# Patient Record
Sex: Female | Born: 1980 | Race: White | Hispanic: No | Marital: Married | Smoking: Never smoker
Health system: Southern US, Community
[De-identification: ages and names within clinical notes are randomized; demographics above are authoritative.]

## PROBLEM LIST (undated history)

## (undated) DIAGNOSIS — R7309 Other abnormal glucose: Secondary | ICD-10-CM

## (undated) DIAGNOSIS — O24419 Gestational diabetes mellitus in pregnancy, unspecified control: Secondary | ICD-10-CM

## (undated) DIAGNOSIS — C50919 Malignant neoplasm of unspecified site of unspecified female breast: Secondary | ICD-10-CM

## (undated) DIAGNOSIS — N159 Renal tubulo-interstitial disease, unspecified: Secondary | ICD-10-CM

## (undated) DIAGNOSIS — D649 Anemia, unspecified: Secondary | ICD-10-CM

## (undated) DIAGNOSIS — T8859XA Other complications of anesthesia, initial encounter: Secondary | ICD-10-CM

## (undated) DIAGNOSIS — E039 Hypothyroidism, unspecified: Secondary | ICD-10-CM

## (undated) DIAGNOSIS — J189 Pneumonia, unspecified organism: Secondary | ICD-10-CM

## (undated) DIAGNOSIS — T4145XA Adverse effect of unspecified anesthetic, initial encounter: Secondary | ICD-10-CM

## (undated) DIAGNOSIS — R112 Nausea with vomiting, unspecified: Secondary | ICD-10-CM

## (undated) DIAGNOSIS — Z9889 Other specified postprocedural states: Secondary | ICD-10-CM

## (undated) DIAGNOSIS — Z923 Personal history of irradiation: Secondary | ICD-10-CM

## (undated) HISTORY — DX: Malignant neoplasm of unspecified site of unspecified female breast: C50.919

## (undated) HISTORY — PX: APPENDECTOMY: SHX54

## (undated) HISTORY — PX: WISDOM TOOTH EXTRACTION: SHX21

## (undated) HISTORY — PX: OTHER SURGICAL HISTORY: SHX169

## (undated) HISTORY — PX: BREAST LUMPECTOMY: SHX2

---
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
## 2007-01-20 HISTORY — PX: PELVIC LAPAROSCOPY: SHX162

## 2017-03-19 ENCOUNTER — Ambulatory Visit: Payer: PRIVATE HEALTH INSURANCE | Admitting: Certified Nurse Midwife

## 2017-03-19 ENCOUNTER — Encounter: Payer: Self-pay | Admitting: Certified Nurse Midwife

## 2017-03-19 VITALS — BP 122/79 | HR 88 | Ht 69.0 in | Wt 215.7 lb

## 2017-03-19 DIAGNOSIS — L680 Hirsutism: Secondary | ICD-10-CM

## 2017-03-19 DIAGNOSIS — N920 Excessive and frequent menstruation with regular cycle: Secondary | ICD-10-CM | POA: Diagnosis not present

## 2017-03-19 DIAGNOSIS — Z8349 Family history of other endocrine, nutritional and metabolic diseases: Secondary | ICD-10-CM

## 2017-03-19 DIAGNOSIS — Z808 Family history of malignant neoplasm of other organs or systems: Secondary | ICD-10-CM | POA: Diagnosis not present

## 2017-03-19 DIAGNOSIS — L659 Nonscarring hair loss, unspecified: Secondary | ICD-10-CM

## 2017-03-19 DIAGNOSIS — Z6831 Body mass index (BMI) 31.0-31.9, adult: Secondary | ICD-10-CM | POA: Diagnosis not present

## 2017-03-19 DIAGNOSIS — L608 Other nail disorders: Secondary | ICD-10-CM

## 2017-03-19 DIAGNOSIS — L853 Xerosis cutis: Secondary | ICD-10-CM | POA: Diagnosis not present

## 2017-03-19 DIAGNOSIS — R6889 Other general symptoms and signs: Secondary | ICD-10-CM

## 2017-03-19 DIAGNOSIS — L709 Acne, unspecified: Secondary | ICD-10-CM

## 2017-03-19 NOTE — Progress Notes (Signed)
Pt would like thyroid blood work done due to several members of her family having thyroid issues. Pt is having heavy cycles and changing tampons and pads every 20-30 mins the first 2 days and then it slows down and lasts 7 days.

## 2017-03-19 NOTE — Patient Instructions (Signed)
Preventive Care 18-39 Years, Female Preventive care refers to lifestyle choices and visits with your health care provider that can promote health and wellness. What does preventive care include?  A yearly physical exam. This is also called an annual well check.  Dental exams once or twice a year.  Routine eye exams. Ask your health care provider how often you should have your eyes checked.  Personal lifestyle choices, including: ? Daily care of your teeth and gums. ? Regular physical activity. ? Eating a healthy diet. ? Avoiding tobacco and drug use. ? Limiting alcohol use. ? Practicing safe sex. ? Taking vitamin and mineral supplements as recommended by your health care provider. What happens during an annual well check? The services and screenings done by your health care provider during your annual well check will depend on your age, overall health, lifestyle risk factors, and family history of disease. Counseling Your health care provider may ask you questions about your:  Alcohol use.  Tobacco use.  Drug use.  Emotional well-being.  Home and relationship well-being.  Sexual activity.  Eating habits.  Work and work Statistician.  Method of birth control.  Menstrual cycle.  Pregnancy history.  Screening You may have the following tests or measurements:  Height, weight, and BMI.  Diabetes screening. This is done by checking your blood sugar (glucose) after you have not eaten for a while (fasting).  Blood pressure.  Lipid and cholesterol levels. These may be checked every 5 years starting at age 66.  Skin check.  Hepatitis C blood test.  Hepatitis B blood test.  Sexually transmitted disease (STD) testing.  BRCA-related cancer screening. This may be done if you have a family history of breast, ovarian, tubal, or peritoneal cancers.  Pelvic exam and Pap test. This may be done every 3 years starting at age 40. Starting at age 59, this may be done every 5  years if you have a Pap test in combination with an HPV test.  Discuss your test results, treatment options, and if necessary, the need for more tests with your health care provider. Vaccines Your health care provider may recommend certain vaccines, such as:  Influenza vaccine. This is recommended every year.  Tetanus, diphtheria, and acellular pertussis (Tdap, Td) vaccine. You may need a Td booster every 10 years.  Varicella vaccine. You may need this if you have not been vaccinated.  HPV vaccine. If you are 69 or younger, you may need three doses over 6 months.  Measles, mumps, and rubella (MMR) vaccine. You may need at least one dose of MMR. You may also need a second dose.  Pneumococcal 13-valent conjugate (PCV13) vaccine. You may need this if you have certain conditions and were not previously vaccinated.  Pneumococcal polysaccharide (PPSV23) vaccine. You may need one or two doses if you smoke cigarettes or if you have certain conditions.  Meningococcal vaccine. One dose is recommended if you are age 27-21 years and a first-year college student living in a residence hall, or if you have one of several medical conditions. You may also need additional booster doses.  Hepatitis A vaccine. You may need this if you have certain conditions or if you travel or work in places where you may be exposed to hepatitis A.  Hepatitis B vaccine. You may need this if you have certain conditions or if you travel or work in places where you may be exposed to hepatitis B.  Haemophilus influenzae type b (Hib) vaccine. You may need this if  you have certain risk factors.  Talk to your health care provider about which screenings and vaccines you need and how often you need them. This information is not intended to replace advice given to you by your health care provider. Make sure you discuss any questions you have with your health care provider. Document Released: 03/03/2001 Document Revised: 09/25/2015  Document Reviewed: 11/06/2014 Elsevier Interactive Patient Education  [employer].

## 2017-03-23 NOTE — Progress Notes (Signed)
GYN ENCOUNTER NOTE  Subjective:       Gabriela Evans is a 37 y.o. G2P1000 female here to establish care and for gynecologic evaluation of the following issues:  1. Heavy periods 2. Hirsutism 3. Pitting fingernails 4. Thinning hair 5. Acne 6. Dry skin 7. Cold feeling after eating 8. Sore joints  She has noticed this symptoms progressing over the last few months. Concerned due to family history. Which includes: thyroid cancer, Hashimoto thyroiditis, hypothyroidism, hyperthyroidism, prostate cancer, and melanoma.   Denies difficulty breathing or respiratory distress, chest pain, abdominal pain, dysuria and leg pain or swelling.   Requests lab work today including thyroid antibody.    Gynecologic History  Patient's last menstrual period was 02/24/2017.  Last Pap: 08/2016. Results were: normal  Obstetric History  OB History  Gravida Para Term Preterm AB Living  2 2 1         SAB TAB Ectopic Multiple Live Births               # Outcome Date GA Lbr Len/2nd Weight Sex Delivery Anes PTL Lv  2 Term 2011    F CS-Unspec     1 Para 2009    M CS-Unspec         History reviewed. No pertinent past medical history.  Past Surgical History:  Procedure Laterality Date  . CESAREAN SECTION      Current Outpatient Medications on File Prior to Visit  Medication Sig Dispense Refill  . cholecalciferol (VITAMIN D) 1000 units tablet Take 1,000 Units by mouth daily.    . ferrous sulfate 325 (65 FE) MG tablet Take 325 mg by mouth daily with breakfast.    . Multiple Vitamins-Minerals (MULTIVITAMIN WITH MINERALS) tablet Take 1 tablet by mouth daily.     No current facility-administered medications on file prior to visit.     Not on File  Social History   Socioeconomic History  . Marital status: Divorced    Spouse name: Not on file  . Number of children: Not on file  . Years of education: Not on file  . Highest education level: Not on file  Social Needs  . Financial resource strain:  Not on file  . Food insecurity - worry: Not on file  . Food insecurity - inability: Not on file  . Transportation needs - medical: Not on file  . Transportation needs - non-medical: Not on file  Occupational History  . Not on file  Tobacco Use  . Smoking status: Never Smoker  . Smokeless tobacco: Never Used  Substance and Sexual Activity  . Alcohol use: Yes    Frequency: Never    Comment: occas  . Drug use: No  . Sexual activity: No    Birth control/protection: None  Other Topics Concern  . Not on file  Social History Narrative  . Not on file    Family History  Problem Relation Age of Onset  . Diabetes Mother   . Thyroid disease Father   . Diabetes Father   . Thyroid disease Sister   . Thyroid disease Maternal Aunt   . Cancer Paternal Aunt     The following portions of the patient's history were reviewed and updated as appropriate: allergies, current medications, past family history, past medical history, past social history, past surgical history and problem list.  Review of Systems  Review of Systems - Negative except as noted above.  History obtained from the patient.   Objective:   BP 122/79  Pulse 88   Ht 5\' 9"  (1.753 m)   Wt 215 lb 11.2 oz (97.8 kg)   LMP 02/24/2017   BMI 31.85 kg/m   CONSTITUTIONAL: Well-developed, well-nourished female in no acute distress.   HENT:  Normocephalic, atraumatic.   NECK: Normal range of motion, supple, no masses.   Normal thyroid.   [place]: Alert and oriented to person, place, and time.   PSYCHIATRIC: Normal mood and affect. Normal behavior. Normal judgment and thought content.  CARDIOVASCULAR: normal rate and regular rhythm.  RESPIRATORY: Normal respiratory effort, chest expands symmetrically. Lungs are clear to auscultation, no crackles or wheezes.   Assessment:   1. Menorrhagia with regular cycle  - DHEA-sulfate - Testosterone, Free, Total, SHBG - Progesterone - Thyroid Panel With TSH - Vitamin D  1,25 dihydroxy - FSH/LH - Estradiol - Hemoglobin A1c - Prolactin - CBC - Thyroid peroxidase antibody  2. Family history of thyroid cancer  - DHEA-sulfate - Testosterone, Free, Total, SHBG - Progesterone - Thyroid Panel With TSH - Vitamin D 1,25 dihydroxy - FSH/LH - Estradiol - Hemoglobin A1c - Prolactin - CBC - Thyroid peroxidase antibody  3. Family history of Hashimoto thyroiditis  - DHEA-sulfate - Testosterone, Free, Total, SHBG - Progesterone - Thyroid Panel With TSH - Vitamin D 1,25 dihydroxy - FSH/LH - Estradiol - Hemoglobin A1c - Prolactin - CBC - Thyroid peroxidase antibody  4. Family history of hypothyroidism  - DHEA-sulfate - Testosterone, Free, Total, SHBG - Progesterone - Thyroid Panel With TSH - Vitamin D 1,25 dihydroxy - FSH/LH - Estradiol - Hemoglobin A1c - Prolactin - CBC - Thyroid peroxidase antibody  5. Family history of hyperthyroidism  - DHEA-sulfate - Testosterone, Free, Total, SHBG - Progesterone - Thyroid Panel With TSH - Vitamin D 1,25 dihydroxy - FSH/LH - Estradiol - Hemoglobin A1c - Prolactin - CBC - Thyroid peroxidase antibody  6. Hirsutism  - DHEA-sulfate - Testosterone, Free, Total, SHBG - Progesterone - Thyroid Panel With TSH - Vitamin D 1,25 dihydroxy - FSH/LH - Estradiol - Hemoglobin A1c - Prolactin - CBC - Thyroid peroxidase antibody  7. Pitting of nails  - DHEA-sulfate - Testosterone, Free, Total, SHBG - Progesterone - Thyroid Panel With TSH - Vitamin D 1,25 dihydroxy - FSH/LH - Estradiol - Hemoglobin A1c - Prolactin - CBC - Thyroid peroxidase antibody  8. Thinning hair  - DHEA-sulfate - Testosterone, Free, Total, SHBG - Progesterone - Thyroid Panel With TSH - Vitamin D 1,25 dihydroxy - FSH/LH - Estradiol - Hemoglobin A1c - Prolactin - CBC - Thyroid peroxidase antibody  9. Acne, unspecified acne type  - DHEA-sulfate - Testosterone, Free, Total, SHBG - Progesterone - Thyroid  Panel With TSH - Vitamin D 1,25 dihydroxy - FSH/LH - Estradiol - Hemoglobin A1c - Prolactin - CBC - Thyroid peroxidase antibody  10. Dry skin  - DHEA-sulfate - Testosterone, Free, Total, SHBG - Progesterone - Thyroid Panel With TSH - Vitamin D 1,25 dihydroxy - FSH/LH - Estradiol - Hemoglobin A1c - Prolactin - CBC - Thyroid peroxidase antibody  11. Cold feeling  - DHEA-sulfate - Testosterone, Free, Total, SHBG - Progesterone - Thyroid Panel With TSH - Vitamin D 1,25 dihydroxy - FSH/LH - Estradiol - Hemoglobin A1c - Prolactin - CBC - Thyroid peroxidase antibody  12. BMI 31.0-31.9,adult  - DHEA-sulfate - Testosterone, Free, Total, SHBG - Progesterone - Thyroid Panel With TSH - Vitamin D 1,25 dihydroxy - FSH/LH - Estradiol - Hemoglobin A1c - Prolactin - CBC - Thyroid peroxidase antibody  Plan:   Labs: see  orders. Will contact patient with results.   Reviewed red flag symptoms and when to call.   RTC as needed.    Lara Mulch Mariell Nester,CNM [facility]   A total of 20 minutes were spent face-to-face with the patient during the encounter with greater than 50% dealing with counseling and coordination of care.

## 2017-03-24 LAB — PROGESTERONE: PROGESTERONE: 12.6 ng/mL

## 2017-03-24 LAB — CBC
HEMATOCRIT: 37.5 % (ref 34.0–46.6)
HEMOGLOBIN: 12.3 g/dL (ref 11.1–15.9)
MCH: 28.7 pg (ref 26.6–33.0)
MCHC: 32.8 g/dL (ref 31.5–35.7)
MCV: 87 fL (ref 79–97)
Platelets: 283 10*3/uL (ref 150–379)
RBC: 4.29 x10E6/uL (ref 3.77–5.28)
RDW: 13.3 % (ref 12.3–15.4)
WBC: 8.3 10*3/uL (ref 3.4–10.8)

## 2017-03-24 LAB — VITAMIN D 1,25 DIHYDROXY
VITAMIN D 1, 25 (OH) TOTAL: 61 pg/mL
Vitamin D2 1, 25 (OH)2: <10 pg/mL
Vitamin D3 1, 25 (OH)2: 55 pg/mL

## 2017-03-24 LAB — FSH/LH
FSH: 3.4 m[IU]/mL
LH: 2.4 m[IU]/mL

## 2017-03-24 LAB — THYROID PEROXIDASE ANTIBODY: THYROID PEROXIDASE ANTIBODY: 7 [IU]/mL (ref 0–34)

## 2017-03-24 LAB — HEMOGLOBIN A1C
Est. average glucose Bld gHb Est-mCnc: 111 mg/dL
HEMOGLOBIN A1C: 5.5 % (ref 4.8–5.6)

## 2017-03-24 LAB — THYROID PANEL WITH TSH
Free Thyroxine Index: 1.4 (ref 1.2–4.9)
T3 Uptake Ratio: 21 % — ABNORMAL LOW (ref 24–39)
T4, Total: 6.7 ug/dL (ref 4.5–12.0)
TSH: 3.14 u[IU]/mL (ref 0.450–4.500)

## 2017-03-24 LAB — PROLACTIN: PROLACTIN: 6.4 ng/mL (ref 4.8–23.3)

## 2017-03-24 LAB — ESTRADIOL: ESTRADIOL: 122.6 pg/mL

## 2017-03-24 LAB — TESTOSTERONE, FREE, TOTAL, SHBG
Sex Hormone Binding: 54.8 nmol/L (ref 24.6–122.0)
TESTOSTERONE FREE: 0.7 pg/mL (ref 0.0–4.2)
Testosterone: 4 ng/dL — ABNORMAL LOW (ref 8–48)

## 2017-03-24 LAB — DHEA-SULFATE: DHEA-SO4: 102.3 ug/dL (ref 84.8–378.0)

## 2017-03-30 ENCOUNTER — Encounter: Payer: Self-pay | Admitting: Certified Nurse Midwife

## 2017-05-08 ENCOUNTER — Encounter: Payer: Self-pay | Admitting: Certified Nurse Midwife

## 2017-05-11 ENCOUNTER — Other Ambulatory Visit: Payer: Self-pay | Admitting: Certified Nurse Midwife

## 2017-05-11 DIAGNOSIS — N920 Excessive and frequent menstruation with regular cycle: Secondary | ICD-10-CM

## 2017-05-11 NOTE — Telephone Encounter (Signed)
Please contact patient to schedule [place] and results review.

## 2017-07-20 ENCOUNTER — Encounter: Payer: Self-pay | Admitting: Certified Nurse Midwife

## 2017-07-26 ENCOUNTER — Telehealth: Payer: Self-pay | Admitting: Certified Nurse Midwife

## 2017-07-26 NOTE — Telephone Encounter (Signed)
The patient called and stated that she needs to speak with her nurse, No other information disclosed. Please advise.

## 2017-08-10 ENCOUNTER — Encounter: Payer: PRIVATE HEALTH INSURANCE | Admitting: Obstetrics and Gynecology

## 2017-08-27 ENCOUNTER — Encounter: Payer: Self-pay | Admitting: Certified Nurse Midwife

## 2017-08-30 ENCOUNTER — Other Ambulatory Visit: Payer: PRIVATE HEALTH INSURANCE

## 2017-09-03 ENCOUNTER — Telehealth: Payer: Self-pay | Admitting: Certified Nurse Midwife

## 2017-09-03 NOTE — Telephone Encounter (Signed)
The patient called and stated that she would like to u/s order sent to [facility] imaging so she can get the scan done at her employer at no cost. No other information was disclosed. Please advise.

## 2017-09-06 ENCOUNTER — Encounter: Payer: PRIVATE HEALTH INSURANCE | Admitting: Certified Nurse Midwife

## 2017-09-06 ENCOUNTER — Other Ambulatory Visit: Payer: Self-pay

## 2017-09-06 ENCOUNTER — Other Ambulatory Visit: Payer: PRIVATE HEALTH INSURANCE

## 2017-09-06 DIAGNOSIS — N920 Excessive and frequent menstruation with regular cycle: Secondary | ICD-10-CM

## 2017-09-14 ENCOUNTER — Ambulatory Visit
Admission: RE | Admit: 2017-09-14 | Discharge: 2017-09-14 | Disposition: A | Discharge status: Home / Self Care | Payer: PRIVATE HEALTH INSURANCE | Source: Ambulatory Visit | Attending: Certified Nurse Midwife | Admitting: Certified Nurse Midwife

## 2017-09-14 DIAGNOSIS — N920 Excessive and frequent menstruation with regular cycle: Secondary | ICD-10-CM

## 2017-09-21 ENCOUNTER — Encounter: Payer: Self-pay | Admitting: Certified Nurse Midwife

## 2017-09-23 ENCOUNTER — Other Ambulatory Visit: Payer: Self-pay

## 2017-09-23 MED ORDER — NORETHIN-ETH ESTRAD-FE BIPHAS 1 MG-10 MCG / 10 MCG PO TABS: 1.0000 | ORAL_TABLET | Freq: Every day | ORAL | 2 refills | Status: DC

## 2017-09-23 NOTE — Telephone Encounter (Signed)
Please send Rx Lo loestrin, three packs at a time. Give discount card if needed. Thanks, JML

## 2017-12-05 IMAGING — US US PELVIS COMPLETE TRANSABD/TRANSVAG
1 series · 14 of 25 positions shown · non-contrast
Comparison: None

CLINICAL DATA: Menorrhagia. Regular cycles. History of IUD
perforation and 2 C-section surgeries. LMP [DATE].



[Series 1: us pelvis complete transabd/transvag · 0.30mm/px · 14 of 87 slices shown]
[im 1/87]
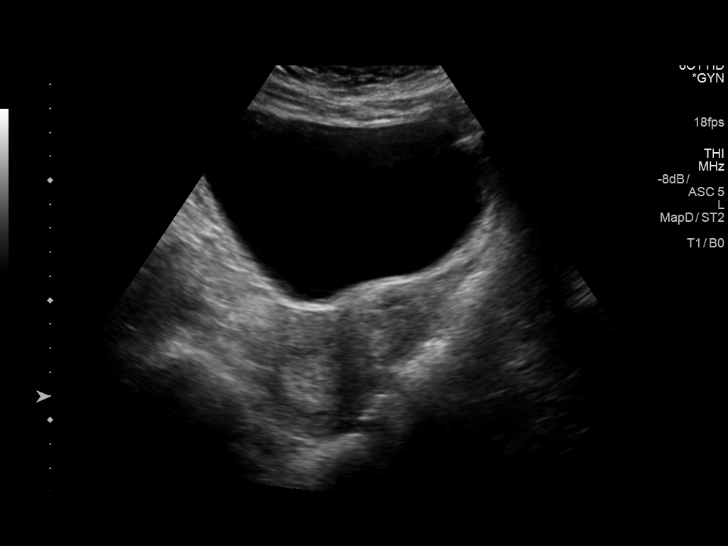
[im 8/87]
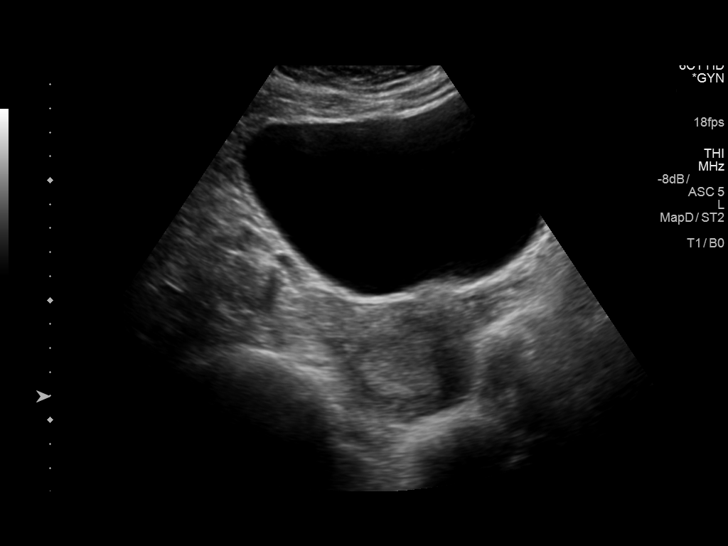
[im 15/87]
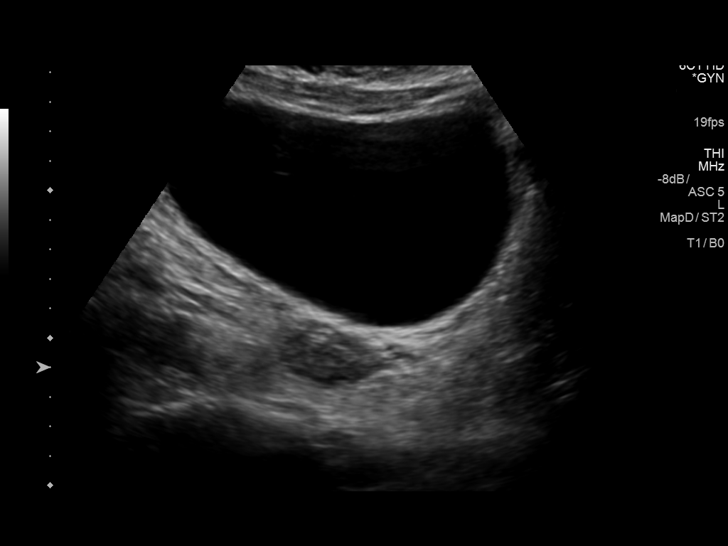
[im 22/87]
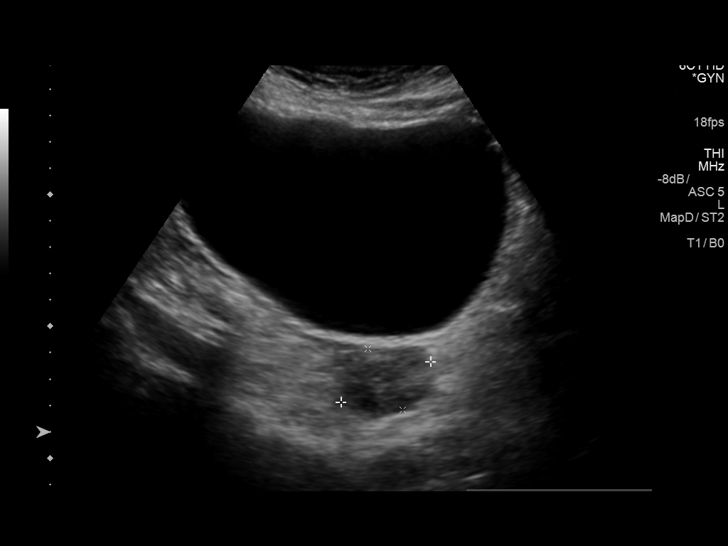
[im 29/87]
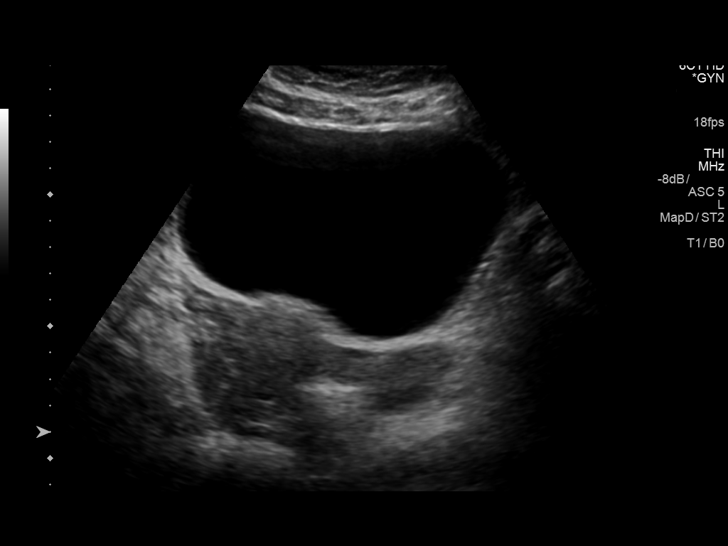
[im 33/87]
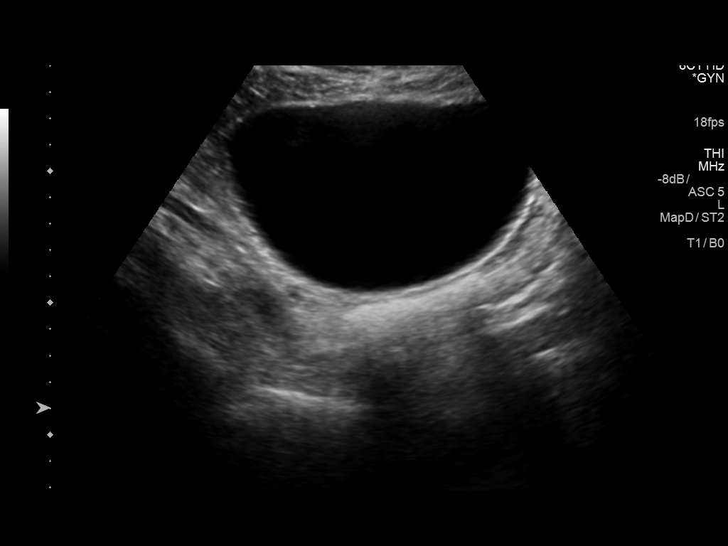
[im 40/87]
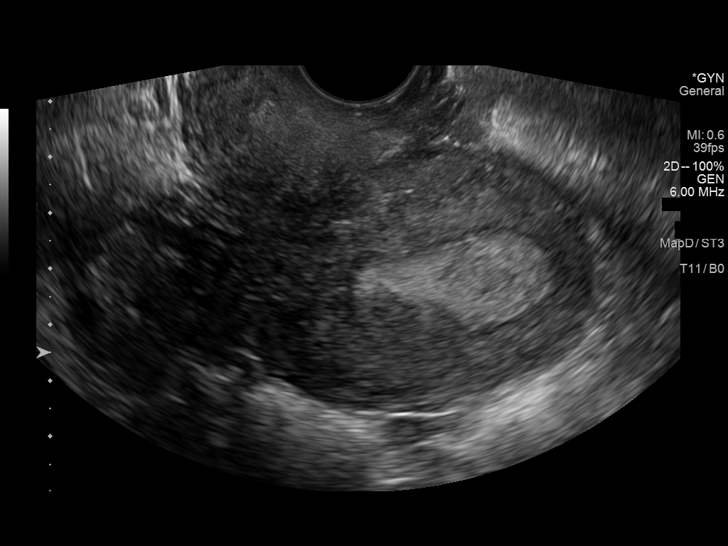
[im 47/87]
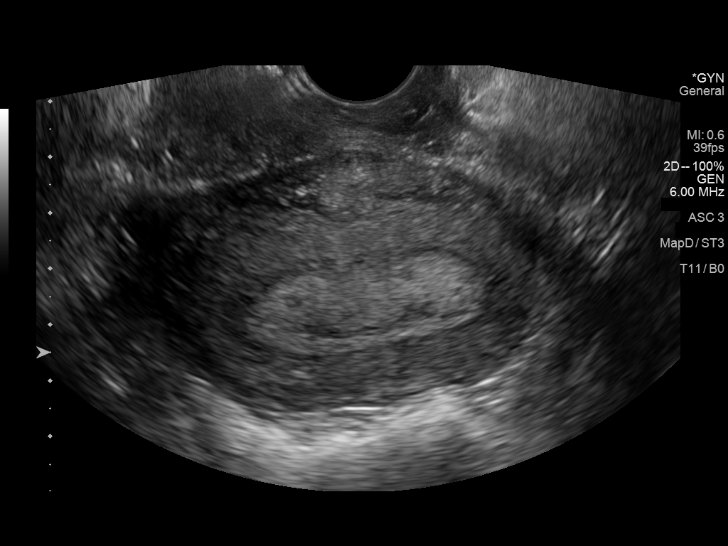
[im 54/87]
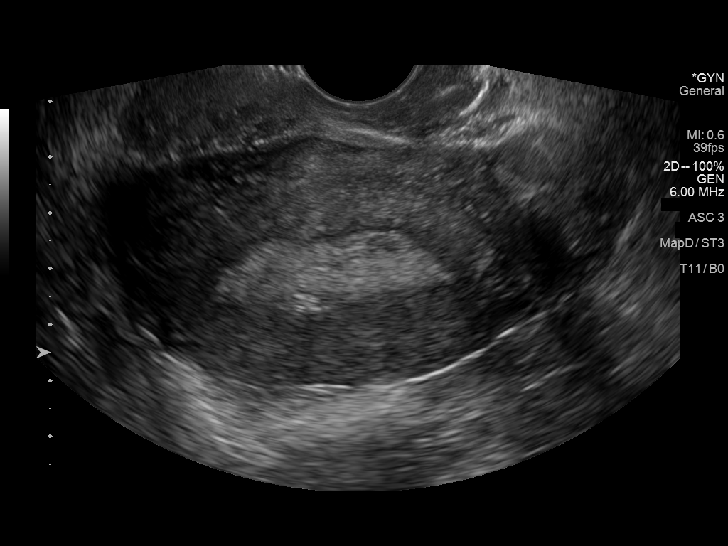
[im 58/87]
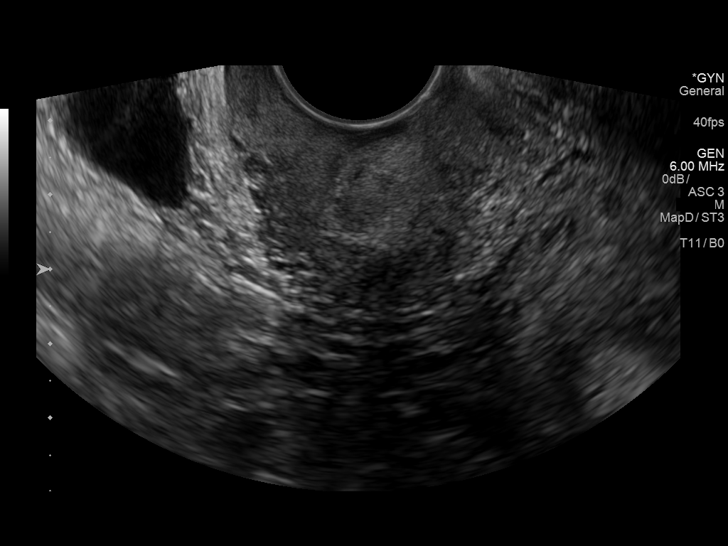
[im 65/87]
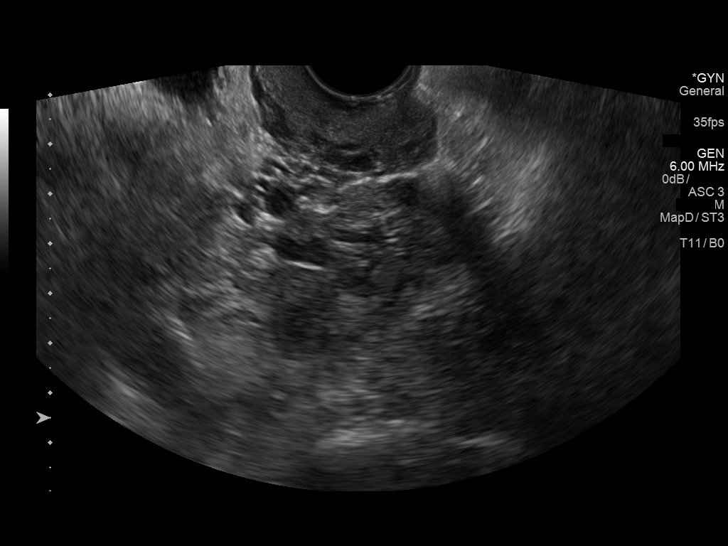
[im 72/87]
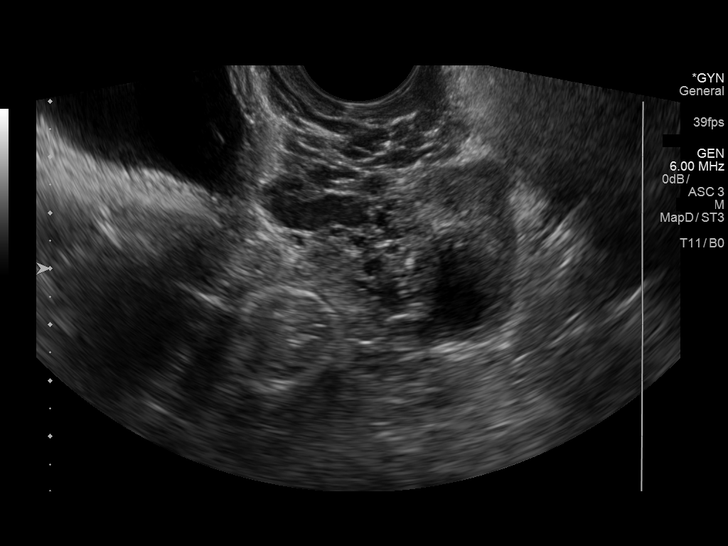
[im 79/87]
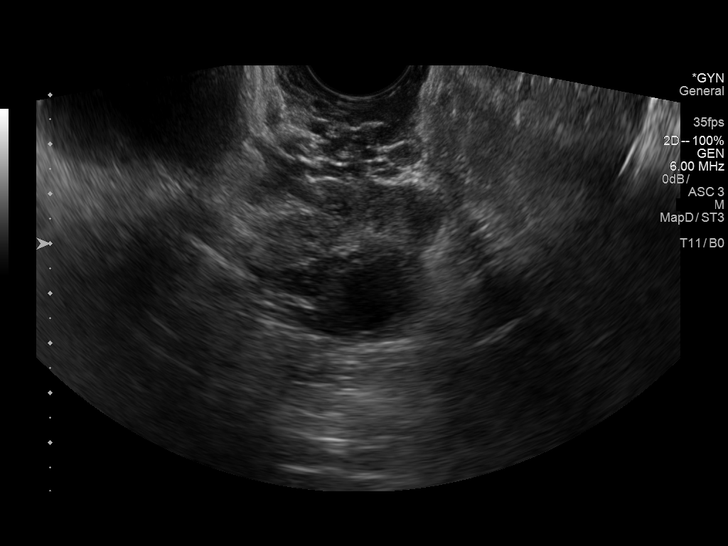
[im 87/87]
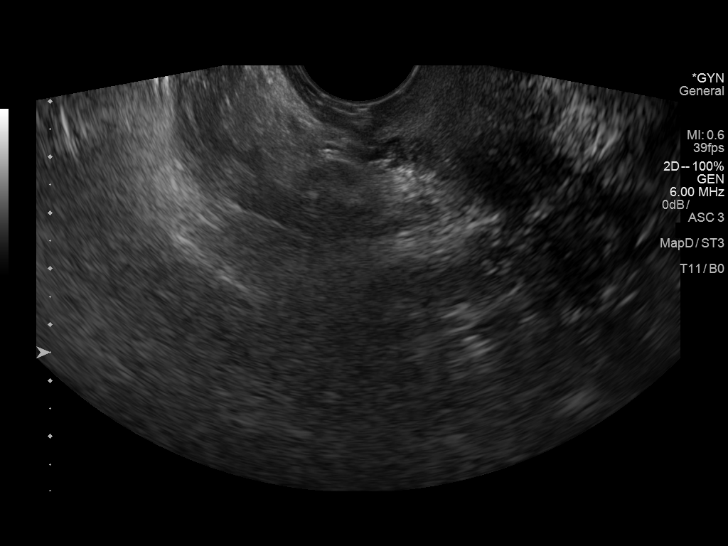

[14 of 25 positions shown; findings below may reference images not displayed]

FINDINGS: Uterus

Measurements: 8.0 x 4.7 x 6.6 centimeter. There is the uterus is
retroflexed. No mass.

Endometrium

Thickness: 15 millimeters and mildly heterogeneous. No discrete mass
identified..

Right ovary

Measurements: 4.5 x 3.0 x 3.2 centimeters. Normal appearance/no
adnexal mass.

Left ovary

Measurements: 3.1 x 2.5 x 3.4 centimeters. Normal appearance/no
adnexal mass.

Other findings

No abnormal free fluid.
IMPRESSION: 1. Retroflexed uterus.
2. Normal thickness of the endometrium.
3. No adnexal mass.

## 2017-12-27 ENCOUNTER — Encounter: Payer: Self-pay | Admitting: Certified Nurse Midwife

## 2018-01-10 ENCOUNTER — Ambulatory Visit: Payer: PRIVATE HEALTH INSURANCE | Admitting: Psychology

## 2018-01-10 DIAGNOSIS — F411 Generalized anxiety disorder: Secondary | ICD-10-CM

## 2018-01-24 ENCOUNTER — Ambulatory Visit: Payer: PRIVATE HEALTH INSURANCE | Admitting: Psychology

## 2018-01-24 DIAGNOSIS — F411 Generalized anxiety disorder: Secondary | ICD-10-CM | POA: Diagnosis not present

## 2018-02-08 ENCOUNTER — Ambulatory Visit: Payer: PRIVATE HEALTH INSURANCE | Admitting: Psychology

## 2018-02-08 DIAGNOSIS — F411 Generalized anxiety disorder: Secondary | ICD-10-CM

## 2018-02-13 ENCOUNTER — Ambulatory Visit
Admission: EM | Admit: 2018-02-13 | Discharge: 2018-02-13 | Discharge status: Against Medical Advice | Payer: PRIVATE HEALTH INSURANCE

## 2018-02-13 NOTE — ED Triage Notes (Signed)
Pt cut her left index finger today while making a salad did do compression and elevation and it was still slightly bleeding. The cut is right against her nail. States her last tdap was within the last year or 2

## 2018-02-22 ENCOUNTER — Ambulatory Visit: Payer: PRIVATE HEALTH INSURANCE | Admitting: Psychology

## 2018-02-22 DIAGNOSIS — F411 Generalized anxiety disorder: Secondary | ICD-10-CM

## 2018-03-08 ENCOUNTER — Ambulatory Visit: Payer: Self-pay | Admitting: Gynecology

## 2018-03-21 ENCOUNTER — Ambulatory Visit: Payer: PRIVATE HEALTH INSURANCE | Admitting: Psychology

## 2018-03-21 ENCOUNTER — Encounter: Payer: Self-pay | Admitting: Women's Health

## 2018-03-21 ENCOUNTER — Ambulatory Visit: Payer: PRIVATE HEALTH INSURANCE | Admitting: Women's Health

## 2018-03-21 VITALS — BP 122/80 | Ht 69.0 in | Wt 207.0 lb

## 2018-03-21 DIAGNOSIS — Z113 Encounter for screening for infections with a predominantly sexual mode of transmission: Secondary | ICD-10-CM | POA: Diagnosis not present

## 2018-03-21 DIAGNOSIS — N920 Excessive and frequent menstruation with regular cycle: Secondary | ICD-10-CM | POA: Diagnosis not present

## 2018-03-21 NOTE — Patient Instructions (Signed)
Endometrial Ablation Endometrial ablation is a procedure that destroys the thin inner layer of the lining of the uterus (endometrium). This procedure may be done:  To stop heavy periods.  To stop bleeding that is causing anemia.  To control irregular bleeding.  To treat bleeding caused by small tumors (fibroids) in the endometrium. This procedure is often an alternative to major surgery, such as removal of the uterus and cervix (hysterectomy). As a result of this procedure:  You may not be able to have children. However, if you are premenopausal (you have not gone through menopause): ? You may still have a small chance of getting pregnant. ? You will need to use a reliable method of birth control after the procedure to prevent pregnancy.  You may stop having a menstrual period, or you may have only a small amount of bleeding during your period. Menstruation may return several years after the procedure. Tell a health care provider about:  Any allergies you have.  All medicines you are taking, including vitamins, herbs, eye drops, creams, and over-the-counter medicines.  Any problems you or family members have had with the use of anesthetic medicines.  Any blood disorders you have.  Any surgeries you have had.  Any medical conditions you have. What are the risks? Generally, this is a safe procedure. However, problems may occur, including:  A hole (perforation) in the uterus or bowel.  Infection of the uterus, bladder, or vagina.  Bleeding.  Damage to other structures or organs.  An air bubble in the lung (air embolus).  Problems with pregnancy after the procedure.  Failure of the procedure.  Decreased ability to diagnose cancer in the endometrium. What happens before the procedure?  You will have tests of your endometrium to make sure there are no pre-cancerous cells or cancer cells present.  You may have an ultrasound of the uterus.  You may be given medicines to  thin the endometrium.  Ask your health care provider about: ? Changing or stopping your regular medicines. This is especially important if you take diabetes medicines or blood thinners. ? Taking medicines such as aspirin and ibuprofen. These medicines can thin your blood. Do not take these medicines before your procedure if your doctor tells you not to.  Plan to have someone take you home from the hospital or clinic. What happens during the procedure?   You will lie on an exam table with your feet and legs supported as in a pelvic exam.  To lower your risk of infection: ? Your health care team will wash or sanitize their hands and put on germ-free (sterile) gloves. ? Your genital area will be washed with soap.  An IV tube will be inserted into one of your veins.  You will be given a medicine to help you relax (sedative).  A surgical instrument with a light and camera (resectoscope) will be inserted into your vagina and moved into your uterus. This allows your surgeon to see inside your uterus.  Endometrial tissue will be removed using one of the following methods: ? Radiofrequency. This method uses a radiofrequency-alternating electric current to remove the endometrium. ? Cryotherapy. This method uses extreme cold to freeze the endometrium. ? Heated-free liquid. This method uses a heated saltwater (saline) solution to remove the endometrium. ? Microwave. This method uses high-energy microwaves to heat up the endometrium and remove it. ? Thermal balloon. This method involves inserting a catheter with a balloon tip into the uterus. The balloon tip is filled with   method uses a heated saltwater (saline) solution to remove the endometrium.  ? Microwave. This method uses high-energy microwaves to heat up the endometrium and remove it.  ? Thermal balloon. This method involves inserting a catheter with a balloon tip into the uterus. The balloon tip is filled with heated fluid to remove the endometrium.  The procedure may vary among health care providers and hospitals.  What happens after the procedure?  · Your blood pressure, heart rate, breathing rate, and blood oxygen level will be monitored until the medicines you were given have worn off.  · As tissue healing occurs, you may notice  vaginal bleeding for 4-6 weeks after the procedure. You may also experience:  ? Cramps.  ? Thin, watery vaginal discharge that is light pink or brown in color.  ? A need to urinate more frequently than usual.  ? Nausea.  · Do not drive for 24 hours if you were given a sedative.  · Do not have sex or insert anything into your vagina until your health care provider approves.  Summary  · Endometrial ablation is done to treat the many causes of heavy menstrual bleeding.  · The procedure may be done only after medications have been tried to control the bleeding.  · Plan to have someone take you home from the hospital or clinic.  This information is not intended to replace advice given to you by your health care provider. Make sure you discuss any questions you have with your health care provider.  Document Released: 11/15/2003 Document Revised: 06/22/2017 Document Reviewed: 01/23/2016  Elsevier Interactive Patient Education © 2019 Elsevier Inc.

## 2018-03-21 NOTE — Progress Notes (Signed)
Gabriela Evans 1980/08/22 [id number]    History:    Presents for new patient to discuss endometrial ablation.  Regular 5 to 6-day cycles with extremely heavy bleeding using 12-16 tampons with pads daily.  States periods have been extremely heavy since starting at age 38.  Has had increased passage of large clots in the past few years.  History of 2 C-sections.  Had a perforated uterus with Mirena IUD.  Did not feel well on OCs with mood changes.  Does not desire any more children, son is 20, daughter 79 both doing well.  Reports normal Pap history last Pap 2018.  New partner in the past few months.  Numerous normal labs recently.  2019 ultrasound shows normal ovaries, retroflexed uterus.  Past medical history, past surgical history, family history and social history were all reviewed and documented in the EPIC chart.  Works at [employer].  Parents healthy.  Mother prediabetic.  ROS:  A ROS was performed and pertinent positives and negatives are included.  Exam:  Vitals:   03/21/18 1205  BP: 122/80  Weight: 207 lb (93.9 kg)  Height: 5\' 9"  (1.753 m)   Body mass index is 30.57 kg/m.   General appearance:  Normal Thyroid:  Symmetrical, normal in size, without palpable masses or nodularity. Respiratory  Auscultation:  Clear without wheezing or rhonchi Cardiovascular  Auscultation:  Regular rate, without rubs, murmurs or gallops  Edema/varicosities:  Not grossly evident Abdominal  Soft,nontender, without masses, guarding or rebound.  Liver/spleen:  No organomegaly noted  Hernia:  None appreciated  Skin  Inspection:  Grossly normal   Breasts: Examined lying and sitting.     Right: Without masses, retractions, discharge or axillary adenopathy.     Left: Without masses, retractions, discharge or axillary adenopathy. Gentitourinary   Inguinal/mons:  Normal without inguinal adenopathy  External genitalia:  Normal  BUS/Urethra/Skene's glands:  Normal  Vagina:  Normal  Cervix:   Normal  Uterus:  normal in size, shape and contour.  Midline and mobile  Adnexa/parametria:     Rt: Without masses or tenderness.   Lt: Without masses or tenderness.  Anus and perineum: Normal  Digital rectal exam: Normal sphincter tone without palpated masses or tenderness  Assessment/Plan:  38 y.o. D WF G2, P2 for new patient with menorrhagia.  Monthly 5-day cycle with menorrhagia STD screen Obesity  Plan: Endometrial ablation reviewed, schedule sonohysterogram with Dr. Phineas Real.  GC/chlamydia, HIV, RPR, von Willebrand's.  Contraception reviewed, declines will continue condoms.  Normal Pap 2018, new screening guidelines reviewed.  Aware of need to decrease calories/carbs.   [place], 1:08 PM 03/21/2018

## 2018-03-22 LAB — C. TRACHOMATIS/N. GONORRHOEAE RNA
C. TRACHOMATIS RNA, TMA: NOT DETECTED
N. gonorrhoeae RNA, TMA: NOT DETECTED

## 2018-03-22 LAB — RPR: RPR Ser Ql: NONREACTIVE

## 2018-03-22 LAB — HIV ANTIBODY (ROUTINE TESTING W REFLEX): HIV: NONREACTIVE

## 2018-03-24 LAB — VON WILLEBRAND PANEL
Factor-VIII Activity: 126 % normal (ref 50–180)
Ristocetin Co-Factor: 95 % normal (ref 42–200)
Von Willebrand Antigen, Plasma: 122 % (ref 50–217)
aPTT: 27 s (ref 22–34)

## 2018-04-01 ENCOUNTER — Ambulatory Visit: Payer: PRIVATE HEALTH INSURANCE | Admitting: Psychology

## 2018-04-05 ENCOUNTER — Other Ambulatory Visit: Payer: Self-pay | Admitting: Gynecology

## 2018-04-05 DIAGNOSIS — N939 Abnormal uterine and vaginal bleeding, unspecified: Secondary | ICD-10-CM

## 2018-04-14 ENCOUNTER — Other Ambulatory Visit: Payer: PRIVATE HEALTH INSURANCE

## 2018-04-14 ENCOUNTER — Ambulatory Visit: Payer: PRIVATE HEALTH INSURANCE | Admitting: Gynecology

## 2018-04-22 ENCOUNTER — Ambulatory Visit: Payer: PRIVATE HEALTH INSURANCE | Admitting: Psychology

## 2018-04-22 ENCOUNTER — Ambulatory Visit (INDEPENDENT_AMBULATORY_CARE_PROVIDER_SITE_OTHER): Payer: PRIVATE HEALTH INSURANCE | Admitting: Psychology

## 2018-04-22 DIAGNOSIS — F411 Generalized anxiety disorder: Secondary | ICD-10-CM

## 2018-05-09 ENCOUNTER — Ambulatory Visit (INDEPENDENT_AMBULATORY_CARE_PROVIDER_SITE_OTHER): Payer: PRIVATE HEALTH INSURANCE | Admitting: Psychology

## 2018-05-09 DIAGNOSIS — F411 Generalized anxiety disorder: Secondary | ICD-10-CM

## 2018-05-23 ENCOUNTER — Ambulatory Visit (INDEPENDENT_AMBULATORY_CARE_PROVIDER_SITE_OTHER): Payer: PRIVATE HEALTH INSURANCE | Admitting: Psychology

## 2018-05-23 DIAGNOSIS — F411 Generalized anxiety disorder: Secondary | ICD-10-CM

## 2018-06-06 ENCOUNTER — Ambulatory Visit: Payer: PRIVATE HEALTH INSURANCE | Admitting: Psychology

## 2018-06-16 ENCOUNTER — Ambulatory Visit (INDEPENDENT_AMBULATORY_CARE_PROVIDER_SITE_OTHER): Payer: PRIVATE HEALTH INSURANCE | Admitting: Psychology

## 2018-06-16 DIAGNOSIS — F411 Generalized anxiety disorder: Secondary | ICD-10-CM

## 2018-07-01 ENCOUNTER — Ambulatory Visit (INDEPENDENT_AMBULATORY_CARE_PROVIDER_SITE_OTHER): Payer: PRIVATE HEALTH INSURANCE | Admitting: Psychology

## 2018-07-01 DIAGNOSIS — F411 Generalized anxiety disorder: Secondary | ICD-10-CM

## 2018-07-03 ENCOUNTER — Encounter: Payer: Self-pay | Admitting: Women's Health

## 2018-07-18 ENCOUNTER — Ambulatory Visit: Payer: PRIVATE HEALTH INSURANCE | Admitting: Psychology

## 2018-07-26 ENCOUNTER — Other Ambulatory Visit: Payer: Self-pay

## 2018-07-27 ENCOUNTER — Ambulatory Visit (INDEPENDENT_AMBULATORY_CARE_PROVIDER_SITE_OTHER): Payer: PRIVATE HEALTH INSURANCE

## 2018-07-27 ENCOUNTER — Ambulatory Visit (INDEPENDENT_AMBULATORY_CARE_PROVIDER_SITE_OTHER): Payer: PRIVATE HEALTH INSURANCE | Admitting: Gynecology

## 2018-07-27 ENCOUNTER — Encounter: Payer: Self-pay | Admitting: Gynecology

## 2018-07-27 VITALS — BP 122/80

## 2018-07-27 DIAGNOSIS — N920 Excessive and frequent menstruation with regular cycle: Secondary | ICD-10-CM | POA: Diagnosis not present

## 2018-07-27 DIAGNOSIS — N939 Abnormal uterine and vaginal bleeding, unspecified: Secondary | ICD-10-CM

## 2018-07-27 NOTE — Patient Instructions (Signed)
Office will call you with biopsy results 

## 2018-07-27 NOTE — Progress Notes (Signed)
    Faiga Stones 01-Aug-1980 [id number]        38 y.o.  G2P1002 presents for sonohysterogram.  Has a history of heavy menses for years although they seem to have accelerated over the past 6 months.  Menses last 7 days with double protection and bleedthrough episodes.    Past medical history,surgical history, problem list, medications, allergies, family history and social history were all reviewed and documented in the EPIC chart.  Directed ROS with pertinent positives and negatives documented in the history of present illness/assessment and plan.  Exam: [place] assistant General appearance:  Normal Abdomen soft nontender without masses guarding rebound Pelvic external BUS vagina normal.  Cervix normal.  Uterus normal size midline mobile nontender.  Adnexa without masses or tenderness.  Ultrasound transvaginal shows uterus normal size and echotexture.  Endometrial echo 6.7 mm with echogenic focus measuring 16 x 4 mm.  Right and left ovaries normal.  Cul-de-sac negative.  Sonohysterogram performed, sterile technique, easy catheter introduction, good distention with posterior filling defect 16 x 10 x 9 mm.  Endometrial sample taken.  Patient tolerated well.  Assessment/Plan:  38 y.o. G2P1002 with menorrhagia.  Ultrasound suggests endometrial polyp.  Endometrial biopsy taken and patient will follow-up for these results.  We discussed various scenarios and options.  Currently not sexually active.  Options for hysteroscopy with resection of the polyp and monitoring cycles afterwards versus resection of the polyp and ablation at the same time accepting the risk of atypia in the polyp requiring follow-up hysterectomy, tubal sterilization also at the same time for assured contraception, hysterectomy.  She is a history of 2 cesarean sections which historically sound like low transverse as she was planning trial of labor with second pregnancy but infant was macrosomic and they elected for repeat C-section.   Also has a history of perforation with IUD.  It was placed and apparently did well initially but then started bleeding heavily and on evaluation was found that it migrated transuterine and required laparoscopy to move.  The issues as to whether this would add risks to an ablation for perforation or transuterine thermal damage also discussed.  Patient will think of all of her options and will follow-up with the biopsy results for final decision as to plan.    Anastasio Auerbach MD, 8:48 AM 07/27/2018

## 2018-08-01 ENCOUNTER — Telehealth: Payer: Self-pay

## 2018-08-01 NOTE — Telephone Encounter (Signed)
Patient informed. Appt scheduled.

## 2018-08-01 NOTE — Telephone Encounter (Signed)
-----   Message from Anastasio Auerbach, MD sent at 08/01/2018 11:27 AM EDT ----- Tell patient the biopsy from the ultrasound did show a piece of polyp.  We talked about a variety of different options.  My recommendation would be to start with hysteroscopy D&C and get rid of the polyp and then see how she does after that.  If she is in agreement then will move forward with scheduling the [facility] and just let me know.

## 2018-08-01 NOTE — Telephone Encounter (Signed)
I spoke with patient and read her Dr. Dorette Grate result note. She said she had given it some thought and she thinks she would prefer to proceed with "partial hysterectomy".

## 2018-08-01 NOTE — Telephone Encounter (Signed)
Recommend office visit to examine to determine best route for hysterectomy

## 2018-08-04 ENCOUNTER — Ambulatory Visit (INDEPENDENT_AMBULATORY_CARE_PROVIDER_SITE_OTHER): Payer: PRIVATE HEALTH INSURANCE | Admitting: Psychology

## 2018-08-04 DIAGNOSIS — F411 Generalized anxiety disorder: Secondary | ICD-10-CM

## 2018-08-08 ENCOUNTER — Other Ambulatory Visit: Payer: Self-pay

## 2018-08-09 ENCOUNTER — Ambulatory Visit: Payer: PRIVATE HEALTH INSURANCE | Admitting: Gynecology

## 2018-08-09 ENCOUNTER — Encounter: Payer: Self-pay | Admitting: Gynecology

## 2018-08-09 VITALS — BP 124/80

## 2018-08-09 DIAGNOSIS — N840 Polyp of corpus uteri: Secondary | ICD-10-CM

## 2018-08-09 DIAGNOSIS — N920 Excessive and frequent menstruation with regular cycle: Secondary | ICD-10-CM

## 2018-08-09 NOTE — Patient Instructions (Signed)
Office will call to arrange surgery

## 2018-08-09 NOTE — Progress Notes (Signed)
° ° °  Deja Pisarski November 02, 1980 [id number]        38 y.o.  G2P1002 presents to discuss management plan for her menorrhagia.  Patient notes over the last several years her periods have worsened lasting 7 days requiring double protection with bleedthrough episodes.  Most recently had a sonohysterogram which showed the uterus to be normal size and echotexture.  Echogenic focus noted 16 x 4 mm within the cavity.  Right and left ovaries normal.  Endometrial biopsy showed a portion of endometrial polyp.  Past medical history,surgical history, problem list, medications, allergies, family history and social history were all reviewed and documented in the EPIC chart.  Directed ROS with pertinent positives and negatives documented in the history of present illness/assessment and plan.  Exam: Caryn Bee assistant Vitals:   08/09/18 1423  BP: 124/80   General appearance:  Normal HEENT normal Lungs clear Cardiac regular rate no rubs murmurs or gallops Abdomen soft nontender without masses guarding rebound Pelvic external BUS vagina normal.  Cervix normal.  Uterus retroverted midline mobile nontender.  Adnexa without masses or tenderness.  Rectal exam is normal. Tenaculum test with little descent.  Assessment/Plan:  38 y.o. G2P1002 with years of worsening menorrhagia.  Sonohysterogram shows endometrial polyp.  Options reviewed to include hysteroscopy with resection of polyp with or without endometrial ablation, resection of polyp with placement of Mirena IUD, hormonal manipulation, hysterectomy.  Options for hysterectomy discussed to include TAH, LAVH and robotic assisted.  She has a history of 2 prior cesarean sections and laparoscopy for removal of migrated IUD into the abdominal cavity.  The pros and cons of each choice were reviewed to include the risks particularly with her history of IUD migration as well as 2 cesarean sections.  If hysterectomy elected given poor descent with tenaculum test and the  prior surgeries we feel robotic approach most appropriate to assure minimal surgery saving TAH.  At this point though the patient wants to go ahead and try hysteroscopy D&C with resection of the polyp and then see how her bleeding does afterwards.  If it does not become acceptable then she is going to proceed with hysterectomy.  I reviewed the proposed surgery with the patient to include the expected intraoperative and postoperative courses as well as the recovery period. The use of the hysteroscope, resectoscope and the D&C portion were all discussed. The risks of surgery to include infection, prolonged antibiotics, hemorrhage necessitating transfusion and the risks of transfusion were all discussed understood and accepted. The risk of damage to internal organs during the procedure, either immediately recognized or delay recognized, including vagina, cervix, uterus, possible perforation causing damage to bowel, bladder, ureters, vessels and nerves necessitating major exploratory reparative surgery and future reparative surgeries including bladder repair, ureteral damage repair, bowel resection, ostomy formation was also discussed understood and accepted. The patient's questions were answered to her satisfaction and she is ready to proceed with surgery.   Anastasio Auerbach MD, 2:54 PM 08/09/2018

## 2018-08-10 ENCOUNTER — Telehealth: Payer: Self-pay

## 2018-08-10 NOTE — Telephone Encounter (Signed)
I called patient to discuss scheduling surgery.  I reviewed her insurance benefits and her estimated surgery prepymt due. Patient feels she is unable to schedule at this time due to finances.  I told her I will hold onto her surgery chart and if anything changes to call me.

## 2018-08-18 ENCOUNTER — Ambulatory Visit (INDEPENDENT_AMBULATORY_CARE_PROVIDER_SITE_OTHER): Payer: PRIVATE HEALTH INSURANCE | Admitting: Psychology

## 2018-08-18 ENCOUNTER — Ambulatory Visit: Payer: PRIVATE HEALTH INSURANCE | Admitting: Psychology

## 2018-08-18 DIAGNOSIS — F411 Generalized anxiety disorder: Secondary | ICD-10-CM | POA: Diagnosis not present

## 2018-10-11 ENCOUNTER — Encounter: Payer: Self-pay | Admitting: Gynecology

## 2018-12-22 ENCOUNTER — Other Ambulatory Visit: Payer: Self-pay | Admitting: Women's Health

## 2018-12-22 ENCOUNTER — Encounter: Payer: Self-pay | Admitting: Women's Health

## 2018-12-22 DIAGNOSIS — N920 Excessive and frequent menstruation with regular cycle: Secondary | ICD-10-CM

## 2018-12-22 DIAGNOSIS — R5383 Other fatigue: Secondary | ICD-10-CM

## 2019-01-31 ENCOUNTER — Ambulatory Visit
Admission: EM | Admit: 2019-01-31 | Discharge: 2019-01-31 | Disposition: A | Discharge status: Home / Self Care | Payer: Medicaid Other | Attending: Family Medicine | Admitting: Family Medicine

## 2019-01-31 ENCOUNTER — Other Ambulatory Visit: Payer: Self-pay

## 2019-01-31 ENCOUNTER — Encounter: Payer: Self-pay | Admitting: Emergency Medicine

## 2019-01-31 DIAGNOSIS — R05 Cough: Secondary | ICD-10-CM | POA: Diagnosis not present

## 2019-01-31 DIAGNOSIS — B349 Viral infection, unspecified: Secondary | ICD-10-CM

## 2019-01-31 DIAGNOSIS — R519 Headache, unspecified: Secondary | ICD-10-CM

## 2019-01-31 DIAGNOSIS — M791 Myalgia, unspecified site: Secondary | ICD-10-CM | POA: Diagnosis not present

## 2019-01-31 DIAGNOSIS — R5383 Other fatigue: Secondary | ICD-10-CM

## 2019-01-31 MED ORDER — HYDROCOD POLST-CPM POLST ER 10-8 MG/5ML PO SUER: 5.0000 mL | Freq: Two times a day (BID) | ORAL | 0 refills | Status: DC | PRN

## 2019-01-31 NOTE — ED Triage Notes (Signed)
Pt c/o fatigue, headache, body aches, cough, started about week ago but started getting worse Sunday. She was tested for covid last Saturday and was negative. She was tested at [facility] center in [place].

## 2019-01-31 NOTE — ED Provider Notes (Signed)
[facility]    CSN: [id number] Arrival date & time: 01/31/19  1020      History   Chief Complaint Chief Complaint  Patient presents with  . Cough  . Fatigue    HPI Gabriela Evans is a 39 y.o. female.   39 yo female with a c/o fatigue, headaches, body aches, cough for the past week. States she was tested for covid 4 days ago and result was negative. Denies any chest pains, shortness of breath.      History reviewed. No pertinent past medical history.  Patient Active Problem List   Diagnosis Date Noted  . Menorrhagia 03/21/2018    Past Surgical History:  Procedure Laterality Date  . CESAREAN SECTION     X2  . PELVIC LAPAROSCOPY  2009   remove perforated IUD  . WISDOM TOOTH EXTRACTION      OB History    Gravida  2   Para  2   Term  1   Preterm      AB  0   Living  2     SAB      TAB      Ectopic  0   Multiple      Live Births               Home Medications    Prior to Admission medications   Medication Sig Start Date End Date Taking? Authorizing Provider  BLACK ELDERBERRY,BERRY-FLOWER, PO Take by mouth.   Yes [provider]  CALCIUM ASCORBATE PO Take by mouth.   Yes [provider]  cholecalciferol (VITAMIN D) 1000 units tablet Take 1,000 Units by mouth daily.   Yes [provider]  ferrous sulfate 325 (65 FE) MG tablet Take 325 mg by mouth daily with breakfast.   Yes [provider]  Multiple Vitamins-Minerals (MULTIVITAMIN WITH MINERALS) tablet Take 1 tablet by mouth daily.   Yes [provider]  Probiotic Product (PROBIOTIC PO) Take 1 tablet by mouth daily.   Yes [provider]  chlorpheniramine-HYDROcodone (TUSSIONEX PENNKINETIC ER) 10-8 MG/5ML SUER Take 5 mLs by mouth every 12 (twelve) hours as needed. 01/31/19   Norval Gable, MD    Family History Family History  Problem Relation Age of Onset  . Diabetes Mother   . Thyroid disease Father   . Diabetes Father     . Thyroid disease Sister   . Thyroid disease Maternal Aunt   . Cancer Paternal Aunt     Social History Social History   Tobacco Use  . Smoking status: Never Smoker  . Smokeless tobacco: Never Used  Substance Use Topics  . Alcohol use: Yes    Comment: occas  . Drug use: No     Allergies   Nickel and Oxycodone-acetaminophen   Review of Systems Review of Systems   Physical Exam Triage Vital Signs ED Triage Vitals  Enc Vitals Group     BP 01/31/19 1045 121/88     Pulse Rate 01/31/19 1045 78     Resp 01/31/19 1045 18     Temp 01/31/19 1045 98.4 F (36.9 C)     Temp Source 01/31/19 1045 Oral     SpO2 01/31/19 1045 98 %     Weight 01/31/19 1040 215 lb (97.5 kg)     Height 01/31/19 1040 5\' 10"  (1.778 m)     Head Circumference --      Peak Flow --      Pain Score 01/31/19 1039  2     Pain Loc --      Pain Edu? --      Excl. in [place]? --    No data found.  Updated Vital Signs BP 121/88 (BP Location: Left Arm)   Pulse 78   Temp 98.4 F (36.9 C) (Oral)   Resp 18   Ht 5\' 10"  (1.778 m)   Wt 97.5 kg   LMP 01/30/2019   SpO2 98%   BMI 30.85 kg/m   Visual Acuity Right Eye Distance:   Left Eye Distance:   Bilateral Distance:    Right Eye Near:   Left Eye Near:    Bilateral Near:     Physical Exam Vitals and nursing note reviewed.  Constitutional:      General: She is not in acute distress.    Appearance: She is not toxic-appearing or diaphoretic.  Cardiovascular:     Rate and Rhythm: Normal rate.  Pulmonary:     Effort: Pulmonary effort is normal. No respiratory distress.  Neurological:     Mental Status: She is alert.      UC Treatments / Results  Labs (all labs ordered are listed, but only abnormal results are displayed) Labs Reviewed - No data to display  EKG   Radiology No results found.  Procedures Procedures (including critical care time)  Medications Ordered in UC Medications - No data to display  Initial Impression / Assessment  and Plan / UC Course  I have reviewed the triage vital signs and the nursing notes.  Pertinent labs & imaging results that were available during my care of the patient were reviewed by me and considered in my medical decision making (see chart for details).      Final Clinical Impressions(s) / UC Diagnoses   Final diagnoses:  Acute viral syndrome     Discharge Instructions     Rest, fluids, over the counter medications as needed    ED Prescriptions    Medication Sig Dispense Auth. Provider   chlorpheniramine-HYDROcodone (TUSSIONEX PENNKINETIC ER) 10-8 MG/5ML SUER Take 5 mLs by mouth every 12 (twelve) hours as needed. 60 mL Norval Gable, MD      1. diagnosis reviewed with patient/parent 2. rx as per orders above; reviewed possible side effects, interactions, risks and benefits  3. Recommend repeat covid testing, however patient declined 4. Recommend  supportive treatment as above 5. Follow-up prn if symptoms worsen or don't improve   I have reviewed the PDMP during this encounter.   Norval Gable, MD 01/31/19 1807

## 2019-01-31 NOTE — Discharge Instructions (Addendum)
Rest, fluids, over the counter medications as needed  

## 2019-02-03 ENCOUNTER — Other Ambulatory Visit: Payer: Self-pay

## 2019-02-03 ENCOUNTER — Ambulatory Visit
Admission: EM | Admit: 2019-02-03 | Discharge: 2019-02-03 | Disposition: A | Discharge status: Home / Self Care | Payer: Medicaid Other | Attending: Urgent Care | Admitting: Urgent Care

## 2019-02-03 ENCOUNTER — Ambulatory Visit (INDEPENDENT_AMBULATORY_CARE_PROVIDER_SITE_OTHER): Payer: Medicaid Other

## 2019-02-03 ENCOUNTER — Telehealth: Payer: PRIVATE HEALTH INSURANCE

## 2019-02-03 DIAGNOSIS — R0789 Other chest pain: Secondary | ICD-10-CM | POA: Diagnosis not present

## 2019-02-03 DIAGNOSIS — J189 Pneumonia, unspecified organism: Secondary | ICD-10-CM | POA: Diagnosis not present

## 2019-02-03 IMAGING — CR DG CHEST 2V
2 series · 2 of 2 positions shown · non-contrast
Comparison: None.

CLINICAL DATA: Cough and fatigue.

EXAM:
CHEST - 2 VIEW

[chest pa]
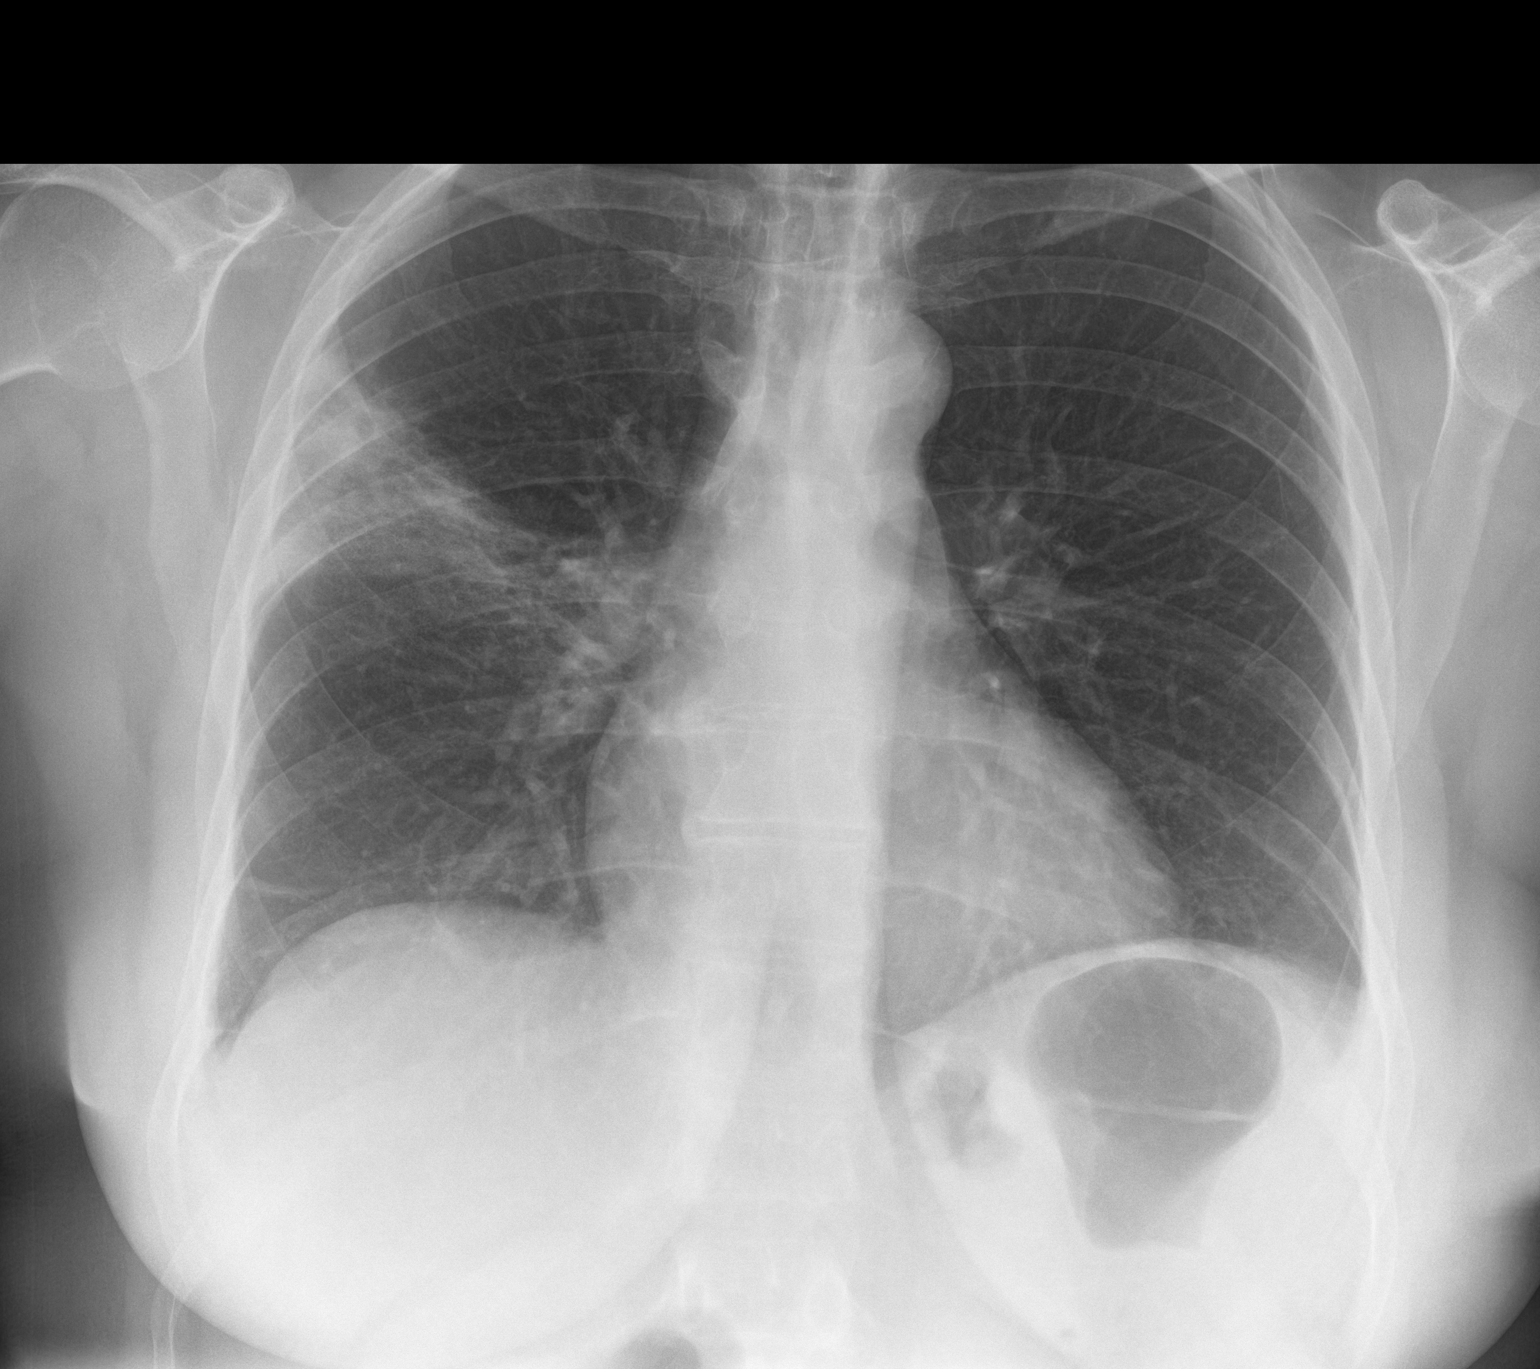

[chest lat]
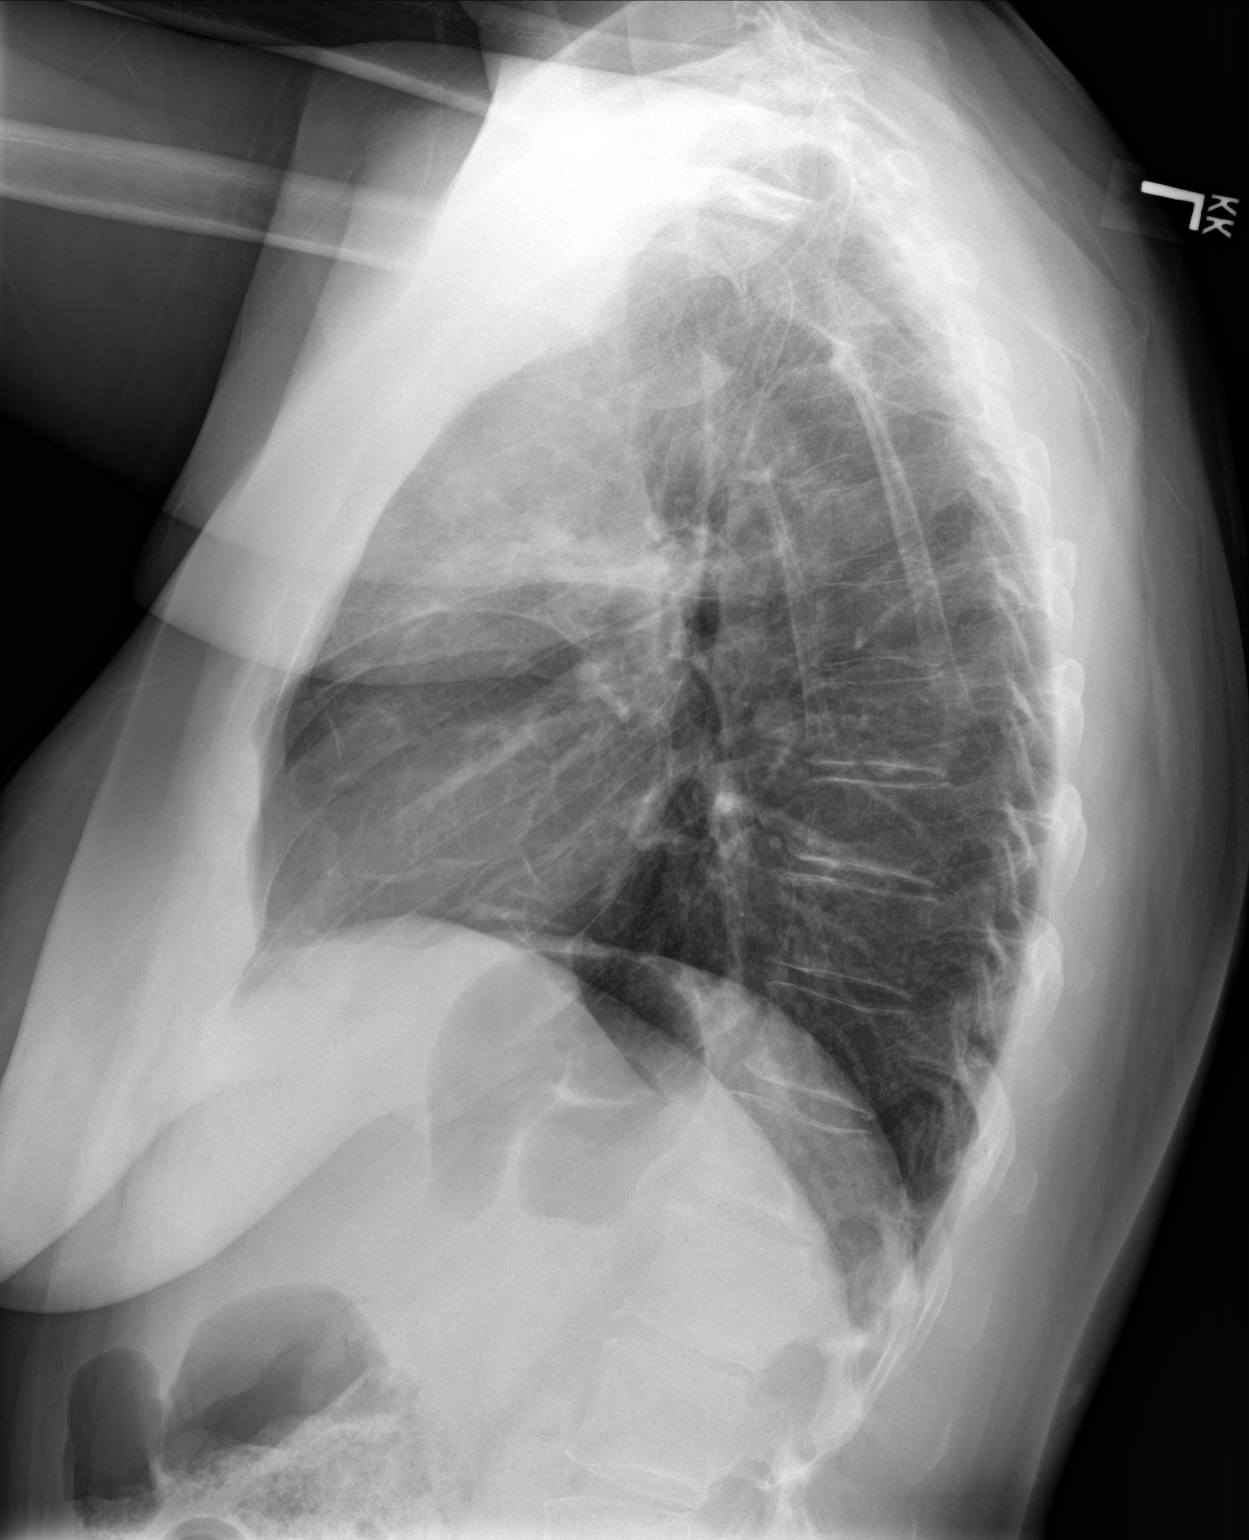

[2 of 2 positions shown; findings below may reference images not displayed]

FINDINGS: Heart size is normal. Mediastinal shadows are normal. The left lung
is clear. There is pulmonary infiltrate in the anterior inferior
right upper lobe consistent with bronchopneumonia. This pattern is
more typical of bacterial disease. No effusions. No acute bone
finding.
IMPRESSION: Acute pneumonia in the anterior inferior right upper lobe. The
remainder the chest is clear. This pattern is more typical of
bacterial pneumonia rather than coronavirus pneumonia.

## 2019-02-03 MED ORDER — FLUCONAZOLE 150 MG PO TABS: ORAL_TABLET | ORAL | 0 refills | Status: DC

## 2019-02-03 MED ORDER — AMOXICILLIN-POT CLAVULANATE 875-125 MG PO TABS: 1.0000 | ORAL_TABLET | Freq: Two times a day (BID) | ORAL | 0 refills | Status: AC

## 2019-02-03 MED ORDER — AZITHROMYCIN 250 MG PO TABS: 250.0000 mg | ORAL_TABLET | Freq: Every day | ORAL | 0 refills | Status: DC

## 2019-02-03 NOTE — ED Triage Notes (Signed)
Patient states that she was here on Tuesday for cough and fatigue. Patient states that she has been persistant but worsening symptoms since Friday the 5th. Was told they could not do a virtual visit today because they thought she could benefit from a chest x-ray. Patient states that she is still running fevers and has body aches.

## 2019-02-03 NOTE — Discharge Instructions (Signed)
It was very nice seeing you today in clinic. Thank you for entrusting me with your care.   Rest and Stay HYDRATED. Water and electrolyte containing beverages (Gatorade, Pedialyte) are best to prevent dehydration and electrolyte abnormalities. May use Tylenol and/or Ibuprofen as needed for pain/fever. Take antibiotics as prescribed.   Make arrangements to follow up with your regular doctor in 1 week for re-evaluation if not improving. If your symptoms/condition worsens, please seek follow up care either here or in the ER. Please remember, our [facility] providers are "right here with you" when you need [place].   Again, it was my pleasure to take care of you today. Thank you for choosing our clinic. I hope that you start to feel better quickly.   Honor Loh, MSN, APRN, FNP-C, CEN Advanced Practice Provider [facility]

## 2019-02-04 NOTE — ED Provider Notes (Signed)
[place], [place]   Name: Gabriela Evans DOB: [birth year] MRN: [id number] CSN: [id number] PCP: Patient, No Pcp Per  Arrival date and time:  02/03/19 1527  Chief Complaint:  Fever   NOTE: Prior to seeing the patient today, I have reviewed the triage nursing documentation and vital signs. Clinical staff has updated patient's PMH/PSHx, current medication list, and drug allergies/intolerances to ensure comprehensive history available to assist in medical decision making.   History:   HPI: Gabriela Evans is a 39 y.o. female who presents today with complaints of worsening cough, myalgias, headaches, and RIGHT ear pressure that started approximately on 01/24/2019. Patient reports subjective fevers; Tmax unknown. Patient was seen here earlier in the week and diagnosed with viral URI. At that time, patient was about 3 or 4 days s/p being tested for SARS-CoV-2 (novel coronavirus). She was tested at a community event at [facility] in [place], [place].  She refused repeat testing. Patient was discharged home with antitussive and instructions on supportive care. Over the course of the past 3 days, cough has worsened and become productive of "brown/orange" sputum. She denies shortness of breath and wheezing. She denies that she has experienced any nausea, vomiting, diarrhea, or abdominal pain. She is eating and drinking well. Patient denies any perceived alterations to her sense of taste or smell. Patient presents out of concerns for her personal health due to worsening of her symptoms. She was scheduled for a tele-health visit today, however was advised to come in for a clinic visit as she was felt to need imaging of her chest. Patient denies being in close contact with anyone known to be ill; no one else is her home has experienced a similar symptom constellation. In efforts to conservatively manage her symptoms at home, the patient notes that she has continue to use the prescribed antitussive medication, which has  helped to improve her symptoms to some degree.  History reviewed. No pertinent past medical history.  Past Surgical History:  Procedure Laterality Date  . CESAREAN SECTION     X2  . PELVIC LAPAROSCOPY  2009   remove perforated IUD  . WISDOM TOOTH EXTRACTION      Family History  Problem Relation Age of Onset  . Diabetes Mother   . Thyroid disease Father   . Diabetes Father   . Thyroid disease Sister   . Thyroid disease Maternal Aunt   . Cancer Paternal Aunt     Social History   Tobacco Use  . Smoking status: Never Smoker  . Smokeless tobacco: Never Used  Substance Use Topics  . Alcohol use: Yes    Comment: occas  . Drug use: No    Patient Active Problem List   Diagnosis Date Noted  . Menorrhagia 03/21/2018    Home Medications:    Current Meds  Medication Sig  . BLACK ELDERBERRY,BERRY-FLOWER, PO Take by mouth.  Marland Kitchen CALCIUM ASCORBATE PO Take by mouth.  . chlorpheniramine-HYDROcodone (TUSSIONEX PENNKINETIC ER) 10-8 MG/5ML SUER Take 5 mLs by mouth every 12 (twelve) hours as needed.  . cholecalciferol (VITAMIN D) 1000 units tablet Take 1,000 Units by mouth daily.  . ferrous sulfate 325 (65 FE) MG tablet Take 325 mg by mouth daily with breakfast.  . Multiple Vitamins-Minerals (MULTIVITAMIN WITH MINERALS) tablet Take 1 tablet by mouth daily.  . Probiotic Product (PROBIOTIC PO) Take 1 tablet by mouth daily.    Allergies:   Nickel and Oxycodone-acetaminophen  Review of Systems (ROS): Review of Systems  Constitutional: Positive for fatigue and  fever.  HENT: Positive for ear pain. Negative for congestion, postnasal drip, rhinorrhea, sinus pressure, sinus pain, sneezing and sore throat.   Eyes: Negative for pain, discharge and redness.  Respiratory: Positive for cough. Negative for chest tightness and shortness of breath.   Cardiovascular: Negative for chest pain and palpitations.  Gastrointestinal: Negative for abdominal pain, diarrhea, nausea and vomiting.    Musculoskeletal: Positive for myalgias. Negative for arthralgias, back pain and neck pain.  Skin: Negative for color change, pallor and rash.  Neurological: Negative for dizziness, syncope, weakness and headaches.  Hematological: Negative for adenopathy.     Vital Signs: Today's Vitals   02/03/19 1544 02/03/19 1545 02/03/19 1547 02/03/19 1715  BP:   121/84   Pulse:   77   Resp:   16   Temp:   99.3 F (37.4 C)   TempSrc:   Oral   SpO2:   99%   Weight:  213 lb 13.5 oz (97 kg)    Height:  5\' 10"  (1.778 m)    PainSc: 0-No pain   0-No pain    Physical Exam: Physical Exam  Constitutional: She is oriented to person, place, and time and well-developed, well-nourished, and in no distress.  HENT:  Head: Normocephalic and atraumatic.  Right Ear: A middle ear effusion (mild serous) is present.  Left Ear: Tympanic membrane normal.  Nose: No mucosal edema, rhinorrhea or sinus tenderness.  Mouth/Throat: Uvula is midline, oropharynx is clear and moist and mucous membranes are normal.  Eyes: Pupils are equal, round, and reactive to light.  Neck: No tracheal deviation present.  Cardiovascular: Normal rate, regular rhythm, normal heart sounds and intact distal pulses.  Pulmonary/Chest: Effort normal. She has decreased breath sounds in the right upper field. She has rhonchi in the right upper field and the right middle field.  Musculoskeletal:     Cervical back: Normal range of motion and neck supple.  Lymphadenopathy:       Head (right side): Submandibular adenopathy present.  Neurological: She is alert and oriented to person, place, and time. Gait normal.  Skin: Skin is warm and dry. No rash noted. She is not diaphoretic.  Psychiatric: Mood, memory, affect and judgment normal.  Nursing note and vitals reviewed.   Urgent Care Treatments / Results:   Orders Placed This Encounter  Procedures  . DG Chest 2 View    LABS: PLEASE NOTE: all labs that were ordered this encounter are  listed, however only abnormal results are displayed. Labs Reviewed - No data to display  EKG: -None  RADIOLOGY: DG Chest 2 View  Result Date: 02/03/2019 CLINICAL DATA:  Cough and fatigue. EXAM: CHEST - 2 VIEW COMPARISON:  None. FINDINGS: Heart size is normal. Mediastinal shadows are normal. The left lung is clear. There is pulmonary infiltrate in the anterior inferior right upper lobe consistent with bronchopneumonia. This pattern is more typical of bacterial disease. No effusions. No acute bone finding. IMPRESSION: Acute pneumonia in the anterior inferior right upper lobe. The remainder the chest is clear. This pattern is more typical of bacterial pneumonia rather than coronavirus pneumonia. Electronically Signed   By: Nelson Chimes M.D.   On: 02/03/2019 16:27    PROCEDURES: Procedures  MEDICATIONS RECEIVED THIS VISIT: Medications - No data to display  PERTINENT CLINICAL COURSE NOTES/UPDATES:   Initial Impression / Assessment and Plan / Urgent Care Course:  Pertinent labs & imaging results that were available during my care of the patient were personally reviewed by me and considered  in my medical decision making (see lab/imaging section of note for values and interpretations).  Gabriela Evans is a 39 y.o. female who presents to [facility] today with complaints of Fever   Patient overall well appearing and in no acute distress today in clinic. Presenting symptoms (see HPI) and exam as documented above. She presents with symptoms associated with SARS-CoV-2 (novel coronavirus). Discussed typical symptom constellation. Reviewed potential for infection and need for repeat testing, however patient declined. Given continued symptoms, will pursue further evaluation of her symptoms. Radiographs of the chest performed today revealed an infiltrate in the RIGHT upper lobe of the patient's lung consistent with CAP. Radiologist specifically mentioned that appearance consistent with a bacterial  etiology versus pneumonia related to SARS-CoV-2. Will proceed with treatment using a 7 day course of amoxicillin-clavulanate + a 5 day course of azithromycin to cover for atypicals. Discussed supportive care measures at home during acute phase of illness. Patient to rest as much as possible. She was encouraged to ensure adequate hydration (water and ORS) to prevent dehydration and electrolyte derangements. Patient may use APAP and/or IBU on an as needed basis for pain/fever. Patient still has cough medication at home from previous visit. Encouraged to continue medication as previously prescribed to help with her cough. Patient has has a history of vulvovaginal candidiasis in the past while on oral antimicrobial therapy. Will send in prophylactic fluconazole dose (150 mg x 1 - may repeat in 72 hours if still symptomatic) for patient to use should she develop symptoms.  Discussed follow up with primary care physician in 1 week for re-evaluation. I have reviewed the follow up and strict return precautions for any new or worsening symptoms. Patient is aware of symptoms that would be deemed urgent/emergent, and would thus require further evaluation either here or in the emergency department. At the time of discharge, she verbalized understanding and consent with the discharge plan as it was reviewed with her. All questions were fielded by provider and/or clinic staff prior to patient discharge.    Final Clinical Impressions / Urgent Care Diagnoses:   Final diagnoses:  Community acquired pneumonia of right upper lobe of lung    New Prescriptions:  [place] Controlled Substance Registry consulted? Not Applicable  Meds ordered this encounter  Medications  . amoxicillin-clavulanate (AUGMENTIN) 875-125 MG tablet    Sig: Take 1 tablet by mouth 2 (two) times daily for 7 days.    Dispense:  14 tablet    Refill:  0  . azithromycin (ZITHROMAX) 250 MG tablet    Sig: Take 1 tablet (250 mg total) by mouth daily. Take  first 2 tablets together, then 1 every day until finished.    Dispense:  6 tablet    Refill:  0  . fluconazole (DIFLUCAN) 150 MG tablet    Sig: Take 1 tablet (150 mg) PO x 1 dose. May repeat 150 mg dose in 3 days if still symptomatic.    Dispense:  2 tablet    Refill:  0    Recommended Follow up Care:  Patient encouraged to follow up with the following provider within the specified time frame, or sooner as dictated by the severity of her symptoms. As always, she was instructed that for any urgent/emergent care needs, she should seek care either here or in the emergency department for more immediate evaluation.  Follow-up Information    PCP In 1 week.   Why: General reassessment of symptoms if not improving  NOTE: This note was prepared using Lobbyist along with smaller Company secretary. Despite my best ability to proofread, there is the potential that transcriptional errors may still occur from this process, and are completely unintentional.    Karen Kitchens, NP 02/04/19 2138

## 2019-02-07 ENCOUNTER — Other Ambulatory Visit: Payer: Self-pay

## 2019-02-07 ENCOUNTER — Other Ambulatory Visit: Payer: Self-pay | Admitting: Women's Health

## 2019-02-07 DIAGNOSIS — R05 Cough: Secondary | ICD-10-CM

## 2019-02-07 DIAGNOSIS — R059 Cough, unspecified: Secondary | ICD-10-CM

## 2019-02-08 ENCOUNTER — Encounter: Payer: Self-pay | Admitting: Obstetrics & Gynecology

## 2019-02-08 ENCOUNTER — Ambulatory Visit (INDEPENDENT_AMBULATORY_CARE_PROVIDER_SITE_OTHER): Payer: Medicaid Other | Admitting: Obstetrics & Gynecology

## 2019-02-08 VITALS — BP 132/90

## 2019-02-08 DIAGNOSIS — N840 Polyp of corpus uteri: Secondary | ICD-10-CM

## 2019-02-08 DIAGNOSIS — R05 Cough: Secondary | ICD-10-CM | POA: Diagnosis not present

## 2019-02-08 DIAGNOSIS — N920 Excessive and frequent menstruation with regular cycle: Secondary | ICD-10-CM

## 2019-02-08 DIAGNOSIS — R059 Cough, unspecified: Secondary | ICD-10-CM

## 2019-02-08 DIAGNOSIS — R5383 Other fatigue: Secondary | ICD-10-CM | POA: Diagnosis not present

## 2019-02-08 NOTE — Progress Notes (Signed)
    Gabriela Evans Sep 01, 1980 [id number]        38 y.o.  G2P1002   RP: Longstanding, worsening Menorrhagia with Endometrial Polyp for management counseling  HPI: Longstanding Menorrhagia, worsening x >6 months.   Ultrasound with Dr Phineas Real suggested an endometrial polyp.  Endometrial biopsy taken was benign on 07/27/2018. Currently not sexually active. History of 2 cesarean sections which historically sound like low transverse as she was planning trial of labor with second pregnancy but infant was macrosomic and they elected for repeat C-section.  Also has a history of perforation with IUD.  It was placed and apparently did well initially but then started bleeding heavily and on evaluation was found that it migrated transuterine and required laparoscopy to move.  No desire to preserve fertility.  Patient is on antibiotic to treat a probable pneumonia.  OB History  Gravida Para Term Preterm AB Living  2 2 1    0 2  SAB TAB Ectopic Multiple Live Births      0        # Outcome Date GA Lbr Len/2nd Weight Sex Delivery Anes PTL Lv  2 Term 2011    F CS-Unspec     1 Para 2009    M CS-Unspec       Past medical history,surgical history, problem list, medications, allergies, family history and social history were all reviewed and documented in the EPIC chart.   Directed ROS with pertinent positives and negatives documented in the history of present illness/assessment and plan.  Exam:  Vitals:   02/08/19 1609  BP: 132/90   General appearance:  Normal  Pelvic US/Sonohysto on 07/27/2018: Ultrasoundtransvaginal shows uterus normal size and echotexture. Endometrial echo 6.7 mm with echogenic focus measuring 16 x 4 mm. Right and left ovaries normal. Cul-de-sac negative. Sonohysterogram performed, sterile technique, easy catheter introduction, good distention withposterior filling defect 16 x 10 x 9 mm. Endometrial sample taken. Patient tolerated well.  [id number] Endometrium, biopsy, uterus -  ENDOMETRIAL POLYP. ENDOMETRIUM WITH PROGESTIN EFFECT. NO MALIGNANCY IDENTIFIED.   Assessment/Plan:  39 y.o. G2P1002   1. Menorrhagia with regular cycle Longstanding menorrhagia worsening x >6 months.  Ultrasound suggests endometrial polyp.  Endometrial biopsy benign 07/2018.  Currently not sexually active.  No desire to preserve fertility.  Options for hysteroscopy with resection of the polyp and monitoring cycles afterwards.  Endometrial ablation not recommended given 2 previous C/Ss and a migrated IUD requiring LPS for removal.  After thinking of all the options including medical and surgical, patient opts for hysterectomy.  Different approaches reviewed..Decision to proceed with XI Robotic TLH/Bilateral Salpingectomy.  Surgery, risks and benefits reviewed with patient.  Robotic hysterectomy pamphlet given.  We will follow-up for a preop visit, may opt for a televisit. - TSH - Thyroid antibodies - T4 - T3 uptake - CBC  2. Endometrial polyp As above.  3. Cough On antibiotic to treat a probable pneumonia.  Covid testing not done so far.  We will do today. - SAR CoV2 Serology (COVID 19)AB(IGG)IA  4. Other fatigue Rule out thyroid dysfunction.  Rule out anemia. - TSH - Thyroid antibodies - T4 - T3 uptake - CBC   [name] MD, 4:33 PM 02/08/2019

## 2019-02-09 LAB — T4
Free Thyroxine Index: 2.2 (ref 1.4–3.8)
T4, Total: 8.6 ug/dL (ref 5.1–11.9)

## 2019-02-09 LAB — TSH: TSH: 2.47 mIU/L

## 2019-02-09 LAB — SAR COV2 SEROLOGY (COVID19)AB(IGG),IA: SARS CoV2 AB IGG: POSITIVE — AB

## 2019-02-09 LAB — THYROID ANTIBODIES
Thyroglobulin Ab: <1 IU/mL (ref <=1)
Thyroperoxidase Ab SerPl-aCnc: <1 IU/mL (ref <9)

## 2019-02-09 LAB — T3 UPTAKE: T3 Uptake: 26 % (ref 22–35)

## 2019-02-10 ENCOUNTER — Encounter: Payer: Self-pay | Admitting: Obstetrics & Gynecology

## 2019-02-10 NOTE — Patient Instructions (Signed)
1. Menorrhagia with regular cycle Longstanding menorrhagia worsening x >6 months.  Ultrasound suggests endometrial polyp.  Endometrial biopsy benign 07/2018.  Currently not sexually active.  No desire to preserve fertility.  Options for hysteroscopy with resection of the polyp and monitoring cycles afterwards.  Endometrial ablation not recommended given 2 previous C/Ss and a migrated IUD requiring LPS for removal.  After thinking of all the options including medical and surgical, patient opts for hysterectomy.  Different approaches reviewed..Decision to proceed with XI Robotic TLH/Bilateral Salpingectomy.  Surgery, risks and benefits reviewed with patient.  Robotic hysterectomy pamphlet given.  We will follow-up for a preop visit, may opt for a televisit. - TSH - Thyroid antibodies - T4 - T3 uptake - CBC  2. Endometrial polyp As above.  3. Cough On antibiotic to treat a probable pneumonia.  Covid testing not done so far.  We will do today. - SAR CoV2 Serology (COVID 19)AB(IGG)IA  4. Other fatigue Rule out thyroid dysfunction.  Rule out anemia. - TSH - Thyroid antibodies - T4 - T3 uptake - CBC  Gabriela Evans, it was a pleasure seeing you today!  I will inform you of your results as soon as they are available.

## 2019-02-13 ENCOUNTER — Telehealth: Payer: Self-pay

## 2019-02-13 NOTE — Telephone Encounter (Signed)
Note from ML "Please make sure that patient has done the Labs ordered: TSH panel, CBC, SARS-Covid."  All tests were performed except CBC. I spoke with Elmyra Ricks in lab and she explained that labs were drawn before visit and after visit she was asked about adding CBC  But she did not draw a purple tube and that is needed for CBC so patient will need to return to have that CBC drawn.  Left message for patient to call me.

## 2019-02-15 ENCOUNTER — Telehealth: Payer: Self-pay

## 2019-02-15 NOTE — Telephone Encounter (Signed)
Left message for patient to call me. Let her know I am waiting on April schedule but there are things we need to arrange prior to that. (Signing Medicaid Hyst form at least 30 days prior to surgery, Needs PCP referral [facility]).

## 2019-02-23 ENCOUNTER — Other Ambulatory Visit: Payer: Self-pay

## 2019-02-23 NOTE — Telephone Encounter (Signed)
I called patient back and per DPR access note on file I left detailed message in her voice mail providing her the phone number to call to add a PCP or to get a list of PCPs to chose from that she can schedule with. I explained she will need a PCP to provide referral for surgery.  # is [phone].

## 2019-02-23 NOTE — Telephone Encounter (Signed)
Spoke with patient and informed her. Lab appt scheduled.

## 2019-02-23 NOTE — Telephone Encounter (Signed)
Patient and I spoke about need for referral from primary care with [place] [facility]. She is unaware and does not have a PCP. I will check into this and let her know.  I informed her our robot schedule is running April and I am waiting on that schedule and will call her when I get it.  I advised her we need for her to sign Medicaid Hysterectomy statement form before 30 days prior to surgery.  She will be coming to the office on Feb 15 to have lab done and I will see her that day to get it signed.

## 2019-02-27 NOTE — Telephone Encounter (Signed)
Patient called back to let me know she spoke with Medicaid office and her surgery will not require a PCP referral. I called to verify and was told no PCP referral but a prior authorization will be required and will have to be done online at [email or web address]

## 2019-03-03 ENCOUNTER — Other Ambulatory Visit: Payer: Self-pay

## 2019-03-03 ENCOUNTER — Other Ambulatory Visit: Payer: Medicaid Other

## 2019-03-03 LAB — CBC
HCT: 30.9 % — ABNORMAL LOW (ref 35.0–45.0)
Hemoglobin: 10.1 g/dL — ABNORMAL LOW (ref 11.7–15.5)
MCH: 27 pg (ref 27.0–33.0)
MCHC: 32.7 g/dL (ref 32.0–36.0)
MCV: 82.6 fL (ref 80.0–100.0)
MPV: 11.9 fL (ref 7.5–12.5)
Platelets: 284 10*3/uL (ref 140–400)
RBC: 3.74 10*6/uL — ABNORMAL LOW (ref 3.80–5.10)
RDW: 15.3 % — ABNORMAL HIGH (ref 11.0–15.0)
WBC: 6.8 10*3/uL (ref 3.8–10.8)

## 2019-03-06 ENCOUNTER — Other Ambulatory Visit: Payer: Self-pay

## 2019-03-07 ENCOUNTER — Telehealth: Payer: Self-pay

## 2019-03-07 NOTE — Progress Notes (Signed)
Juliann Pulse at dr Dellis Filbert office called patient had negative covid test 02-08-2019 and was very ill and had pneumonia, was tested for antibody for covid and had positive igg antibody on 02-08-2019, called beverly harrelson rn and made aware, beverly harrelson to send message to infection control, dose patietn need repeat covid test for 04-26-2019 surgery and let this Probation officer of note  know.

## 2019-03-07 NOTE — Telephone Encounter (Signed)
I called patient to let her know April schedule ready. We scheduled her for Apr 7 at 7:30am at [facility].    No Covid test scheduled pending speaking with pre-admission nurse about her Covid history.  Pre op visit scheduled 3/31 at 3:00pm with Dr. Marguerita Merles.  Pt has Medicaid and hysterectomy statement form has been signed and is on file in her surgery chart.  I will mail her a packet.

## 2019-03-22 NOTE — Progress Notes (Signed)
Spoke with beverly harrelson rn, she will reach out to if group and see what recommendation is made for if covid test needed prior to 04-26-2019 surgery

## 2019-03-24 ENCOUNTER — Telehealth: Payer: Self-pay

## 2019-03-24 NOTE — Telephone Encounter (Signed)
Spoke with patient and advised her she will need a Covid test done pre operative and reviewed quarantine protocol. Appt was scheduled and info sheet mailed to her.

## 2019-03-24 NOTE — Progress Notes (Signed)
Received email from [employer], per infectious disease committee patient should be tested for covid prior to surgery and follow all usual covid testing guidelines.

## 2019-03-24 NOTE — Telephone Encounter (Signed)
I called patient because admitting had been checking with infectious disease to see if patient should have pre op Covid test in light of her history.  Ivin Booty at [facility] called today and said confirmed patient needs to be scheduled for a Covid test and if positive her surgery will be cancelled per protocol.  Left message for patient to call me to schedule time.

## 2019-04-18 NOTE — Progress Notes (Signed)
PCP - no pcp Cardiologist -   Chest x-ray -  EKG -  Stress Test -  ECHO -  Cardiac Cath -   Sleep Study -  CPAP -   Fasting Blood Sugar -  Checks Blood Sugar _____ times a day  Blood Thinner Instructions: Aspirin Instructions: Last Dose:  Anesthesia review:   Patient denies shortness of breath, fever, cough and chest pain at PAT appointment  none   Patient verbalized understanding of instructions that were given to them at the PAT appointment. Patient was also instructed that they will need to review over the PAT instructions again at home before surgery.

## 2019-04-18 NOTE — Patient Instructions (Addendum)
YOU ARE SCHEDULED FOR A COVID TEST __4-3-21_______@___0940  am_________. THIS TEST MUST BE DONE BEFORE SURGERY. GO TO  [address], [place], [address] AND REMAIN IN YOUR CAR, THIS IS A DRIVE UP TEST. ONCE YOUR COVID TEST IS DONE PLEASE FOLLOW ALL THE QUARANTINE  INSTRUCTIONS GIVEN IN YOUR HANDOUT.      Your procedure is scheduled on   04-26-19   Report to [place]. M.   Call this number if you have problems the morning of surgery  :[phone].   OUR ADDRESS IS [place].  WE ARE LOCATED IN [facility].                                     REMEMBER:  DO NOT EAT FOOD OR DRINK LIQUIDS AFTER MIDNIGHT .  YOU MAY  BRUSH YOUR TEETH MORNING OF SURGERY AND RINSE YOUR MOUTH OUT, NO CHEWING GUM CANDY OR MINTS.   TAKE THESE MEDICATIONS MORNING OF SURGERY WITH A SIP OF WATER:  __NONE________________________________  IF YOU ARE SPENDING THE NIGHT AFTER SURGERY PLEASE BRING ALL YOUR PRESCRIPTION MEDICATIONS IN THEIR ORIGINAL BOTTLES.  1 VISITOR IS ALLOWED IN WAITING ROOM ONLY DAY OF SURGERY. NO VISITOR MAY SPEND THE NIGHT. VISITOR  ARE ALLOWED TO STAY UNTIL 800 PM.                                    DO NOT WEAR JEWERLY, MAKE UP, OR NAIL POLISH ON FINGERNAILS. DO NOT WEAR LOTIONS, POWDERS, PERFUMES OR DEODORANT. DO NOT SHAVE FOR 24 HOURS PRIOR TO DAY OF SURGERY.  CONTACTS, GLASSES, OR DENTURES MAY NOT BE WORN TO SURGERY.                                    [facility] IS NOT RESPONSIBLE  FOR ANY BELONGINGS.                                                                    Marland Kitchen           [facility] - Preparing for Surgery Before surgery, you can play an important role.  Because skin is not sterile, your skin needs to be as free of germs as possible.  You can reduce the number of germs on your skin by washing with CHG (chlorahexidine gluconate) soap before surgery.  CHG is an antiseptic cleaner which kills germs and bonds with the skin  to continue killing germs even after washing. Please DO NOT use if you have an allergy to CHG or antibacterial soaps.  If your skin becomes reddened/irritated stop using the CHG and inform your nurse when you arrive at Short Stay. Do not shave (including legs and underarms) for at least 48 hours prior to the first CHG shower.  You may shave your face/neck. Please follow these instructions carefully:  1.  Shower with CHG Soap the night before surgery and the  morning of  Surgery.  2.  If you choose to wash your hair, wash your hair first as usual with your  normal  shampoo.  3.  After you shampoo, rinse your hair and body thoroughly to remove the  shampoo.                           4.  Use CHG as you would any other liquid soap.  You can apply chg directly  to the skin and wash                       Gently with a scrungie or clean washcloth.  5.  Apply the CHG Soap to your body ONLY FROM THE NECK DOWN.   Do not use on face/ open                           Wound or open sores. Avoid contact with eyes, ears mouth and genitals (private parts).                       Wash face,  Genitals (private parts) with your normal soap.             6.  Wash thoroughly, paying special attention to the area where your surgery  will be performed.  7.  Thoroughly rinse your body with warm water from the neck down.  8.  DO NOT shower/wash with your normal soap after using and rinsing off  the CHG Soap.                9.  Pat yourself dry with a clean towel.            10.  Wear clean pajamas.            11.  Place clean sheets on your bed the night of your first shower and do not  sleep with pets. Day of Surgery : Do not apply any lotions/deodorants the morning of surgery.  Please wear clean clothes to the hospital/surgery center.  FAILURE TO FOLLOW THESE INSTRUCTIONS MAY RESULT IN THE CANCELLATION OF YOUR SURGERY PATIENT SIGNATURE_________________________________  NURSE  SIGNATURE__________________________________  ________________________________________________________________________  WHAT IS A BLOOD TRANSFUSION? Blood Transfusion Information  A transfusion is the replacement of blood or some of its parts. Blood is made up of multiple cells which provide different functions.  Red blood cells carry oxygen and are used for blood loss replacement.  White blood cells fight against infection.  Platelets control bleeding.  Plasma helps clot blood.  Other blood products are available for specialized needs, such as hemophilia or other clotting disorders. BEFORE THE TRANSFUSION  Who gives blood for transfusions?   Healthy volunteers who are fully evaluated to make sure their blood is safe. This is blood bank blood. Transfusion therapy is the safest it has ever been in the practice of medicine. Before blood is taken from a donor, a complete history is taken to make sure that person has no history of diseases nor engages in risky social behavior (examples are intravenous drug use or sexual activity with multiple partners). The donor's travel history is screened to minimize risk of transmitting infections, such as malaria. The donated blood is tested for signs of infectious diseases, such as HIV and hepatitis. The blood is then tested to be sure it is compatible with you in order to minimize the chance  of a transfusion reaction. If you or a relative donates blood, this is often done in anticipation of surgery and is not appropriate for emergency situations. It takes many days to process the donated blood. RISKS AND COMPLICATIONS Although transfusion therapy is very safe and saves many lives, the main dangers of transfusion include:   Getting an infectious disease.  Developing a transfusion reaction. This is an allergic reaction to something in the blood you were given. Every precaution is taken to prevent this. The decision to have a blood transfusion has been  considered carefully by your caregiver before blood is given. Blood is not given unless the benefits outweigh the risks. AFTER THE TRANSFUSION  Right after receiving a blood transfusion, you will usually feel much better and more energetic. This is especially true if your red blood cells have gotten low (anemic). The transfusion raises the level of the red blood cells which carry oxygen, and this usually causes an energy increase.  The nurse administering the transfusion will monitor you carefully for complications. HOME CARE INSTRUCTIONS  No special instructions are needed after a transfusion. You may find your energy is better. Speak with your caregiver about any limitations on activity for underlying diseases you may have. SEEK MEDICAL CARE IF:   Your condition is not improving after your transfusion.  You develop redness or irritation at the intravenous (IV) site. SEEK IMMEDIATE MEDICAL CARE IF:  Any of the following symptoms occur over the next 12 hours:  Shaking chills.  You have a temperature by mouth above 102 F (38.9 C), not controlled by medicine.  Chest, back, or muscle pain.  People around you feel you are not acting correctly or are confused.  Shortness of breath or difficulty breathing.  Dizziness and fainting.  You get a rash or develop hives.  You have a decrease in urine output.  Your urine turns a dark color or changes to pink, red, or brown. Any of the following symptoms occur over the next 10 days:  You have a temperature by mouth above 102 F (38.9 C), not controlled by medicine.  Shortness of breath.  Weakness after normal activity.  The white part of the eye turns yellow (jaundice).  You have a decrease in the amount of urine or are urinating less often.  Your urine turns a dark color or changes to pink, red, or brown. Document Released: 01/03/2000 Document Revised: 03/30/2011 Document Reviewed: 08/22/2007 ExitCare Patient Information 2014  [place].  _______________________________________________________________________                                                                   Incentive Spirometer  An incentive spirometer is a tool that can help keep your lungs clear and active. This tool measures how well you are filling your lungs with each breath. Taking long deep breaths may help reverse or decrease the chance of developing breathing (pulmonary) problems (especially infection) following:  A long period of time when you are unable to move or be active. BEFORE THE PROCEDURE   If the spirometer includes an indicator to show your best effort, your nurse or respiratory therapist will set it to a desired goal.  If possible, sit up straight or lean slightly forward. Try not to slouch.  Hold the incentive spirometer in an upright position. INSTRUCTIONS FOR USE  1. Sit on the edge of your bed if possible, or sit up as far as you can in bed or on a chair. 2. Hold the incentive spirometer in an upright position. 3. Breathe out normally. 4. Place the mouthpiece in your mouth and seal your lips tightly around it. 5. Breathe in slowly and as deeply as possible, raising the piston or the ball toward the top of the column. 6. Hold your breath for 3-5 seconds or for as long as possible. Allow the piston or ball to fall to the bottom of the column. 7. Remove the mouthpiece from your mouth and breathe out normally. 8. Rest for a few seconds and repeat Steps 1 through 7 at least 10 times every 1-2 hours when you are awake. Take your time and take a few normal breaths between deep breaths. 9. The spirometer may include an indicator to show your best effort. Use the indicator as a goal to work toward during each repetition. 10. After each set of 10 deep breaths, practice coughing to be sure your lungs are clear. If you have an incision (the cut made at the time of surgery), support your incision when coughing by placing a  pillow or rolled up towels firmly against it. Once you are able to get out of bed, walk around indoors and cough well. You may stop using the incentive spirometer when instructed by your caregiver.  RISKS AND COMPLICATIONS  Take your time so you do not get dizzy or light-headed.  If you are in pain, you may need to take or ask for pain medication before doing incentive spirometry. It is harder to take a deep breath if you are having pain. AFTER USE  Rest and breathe slowly and easily.  It can be helpful to keep track of a log of your progress. Your caregiver can provide you with a simple table to help with this. If you are using the spirometer at home, follow these instructions: [place] IF:   You are having difficultly using the spirometer.  You have trouble using the spirometer as often as instructed.  Your pain medication is not giving enough relief while using the spirometer.  You develop fever of 100.5 F (38.1 C) or higher. SEEK IMMEDIATE MEDICAL CARE IF:   You cough up bloody sputum that had not been present before.  You develop fever of 102 F (38.9 C) or greater.  You develop worsening pain at or near the incision site. MAKE SURE YOU:   Understand these instructions.  Will watch your condition.  Will get help right away if you are not doing well or get worse. Document Released: 05/18/2006 Document Revised: 03/30/2011 Document Reviewed: 07/19/2006 [facility] Patient Information 2014 [place], [place].   ________________________________________________________________________

## 2019-04-19 ENCOUNTER — Encounter (HOSPITAL_COMMUNITY): Payer: Self-pay

## 2019-04-19 ENCOUNTER — Other Ambulatory Visit: Payer: Self-pay

## 2019-04-19 ENCOUNTER — Encounter: Payer: Self-pay | Admitting: Obstetrics & Gynecology

## 2019-04-19 ENCOUNTER — Encounter (HOSPITAL_COMMUNITY)
Admission: RE | Admit: 2019-04-19 | Discharge: 2019-04-19 | Disposition: A | Discharge status: Home / Self Care | Payer: Medicaid Other | Source: Ambulatory Visit | Attending: Obstetrics & Gynecology | Admitting: Obstetrics & Gynecology

## 2019-04-19 ENCOUNTER — Telehealth (INDEPENDENT_AMBULATORY_CARE_PROVIDER_SITE_OTHER): Payer: Medicaid Other | Admitting: Obstetrics & Gynecology

## 2019-04-19 DIAGNOSIS — N920 Excessive and frequent menstruation with regular cycle: Secondary | ICD-10-CM | POA: Diagnosis not present

## 2019-04-19 DIAGNOSIS — N840 Polyp of corpus uteri: Secondary | ICD-10-CM

## 2019-04-19 DIAGNOSIS — R9389 Abnormal findings on diagnostic imaging of other specified body structures: Secondary | ICD-10-CM

## 2019-04-19 DIAGNOSIS — Z01818 Encounter for other preprocedural examination: Secondary | ICD-10-CM | POA: Insufficient documentation

## 2019-04-19 HISTORY — DX: Anemia, unspecified: D64.9

## 2019-04-19 HISTORY — DX: Other complications of anesthesia, initial encounter: T88.59XA

## 2019-04-19 HISTORY — DX: Other abnormal glucose: R73.09

## 2019-04-19 HISTORY — DX: Renal tubulo-interstitial disease, unspecified: N15.9

## 2019-04-19 HISTORY — DX: Pneumonia, unspecified organism: J18.9

## 2019-04-19 HISTORY — DX: Other specified postprocedural states: R11.2

## 2019-04-19 HISTORY — DX: Other specified postprocedural states: Z98.890

## 2019-04-19 NOTE — Progress Notes (Signed)
    Gabriela Evans 24-Jul-1980 [id number]        39 y.o.  G2P2L2 Verbal consent for televisit obtained.  Patient well identified.  Preop counseling forXI robotic TLH with bilateral salpingectomy.  Tele-visit between patient in her home and myself at my [place].  RP: Preop XI Robotic TLH/Bilateral Salpingectomy  HPI: No change x last visit on 02/08/2019:  Longstanding Menorrhagia, worsening x >6 months.  Ultrasound with Dr Phineas Real suggested an endometrial polyp. Endometrial biopsy taken was benign on 07/27/2018. Currently not sexually active. History of 2 cesarean sections which historically sound like low transverse as she was planning trial of labor with second pregnancy but infant was macrosomic and they elected for repeat C-section. Also has a history of perforation with IUD. It was placed and apparently did well initially but then started bleeding heavily and on evaluation was found that it migrated transuterine and required laparoscopy to move. No desire to preserve fertility.  Had Covid 01/2019.  No persistent or residual Sx.   OB History  Gravida Para Term Preterm AB Living  2 2 1    0 2  SAB TAB Ectopic Multiple Live Births      0        # Outcome Date GA Lbr Len/2nd Weight Sex Delivery Anes PTL Lv  2 Term 2011    F CS-Unspec     1 Para 2009    M CS-Unspec       Past medical history,surgical history, problem list, medications, allergies, family history and social history were all reviewed and documented in the EPIC chart.   Directed ROS with pertinent positives and negatives documented in the history of present illness/assessment and plan.  Exam:  There were no vitals filed for this visit. General appearance:  Normal  Tele-Visit   Assessment/Plan:  39 y.o. G2P1002   1. Menorrhagia with regular cycle Longstanding refractory menorrhagia.  History of uterine perforation with IUD displacement, removal by laparoscopy.  2 previous C-section.  No desire to preserve fertility.   Decision to proceed with an XI Robotic total laparoscopic hysterectomy with bilateral salpingectomy.  Preop preparation, surgical procedure with risks and benefits and postop management and precautions thoroughly reviewed with patient.  Patient's questions answered.  Patient understands and agrees with plan.  2. Endometrial polyp EBx benign.                         Patient was counseled as to the risk of surgery to include the following:  1. Infection (prohylactic antibiotics will be administered)  2. DVT/Pulmonary Embolism (prophylactic pneumo compression stockings will be used)  3.Trauma to internal organs requiring additional surgical procedure to repair any injury to internal organs requiring perhaps additional hospitalization days.  4.Hemmorhage requiring transfusion and blood products which carry risks such as anaphylactic reaction, hepatitis and AIDS  Patient had received literature information on the procedure scheduled and all her questions were answered and fully accepts all risk.   [name] MD, 3:01 PM 04/19/2019

## 2019-04-22 ENCOUNTER — Other Ambulatory Visit (HOSPITAL_COMMUNITY)
Admission: RE | Admit: 2019-04-22 | Discharge: 2019-04-22 | Disposition: A | Discharge status: Home / Self Care | Payer: Medicaid Other | Source: Ambulatory Visit | Attending: Obstetrics & Gynecology | Admitting: Obstetrics & Gynecology

## 2019-04-22 DIAGNOSIS — Z20822 Contact with and (suspected) exposure to covid-19: Secondary | ICD-10-CM | POA: Insufficient documentation

## 2019-04-22 DIAGNOSIS — Z01812 Encounter for preprocedural laboratory examination: Secondary | ICD-10-CM | POA: Insufficient documentation

## 2019-04-22 LAB — SARS CORONAVIRUS 2 (TAT 6-24 HRS): SARS Coronavirus 2: NEGATIVE

## 2019-04-24 ENCOUNTER — Encounter (HOSPITAL_COMMUNITY)
Admission: RE | Admit: 2019-04-24 | Discharge: 2019-04-24 | Disposition: A | Discharge status: Home / Self Care | Payer: Medicaid Other | Source: Ambulatory Visit | Attending: Obstetrics & Gynecology | Admitting: Obstetrics & Gynecology

## 2019-04-24 ENCOUNTER — Other Ambulatory Visit: Payer: Self-pay

## 2019-04-24 DIAGNOSIS — Z01812 Encounter for preprocedural laboratory examination: Secondary | ICD-10-CM | POA: Diagnosis present

## 2019-04-24 LAB — ABO/RH: ABO/RH(D): A POS

## 2019-04-24 LAB — CBC
HCT: 36.2 % (ref 36.0–46.0)
Hemoglobin: 11.4 g/dL — ABNORMAL LOW (ref 12.0–15.0)
MCH: 28.1 pg (ref 26.0–34.0)
MCHC: 31.5 g/dL (ref 30.0–36.0)
MCV: 89.2 fL (ref 80.0–100.0)
Platelets: 259 10*3/uL (ref 150–400)
RBC: 4.06 MIL/uL (ref 3.87–5.11)
RDW: 14.2 % (ref 11.5–15.5)
WBC: 6.3 10*3/uL (ref 4.0–10.5)
nRBC: 0 % (ref 0.0–0.2)

## 2019-04-25 NOTE — Anesthesia Preprocedure Evaluation (Addendum)
Anesthesia Evaluation  Patient identified by MRN, date of birth, ID band Patient awake    Reviewed: Allergy & Precautions, NPO status , Patient's Chart, lab work & pertinent test results  History of Anesthesia Complications (+) PONV and history of anesthetic complications (PONV during C/S x2, otherwise no problems with prior GA)  Airway Mallampati: II  TM Distance: >3 FB Neck ROM: Full    Dental no notable dental hx.    Pulmonary neg pulmonary ROS,    Pulmonary exam normal        Cardiovascular negative cardio ROS Normal cardiovascular exam     Neuro/Psych negative neurological ROS  negative psych ROS   GI/Hepatic negative GI ROS, Neg liver ROS,   Endo/Other  negative endocrine ROS  Renal/GU negative Renal ROS  negative genitourinary   Musculoskeletal negative musculoskeletal ROS (+)   Abdominal   Peds  Hematology  (+) anemia , Hgb 11.4   Anesthesia Other Findings Day of surgery medications reviewed with patient.  Reproductive/Obstetrics Menorrhagia, endometrial polyp                            Anesthesia Physical Anesthesia Plan  ASA: II  Anesthesia Plan: General   Post-op Pain Management:    Induction: Intravenous  PONV Risk Score and Plan: 4 or greater and Treatment may vary due to age or medical condition, Ondansetron, Dexamethasone, Midazolam, TIVA, Propofol infusion and Scopolamine patch - Pre-op  Airway Management Planned: Oral ETT  Additional Equipment: None  Intra-op Plan:   Post-operative Plan: Extubation in OR  Informed Consent: I have reviewed the patients History and Physical, chart, labs and discussed the procedure including the risks, benefits and alternatives for the proposed anesthesia with the patient or authorized representative who has indicated his/her understanding and acceptance.     Dental advisory given  Plan Discussed with: CRNA  Anesthesia  Plan Comments:        Anesthesia Quick Evaluation

## 2019-04-26 ENCOUNTER — Ambulatory Visit (HOSPITAL_BASED_OUTPATIENT_CLINIC_OR_DEPARTMENT_OTHER): Payer: Medicaid Other | Admitting: Anesthesiology

## 2019-04-26 ENCOUNTER — Encounter (HOSPITAL_BASED_OUTPATIENT_CLINIC_OR_DEPARTMENT_OTHER): Payer: Self-pay | Admitting: Obstetrics & Gynecology

## 2019-04-26 ENCOUNTER — Ambulatory Visit (HOSPITAL_BASED_OUTPATIENT_CLINIC_OR_DEPARTMENT_OTHER)
Admission: RE | Admit: 2019-04-26 | Discharge: 2019-04-26 | Disposition: A | Discharge status: Home / Self Care | Payer: Medicaid Other | Attending: Obstetrics & Gynecology | Admitting: Obstetrics & Gynecology

## 2019-04-26 ENCOUNTER — Encounter (HOSPITAL_BASED_OUTPATIENT_CLINIC_OR_DEPARTMENT_OTHER)
Admission: RE | Disposition: A | Discharge status: Home / Self Care | Payer: Self-pay | Source: Home / Self Care | Attending: Obstetrics & Gynecology

## 2019-04-26 ENCOUNTER — Other Ambulatory Visit: Payer: Self-pay

## 2019-04-26 DIAGNOSIS — N800 Endometriosis of uterus: Secondary | ICD-10-CM

## 2019-04-26 DIAGNOSIS — N920 Excessive and frequent menstruation with regular cycle: Secondary | ICD-10-CM | POA: Insufficient documentation

## 2019-04-26 DIAGNOSIS — Z9889 Other specified postprocedural states: Secondary | ICD-10-CM

## 2019-04-26 DIAGNOSIS — K660 Peritoneal adhesions (postprocedural) (postinfection): Secondary | ICD-10-CM

## 2019-04-26 DIAGNOSIS — N809 Endometriosis, unspecified: Secondary | ICD-10-CM | POA: Insufficient documentation

## 2019-04-26 DIAGNOSIS — N840 Polyp of corpus uteri: Secondary | ICD-10-CM | POA: Insufficient documentation

## 2019-04-26 HISTORY — PX: ROBOTIC ASSISTED LAPAROSCOPIC LYSIS OF ADHESION: SHX6080

## 2019-04-26 HISTORY — PX: ROBOTIC ASSISTED TOTAL HYSTERECTOMY WITH BILATERAL SALPINGO OOPHERECTOMY: SHX6086

## 2019-04-26 LAB — TYPE AND SCREEN
ABO/RH(D): A POS
Antibody Screen: NEGATIVE

## 2019-04-26 LAB — CBC
HCT: 34.1 % — ABNORMAL LOW (ref 36.0–46.0)
Hemoglobin: 10.9 g/dL — ABNORMAL LOW (ref 12.0–15.0)
MCH: 28.2 pg (ref 26.0–34.0)
MCHC: 32 g/dL (ref 30.0–36.0)
MCV: 88.3 fL (ref 80.0–100.0)
Platelets: 280 10*3/uL (ref 150–400)
RBC: 3.86 MIL/uL — ABNORMAL LOW (ref 3.87–5.11)
RDW: 14 % (ref 11.5–15.5)
WBC: 10.5 10*3/uL (ref 4.0–10.5)
nRBC: 0 % (ref 0.0–0.2)

## 2019-04-26 LAB — POCT PREGNANCY, URINE: Preg Test, Ur: NEGATIVE

## 2019-04-26 SURGERY — HYSTERECTOMY, TOTAL, ROBOT-ASSISTED, LAPAROSCOPIC, WITH BILATERAL SALPINGO-OOPHORECTOMY
Anesthesia: General | Site: Abdomen

## 2019-04-26 MED ORDER — HYDROMORPHONE HCL 2 MG PO TABS: 2.0000 mg | ORAL_TABLET | Freq: Four times a day (QID) | ORAL | 0 refills | Status: DC | PRN

## 2019-04-26 MED ORDER — HYDROMORPHONE HCL 2 MG PO TABS
2.0000 mg | ORAL_TABLET | ORAL | Status: DC | PRN
  Administered 2019-04-26 (×2): 2 mg via ORAL
  Filled 2019-04-26: qty 1

## 2019-04-26 MED ORDER — HYDROMORPHONE HCL 2 MG PO TABS
ORAL_TABLET | ORAL | Status: AC
  Filled 2019-04-26: qty 1

## 2019-04-26 MED ORDER — PROPOFOL 500 MG/50ML IV EMUL
INTRAVENOUS | Status: AC
  Filled 2019-04-26: qty 50

## 2019-04-26 MED ORDER — ACETAMINOPHEN 500 MG PO TABS
1000.0000 mg | ORAL_TABLET | Freq: Once | ORAL | Status: AC
  Administered 2019-04-26: 1000 mg via ORAL
  Filled 2019-04-26: qty 2

## 2019-04-26 MED ORDER — SCOPOLAMINE 1 MG/3DAYS TD PT72
MEDICATED_PATCH | TRANSDERMAL | Status: AC
  Filled 2019-04-26: qty 1

## 2019-04-26 MED ORDER — KETOROLAC TROMETHAMINE 30 MG/ML IJ SOLN
30.0000 mg | Freq: Once | INTRAMUSCULAR | Status: AC | PRN
  Administered 2019-04-26: 30 mg via INTRAVENOUS
  Filled 2019-04-26: qty 1

## 2019-04-26 MED ORDER — DEXAMETHASONE SODIUM PHOSPHATE 10 MG/ML IJ SOLN
INTRAMUSCULAR | Status: AC
  Filled 2019-04-26: qty 1

## 2019-04-26 MED ORDER — ROCURONIUM BROMIDE 10 MG/ML (PF) SYRINGE
PREFILLED_SYRINGE | INTRAVENOUS | Status: AC
  Filled 2019-04-26: qty 10

## 2019-04-26 MED ORDER — FENTANYL CITRATE (PF) 250 MCG/5ML IJ SOLN
INTRAMUSCULAR | Status: DC | PRN
  Administered 2019-04-26: 50 ug via INTRAVENOUS
  Administered 2019-04-26 (×2): 100 ug via INTRAVENOUS

## 2019-04-26 MED ORDER — TRAMADOL HCL 50 MG PO TABS
50.0000 mg | ORAL_TABLET | Freq: Four times a day (QID) | ORAL | Status: DC | PRN
  Filled 2019-04-26: qty 1

## 2019-04-26 MED ORDER — PROPOFOL 500 MG/50ML IV EMUL
INTRAVENOUS | Status: DC | PRN
  Administered 2019-04-26: 150 ug/kg/min via INTRAVENOUS

## 2019-04-26 MED ORDER — MIDAZOLAM HCL 2 MG/2ML IJ SOLN
INTRAMUSCULAR | Status: DC | PRN
  Administered 2019-04-26: 2 mg via INTRAVENOUS

## 2019-04-26 MED ORDER — HYDROMORPHONE HCL 1 MG/ML IJ SOLN
0.2500 mg | INTRAMUSCULAR | Status: DC | PRN
  Administered 2019-04-26 (×3): 0.25 mg via INTRAVENOUS
  Filled 2019-04-26: qty 0.5

## 2019-04-26 MED ORDER — ACETAMINOPHEN 500 MG PO TABS
ORAL_TABLET | ORAL | Status: AC
  Filled 2019-04-26: qty 2

## 2019-04-26 MED ORDER — FENTANYL CITRATE (PF) 250 MCG/5ML IJ SOLN
INTRAMUSCULAR | Status: AC
  Filled 2019-04-26: qty 5

## 2019-04-26 MED ORDER — BUPIVACAINE HCL (PF) 0.25 % IJ SOLN
INTRAMUSCULAR | Status: DC | PRN
  Administered 2019-04-26: 10 mL

## 2019-04-26 MED ORDER — PROPOFOL 500 MG/50ML IV EMUL
INTRAVENOUS | Status: AC
  Filled 2019-04-26: qty 200

## 2019-04-26 MED ORDER — LIDOCAINE 2% (20 MG/ML) 5 ML SYRINGE
INTRAMUSCULAR | Status: DC | PRN
  Administered 2019-04-26: 100 mg via INTRAVENOUS

## 2019-04-26 MED ORDER — CEFAZOLIN SODIUM-DEXTROSE 2-4 GM/100ML-% IV SOLN
INTRAVENOUS | Status: AC
  Filled 2019-04-26: qty 100

## 2019-04-26 MED ORDER — PROPOFOL 10 MG/ML IV BOLUS
INTRAVENOUS | Status: AC
  Filled 2019-04-26: qty 20

## 2019-04-26 MED ORDER — LIDOCAINE 20MG/ML (2%) 15 ML SYRINGE OPTIME
INTRAMUSCULAR | Status: DC | PRN
  Administered 2019-04-26: 1.5 mg/kg/h via INTRAVENOUS

## 2019-04-26 MED ORDER — LACTATED RINGERS IV SOLN
INTRAVENOUS | Status: DC
  Administered 2019-04-26: 125 mL/h via INTRAVENOUS
  Filled 2019-04-26: qty 1000

## 2019-04-26 MED ORDER — IBUPROFEN 600 MG PO TABS: 600.0000 mg | ORAL_TABLET | Freq: Four times a day (QID) | ORAL | 1 refills | Status: DC | PRN

## 2019-04-26 MED ORDER — ROCURONIUM BROMIDE 10 MG/ML (PF) SYRINGE
PREFILLED_SYRINGE | INTRAVENOUS | Status: DC | PRN
  Administered 2019-04-26: 20 mg via INTRAVENOUS
  Administered 2019-04-26: 60 mg via INTRAVENOUS
  Administered 2019-04-26 (×2): 20 mg via INTRAVENOUS

## 2019-04-26 MED ORDER — HYDROMORPHONE HCL 1 MG/ML IJ SOLN
INTRAMUSCULAR | Status: AC
  Filled 2019-04-26: qty 1

## 2019-04-26 MED ORDER — ONDANSETRON HCL 4 MG/2ML IJ SOLN
INTRAMUSCULAR | Status: DC | PRN
  Administered 2019-04-26: 4 mg via INTRAVENOUS

## 2019-04-26 MED ORDER — BUPIVACAINE HCL (PF) 0.25 % IJ SOLN
INTRAMUSCULAR | Status: AC
  Filled 2019-04-26: qty 30

## 2019-04-26 MED ORDER — LIDOCAINE 2% (20 MG/ML) 5 ML SYRINGE
INTRAMUSCULAR | Status: AC
  Filled 2019-04-26: qty 5

## 2019-04-26 MED ORDER — MIDAZOLAM HCL 2 MG/2ML IJ SOLN
INTRAMUSCULAR | Status: AC
  Filled 2019-04-26: qty 2

## 2019-04-26 MED ORDER — DEXAMETHASONE SODIUM PHOSPHATE 10 MG/ML IJ SOLN
INTRAMUSCULAR | Status: DC | PRN
  Administered 2019-04-26: 5 mg via INTRAVENOUS

## 2019-04-26 MED ORDER — HYDROCODONE-ACETAMINOPHEN 5-325 MG PO TABS
1.0000 | ORAL_TABLET | ORAL | Status: DC | PRN
  Filled 2019-04-26: qty 2

## 2019-04-26 MED ORDER — SCOPOLAMINE 1 MG/3DAYS TD PT72
1.0000 | MEDICATED_PATCH | Freq: Once | TRANSDERMAL | Status: DC
  Administered 2019-04-26: 1.5 mg via TRANSDERMAL
  Filled 2019-04-26: qty 1

## 2019-04-26 MED ORDER — KETOROLAC TROMETHAMINE 30 MG/ML IJ SOLN
INTRAMUSCULAR | Status: AC
  Filled 2019-04-26: qty 1

## 2019-04-26 MED ORDER — ONDANSETRON HCL 4 MG/2ML IJ SOLN
INTRAMUSCULAR | Status: AC
  Filled 2019-04-26: qty 2

## 2019-04-26 MED ORDER — LIDOCAINE HCL 2 % IJ SOLN
INTRAMUSCULAR | Status: AC
  Filled 2019-04-26: qty 20

## 2019-04-26 MED ORDER — CEFAZOLIN SODIUM-DEXTROSE 2-4 GM/100ML-% IV SOLN
2.0000 g | INTRAVENOUS | Status: AC
  Administered 2019-04-26: 2 g via INTRAVENOUS
  Filled 2019-04-26: qty 100

## 2019-04-26 MED ORDER — SUGAMMADEX SODIUM 200 MG/2ML IV SOLN
INTRAVENOUS | Status: DC | PRN
  Administered 2019-04-26: 200 mg via INTRAVENOUS

## 2019-04-26 MED ORDER — PROMETHAZINE HCL 25 MG/ML IJ SOLN
6.2500 mg | INTRAMUSCULAR | Status: DC | PRN
  Filled 2019-04-26: qty 1

## 2019-04-26 MED ORDER — PROPOFOL 10 MG/ML IV BOLUS
INTRAVENOUS | Status: DC | PRN
  Administered 2019-04-26: 170 mg via INTRAVENOUS
  Administered 2019-04-26: 30 mg via INTRAVENOUS

## 2019-04-26 MED ORDER — LACTATED RINGERS IV SOLN
INTRAVENOUS | Status: DC
  Filled 2019-04-26: qty 1000

## 2019-04-26 MED ORDER — KETAMINE HCL 10 MG/ML IJ SOLN
INTRAMUSCULAR | Status: AC
  Filled 2019-04-26: qty 1

## 2019-04-26 SURGICAL SUPPLY — 56 items
BARRIER ADHS 3X4 INTERCEED (GAUZE/BANDAGES/DRESSINGS) IMPLANT
CANISTER SUCT 3000ML PPV (MISCELLANEOUS) ×3 IMPLANT
CATH FOLEY 3WAY  5CC 16FR (CATHETERS) ×1
CATH FOLEY 3WAY 5CC 16FR (CATHETERS) ×2 IMPLANT
COVER BACK TABLE 60X90IN (DRAPES) ×3 IMPLANT
COVER TIP SHEARS 8 DVNC (MISCELLANEOUS) ×2 IMPLANT
COVER TIP SHEARS 8MM DA VINCI (MISCELLANEOUS) ×1
DECANTER SPIKE VIAL GLASS SM (MISCELLANEOUS) ×6 IMPLANT
DEFOGGER SCOPE WARMER CLEARIFY (MISCELLANEOUS) ×3 IMPLANT
DERMABOND ADVANCED (GAUZE/BANDAGES/DRESSINGS) ×1
DERMABOND ADVANCED .7 DNX12 (GAUZE/BANDAGES/DRESSINGS) ×2 IMPLANT
DRAPE ARM DVNC X/XI (DISPOSABLE) ×8 IMPLANT
DRAPE COLUMN DVNC XI (DISPOSABLE) ×2 IMPLANT
DRAPE DA VINCI XI ARM (DISPOSABLE) ×4
DRAPE DA VINCI XI COLUMN (DISPOSABLE) ×1
DURAPREP 26ML APPLICATOR (WOUND CARE) ×3 IMPLANT
ELECT REM PT RETURN 9FT ADLT (ELECTROSURGICAL) ×3
ELECTRODE REM PT RTRN 9FT ADLT (ELECTROSURGICAL) ×2 IMPLANT
GAUZE PETROLATUM 1 X8 (GAUZE/BANDAGES/DRESSINGS) ×4 IMPLANT
GLOVE BIO SURGEON STRL SZ 6.5 (GLOVE) ×9 IMPLANT
GLOVE BIOGEL PI IND STRL 7.0 (GLOVE) ×10 IMPLANT
GLOVE BIOGEL PI INDICATOR 7.0 (GLOVE) ×5
IRRIG SUCT STRYKERFLOW 2 WTIP (MISCELLANEOUS) ×3
IRRIGATION SUCT STRKRFLW 2 WTP (MISCELLANEOUS) ×2 IMPLANT
LEGGING LITHOTOMY PAIR STRL (DRAPES) ×3 IMPLANT
OBTURATOR OPTICAL STANDARD 8MM (TROCAR) ×1
OBTURATOR OPTICAL STND 8 DVNC (TROCAR) ×2
OBTURATOR OPTICALSTD 8 DVNC (TROCAR) ×2 IMPLANT
OCCLUDER COLPOPNEUMO (BALLOONS) ×3 IMPLANT
PACK ROBOT WH (CUSTOM PROCEDURE TRAY) ×3 IMPLANT
PACK ROBOTIC GOWN (GOWN DISPOSABLE) ×3 IMPLANT
PACK TRENDGUARD 450 HYBRID PRO (MISCELLANEOUS) IMPLANT
PAD PREP 24X48 CUFFED NSTRL (MISCELLANEOUS) ×3 IMPLANT
POUCH ENDO CATCH II 15MM (MISCELLANEOUS) IMPLANT
PROTECTOR NERVE ULNAR (MISCELLANEOUS) ×6 IMPLANT
RTRCTR WOUND ALEXIS 18CM SML (INSTRUMENTS)
SAVER CELL AAL HAEMONETICS (INSTRUMENTS) IMPLANT
SEAL CANN UNIV 5-8 DVNC XI (MISCELLANEOUS) ×8 IMPLANT
SEAL XI 5MM-8MM UNIVERSAL (MISCELLANEOUS) ×4
SET IRRIG Y TYPE TUR BLADDER L (SET/KITS/TRAYS/PACK) IMPLANT
SET TRI-LUMEN FLTR TB AIRSEAL (TUBING) ×3 IMPLANT
SUT ETHIBOND 0 (SUTURE) IMPLANT
SUT VIC AB 4-0 PS2 27 (SUTURE) ×9 IMPLANT
SUT VICRYL 0 UR6 27IN ABS (SUTURE) ×3 IMPLANT
SUT VLOC 180 0 9IN  GS21 (SUTURE) ×1
SUT VLOC 180 0 9IN GS21 (SUTURE) ×2 IMPLANT
SUT VLOC 180 2-0 6IN GS21 (SUTURE) IMPLANT
TIP RUMI ORANGE 6.7MMX12CM (TIP) IMPLANT
TIP UTERINE 5.1X6CM LAV DISP (MISCELLANEOUS) IMPLANT
TIP UTERINE 6.7X10CM GRN DISP (MISCELLANEOUS) IMPLANT
TIP UTERINE 6.7X6CM WHT DISP (MISCELLANEOUS) IMPLANT
TIP UTERINE 6.7X8CM BLUE DISP (MISCELLANEOUS) ×1 IMPLANT
TOWEL OR 17X26 10 PK STRL BLUE (TOWEL DISPOSABLE) ×3 IMPLANT
TRENDGUARD 450 HYBRID PRO PACK (MISCELLANEOUS) ×3
TROCAR PORT AIRSEAL 5X120 (TROCAR) ×3 IMPLANT
WATER STERILE IRR 1000ML POUR (IV SOLUTION) ×3 IMPLANT

## 2019-04-26 NOTE — Anesthesia Procedure Notes (Signed)
Procedure Name: Intubation Date/Time: 04/26/2019 8:32 AM Performed by: Lollie Sails, CRNA Pre-anesthesia Checklist: Patient identified, Emergency Drugs available, Suction available, Patient being monitored and Timeout performed Patient Re-evaluated:Patient Re-evaluated prior to induction Oxygen Delivery Method: Circle system utilized Preoxygenation: Pre-oxygenation with 100% oxygen Induction Type: IV induction Ventilation: Mask ventilation without difficulty Laryngoscope Size: Miller and 3 Grade View: Grade I Tube type: Oral Number of attempts: 1 Airway Equipment and Method: Stylet Placement Confirmation: ETT inserted through vocal cords under direct vision,  positive ETCO2 and breath sounds checked- equal and bilateral Secured at: 22 cm Tube secured with: Tape Dental Injury: Teeth and Oropharynx as per pre-operative assessment

## 2019-04-26 NOTE — Op Note (Signed)
Operative Note  04/26/2019  11:11 AM  PATIENT:  Gabriela Evans  39 y.o. female  PRE-OPERATIVE DIAGNOSIS:  Menorrhagia, Endometrial polyp,  POST-OPERATIVE DIAGNOSIS:  Menorrhagia, Endometrial polyp, Omental and Badder Adhesions  PROCEDURE:  Procedure(s): XI ROBOTIC ASSISTED TOTAL LAPAROSCOPIC HYSTERECTOMY WITH BILATERAL SALPINGECTOMY AND LYSIS OF ADHESIONS  SURGEON:  Surgeon(s): Joseph Pierini, MD [name], MD  ANESTHESIA:   general  FINDINGS: Omental adhesions with the anterior abdominal wall and adhesions between the bladder and the lower uterine segment.  Uterus normal.  Tubes and Ovaries normal bilaterally.  DESCRIPTION OF OPERATION: Under general anesthesia with endotracheal intubation the patient is in lithotomy position.  She is prepped with DuraPrep on the abdomen and with Betadine on the suprapubic, vulvar and vaginal areas.  She is draped as usual.  Timeout is done.  The vaginal exam reveals an anteverted uterus, normal size, mobile.  No adnexal mass felt.  The Foley is inserted in the bladder.  A #8 Rumi with a medium Koh ring are put in place in the uterus and on the cervix.  The other instruments are removed.      The supraumbilical area is infiltrated with Marcaine one quarter plain.  A 1.5 cm incision is made with a scalpel at that level.  The aponeurosis is grasped with cokers and opened with Mayo scissors.  The parietal peritoneum is opened bluntly with the finger.  A pursestring stitch of Vicryl 0 is done at the aponeurosis.  The Sheryle Hail is inserted at that level under direct vision.  Up pneumoperitoneum is created with CO2.  The camera is inserted.  We noted extensive omental adhesions with the anterior wall of the abdomen at the midline.  Port sites are clear on the left side.  We infiltrated the skin with Marcaine and make  2 mm incisions with the scalpel medially and distally in line with the umbilicus.  The robotic port is inserted under direct vision at the  distal incision and the assistant port is inserted under direct vision at the medial site.  We used an atraumatic clamp to open a window where the adhesions were filmy in order to access the right side.  We see that the port sites are clear on the right as well.  The laparoscopic clamp is removed.  The skin is infiltrated on the right side in line with the umbilicus medially and distally.  We make 8 mm incisions with the scalpel at both levels.  We inserted the robotic ports at those 2 sites under direct vision.  The patient is then positioned in deep Trendelenburg.  The camera is docked.  Targeting is done.  The other robotic arms are docked.  Robotic instruments are inserted under direct vision with the fenestrated clamp in the fourth arm, the scissors and the third arm and the bipolar PK in the first arm.  We go to the console.      We first proceeded with lysis of adhesions between the omentum and the anterior wall of the abdomen.  The pelvic anatomy is normal including the uterus which is normal in size and appearance, both tubes are normal in appearance and both ovaries are normal in size and appearance.  Adhesions are present between the bladder and the lower uterine segment.  Patient is status post 2 C-sections.  Both ureters are visualized in normal anatomic position with good peristalsis.      We will start on the right side with cauterization and section of the right mesosalpinx.  We then cauterized and sectioned the right utero-ovarian ligament.  We cauterized and sectioned the right round ligament.  We opened the broad ligament on the right side and stopped just before the uterine artery.  We continue to open the visceral peritoneum anteriorly and start lowering the bladder very carefully as it is adherent to the lower uterine segment.  We proceeded exactly the same way on the left side.  We further lower the bladder past the [facility] ring.  We then cauterized and sectioned the left uterine artery.  We  cauterized and sectioned the right uterine artery.  The vaginal occluder was inflated.  We opened the vagina circumferentially with the tip of the scissors above the Koh ring, first anteriorly then on either side and posteriorly.  The uterus was completely detached with both tubes and cervix.  The specimen was passed vaginally and sent to pathology.  We verified hemostasis and completed it with the fenestrated clamp.  Hemostasis was adequate at all levels.  We changed the instruments and the third arm with the cutting needle driver and in the first arm with the long tip.  We used a V-Loc 0 at 9 inches to close the vaginal vault.  We started at the right angle and did a running suture all the way to the left angle and then back to the midline.  The vagina was well closed and hemostasis was adequate at all levels.  We irrigated and suctioned the pelvic cavity.  Both ureters were seen with excellent peristalsis.  Urine was clear.  Pictures were taken before the hysterectomy and after.  All robotic instruments were removed under direct vision.  The robot was undocked.  We removed the ports under direct vision by laparoscopy.  The CO2 was evacuated.  The pursestring stitch was attached at the supraumbilical incision.  All incisions were closed with a subcuticular stitch of Vicryl 4-0.  Dermabond was added on all incisions.  The vaginal occluder was removed.  The patient was brought to recovery room in good and stable status.  ESTIMATED BLOOD LOSS: 50 mL   Intake/Output Summary (Last 24 hours) at 04/26/2019 1111 Last data filed at 04/26/2019 1110 Gross per 24 hour  Intake 1517.69 ml  Output 175 ml  Net 1342.69 ml     BLOOD ADMINISTERED:none   LOCAL MEDICATIONS USED:  MARCAINE     SPECIMEN:  Source of Specimen:  Uterus with cervix and bilateral tubes.  DISPOSITION OF SPECIMEN:  PATHOLOGY  COUNTS:  YES  PLAN OF CARE: Transfer to PACU  Marie-Lyne LavoieMD11:11 AM

## 2019-04-26 NOTE — Progress Notes (Signed)
PO 6 hours XI Robotic TLH/Bilateral Salpingectomy/Lysis of Adhesions  Subjective: Patient reports tolerating PO and no problems voiding.    Objective: I have reviewed patient's vital signs.  vital signs, intake and output and medications.  Vitals:   04/26/19 1230 04/26/19 1300  BP: 118/77 118/75  Pulse: (!) 55 66  Resp: 14 15  Temp: 98.1 F (36.7 C) 98 F (36.7 C)  SpO2: 100% 100%   No intake/output data recorded. Total I/O In: 2687.7 [P.O.:420; I.V.:1817.7; Other:350; IV Piggyback:100] Out: 850 [Urine:800; Blood:50]  Results for orders placed or performed during the hospital encounter of 04/26/19 (from the past 24 hour(s))  Pregnancy, urine POC     Status: None   Collection Time: 04/26/19  6:48 AM  Result Value Ref Range   Preg Test, Ur NEGATIVE NEGATIVE    EXAM General: alert and cooperative GI: normal findings: soft, non-tender.  Incisions intact. Extremities: no edema, redness or tenderness in the calves or thighs Vaginal Bleeding: none  Assessment: s/p Procedure(s): XI ROBOTIC ASSISTED TOTAL LAPAROSCOPIC HYSTERECTOMY WITH BILATERAL SALPINGECTOMY XI ROBOTIC ASSISTED LAPAROSCOPIC LYSIS OF ADHESION: stable, progressing well and tolerating diet Surgical findings reviewed with patient.  Plan: Discharge home  LOS: 0 days    [name], MD 04/26/2019 5:52 PM

## 2019-04-26 NOTE — Hospital Course (Signed)
PO 6 hours XI Robotic TLH/Bilateral Salpingectomy/Lysis of Adhesions  Subjective: Patient reports tolerating PO and no problems voiding.    Objective: I have reviewed patient's vital signs.  vital signs, intake and output, and medications.  Vitals:   04/26/19 1230 04/26/19 1300  BP: 118/77 118/75  Pulse: (!) 55 66  Resp: 14 15  Temp: 98.1 F (36.7 C) 98 F (36.7 C)  SpO2: 100% 100%   No intake/output data recorded. Total I/O In: 2687.7 [P.O.:420; I.V.:1817.7; Other:350; IV Piggyback:100] Out: 850 [Urine:800; Blood:50]  Results for orders placed or performed during the hospital encounter of 04/26/19 (from the past 24 hour(s))  Pregnancy, urine POC     Status: None   Collection Time: 04/26/19  6:48 AM  Result Value Ref Range   Preg Test, Ur NEGATIVE NEGATIVE    EXAM General: alert and cooperative GI: Soft, NT.  Incisions intact Vaginal Bleeding: none  Assessment: s/p Procedure(s): XI ROBOTIC ASSISTED TOTAL LAPAROSCOPIC HYSTERECTOMY WITH BILATERAL SALPINGECTOMY XI ROBOTIC ASSISTED LAPAROSCOPIC LYSIS OF ADHESION: stable, progressing well, and tolerating diet Pending CBC.  Surgical findings reviewed with patient.    Plan: Discharge home  LOS: 0 days    [name], MD 04/26/2019 5:36 PM

## 2019-04-26 NOTE — H&P (Signed)
Gabriela Evans is an 39 y.o. female. G2P2L2   RP: Preop XI Robotic TLH/Bilateral Salpingectomy  HPI: No change x last visit on 02/08/2019:  Longstanding Menorrhagia, worsening x >6 months.Ultrasoundwith Dr Bjorn Loser anendometrial polyp. Endometrial biopsy takenwas benign on 07/27/2018.Currently not sexually active. History of 2 cesarean sections which historically sound like low transverse as she was planning trial of labor with second pregnancy but infant was macrosomic and they elected for repeat C-section. Also has a history of perforation with IUD. It was placed and apparently did well initially but then started bleeding heavily and on evaluation was found that it migrated transuterine and required laparoscopy to move.No desire to preserve fertility.  Had Covid 01/2019.  No persistent or residual Sx.   Pertinent Gynecological History: Menses: flow is excessive with use of many pads or tampons on heaviest days Blood transfusions: none Sexually transmitted diseases: no past history Last pap: normal    Menstrual History:  Patient's last menstrual period was 04/18/2019.    Past Medical History:  Diagnosis Date  . Anemia   . Complication of anesthesia    nausea  . Kidney infection   . Other abnormal glucose    Gestational diabetes with both children  . Pneumonia    01-2019  . PONV (postoperative nausea and vomiting)     Past Surgical History:  Procedure Laterality Date  . CESAREAN SECTION     X2  . IUD inserted in OR    . PELVIC LAPAROSCOPY  2009   remove perforated IUD  . WISDOM TOOTH EXTRACTION      Family History  Problem Relation Age of Onset  . Diabetes Mother   . Thyroid disease Father   . Diabetes Father   . Thyroid disease Sister   . Thyroid disease Maternal Aunt   . Cancer Paternal Aunt     Social History:  reports that she has never smoked. She has never used smokeless tobacco. She reports current alcohol use. She reports that she does not  use drugs.  Allergies:  Allergies  Allergen Reactions  . Nickel Swelling  . Oxycodone-Acetaminophen Itching    Medications Prior to Admission  Medication Sig Dispense Refill Last Dose  . BLACK ELDERBERRY,BERRY-FLOWER, PO Take 1 capsule by mouth daily as needed (cold symptoms).    Past Month at Unknown time  . ferrous sulfate 325 (65 FE) MG tablet Take 325 mg by mouth daily with breakfast.   04/25/2019 at Unknown time  . ibuprofen (ADVIL) 200 MG tablet Take 400 mg by mouth every 6 (six) hours as needed for moderate pain.   Past Week at Unknown time  . Multiple Vitamins-Minerals (MULTIVITAMIN WITH MINERALS) tablet Take 1 tablet by mouth daily.   04/25/2019 at Unknown time  . Probiotic Product (PROBIOTIC PO) Take 1 tablet by mouth daily.   Past Week at Unknown time  . vitamin C (ASCORBIC ACID) 250 MG tablet Take 250 mg by mouth daily.   04/25/2019 at Unknown time    REVIEW OF SYSTEMS: A ROS was performed and pertinent positives and negatives are included in the history.  GENERAL: No fevers or chills. HEENT: No change in vision, no earache, sore throat or sinus congestion. NECK: No pain or stiffness. CARDIOVASCULAR: No chest pain or pressure. No palpitations. PULMONARY: No shortness of breath, cough or wheeze. GASTROINTESTINAL: No abdominal pain, nausea, vomiting or diarrhea, melena or bright red blood per rectum. GENITOURINARY: No urinary frequency, urgency, hesitancy or dysuria. MUSCULOSKELETAL: No joint or muscle pain, no back pain,  no recent trauma. DERMATOLOGIC: No rash, no itching, no lesions. ENDOCRINE: No polyuria, polydipsia, no heat or cold intolerance. No recent change in weight. HEMATOLOGICAL: No anemia or easy bruising or bleeding. NEUROLOGIC: No headache, seizures, numbness, tingling or weakness. PSYCHIATRIC: No depression, no loss of interest in normal activity or change in sleep pattern.     Blood pressure 134/84, pulse 72, temperature 98 F (36.7 C), temperature source Oral, resp.  rate 16, height 5\' 10"  (1.778 m), weight 94.7 kg, last menstrual period 04/18/2019, SpO2 100 %.  Physical Exam:  See office notes   Results for orders placed or performed during the hospital encounter of 04/26/19 (from the past 24 hour(s))  Pregnancy, urine POC     Status: None   Collection Time: 04/26/19  6:48 AM  Result Value Ref Range   Preg Test, Ur NEGATIVE NEGATIVE   Covid Negative  Pelvic US 07/27/2018: Ultrasoundtransvaginal shows uterus normal size and echotexture. Endometrial echo 6.7 mm with echogenic focus measuring 16 x 4 mm. Right and left ovaries normal. Cul-de-sac negative.  Sonohysterogram performed, sterile technique, easy catheter introduction, good distention withposterior filling defect 16 x 10 x 9 mm. Endometrial sample taken. Patient tolerated well.  EBx 07/27/2018: Endometrium, biopsy, uterus - ENDOMETRIAL POLYP. ENDOMETRIUM WITH PROGESTIN EFFECT. NO MALIGNANCY IDENTIFIED.   Assessment/Plan:  39 y.o. G2P1002   1. Menorrhagia with regular cycle Longstanding refractory menorrhagia.  History of uterine perforation with IUD displacement, removal by laparoscopy.  2 previous C-section.  No desire to preserve fertility.  Decision to proceed with an XI Robotic total laparoscopic hysterectomy with bilateral salpingectomy.  Preop preparation, surgical procedure with risks and benefits and postop management and precautions thoroughly reviewed with patient.  Patient's questions answered.  Patient understands and agrees with plan.  2. Endometrial polyp EBx benign.                         Patient was counseled as to the risk of surgery to include the following:  1. Infection (prohylactic antibiotics will be administered)  2. DVT/Pulmonary Embolism (prophylactic pneumo compression stockings will be used)  3.Trauma to internal organs requiring additional surgical procedure to repair any injury to internal organs requiring perhaps additional hospitalization  days.  4.Hemmorhage requiring transfusion and blood products which carry risks such as anaphylactic reaction, hepatitis and AIDS  Patient had received literature information on the procedure scheduled and all her questions were answered and fully accepts all risk.    Marie-Lyne Adon Gehlhausen 04/26/2019, 8:14 AM

## 2019-04-26 NOTE — Discharge Summary (Signed)
Physician Discharge Summary  Patient ID: Gabriela Evans MRN: [id number] DOB/AGE: February 27, 1980 39 y.o.  Admit date: 04/26/2019 Discharge date: 04/26/2019  Admission Diagnoses: Postoperative state [Z98.890] Post-operative state [Z98.890]   Discharge Diagnoses:  Active Problems:   Postoperative state   Post-operative state   Discharged Condition: good  Consults:None  Significant Diagnostic Studies: labs: CBC  Treatments:surgery: XI Robotic Total Laparoscopic Hysterectomy with Bilateral Salpingectomy and Lysis of Adhesions  Vitals:   04/26/19 1230 04/26/19 1300  BP: 118/77 118/75  Pulse: (!) 55 66  Resp: 14 15  Temp: 98.1 F (36.7 C) 98 F (36.7 C)  SpO2: 100% 100%     Total I/O In: 2687.7 [P.O.:420; I.V.:1817.7; Other:350; IV Piggyback:100] Out: 850 [Urine:800; Blood:50]   Hospital Course: Good  Discharge Exam: Normal postop  Disposition: Discharge disposition: 01-Home or Self Care       Discharge Instructions    Discharge patient   Complete by: As directed    Discharge disposition: 01-Home or Self Care   Discharge patient date: 04/26/2019       Allergies as of 04/26/2019      Reactions   Nickel Swelling   Oxycodone-acetaminophen Itching      Medication List    TAKE these medications   BLACK ELDERBERRY(BERRY-FLOWER) PO Take 1 capsule by mouth daily as needed (cold symptoms).   ferrous sulfate 325 (65 FE) MG tablet Take 325 mg by mouth daily with breakfast.   HYDROmorphone 2 MG tablet Commonly known as: Dilaudid Take 1 tablet (2 mg total) by mouth every 6 (six) hours as needed for severe pain.   ibuprofen 600 MG tablet Commonly known as: ADVIL Take 1 tablet (600 mg total) by mouth every 6 (six) hours as needed. What changed:   medication strength  how much to take  reasons to take this   multivitamin with minerals tablet Take 1 tablet by mouth daily.   PROBIOTIC PO Take 1 tablet by mouth daily.   vitamin C 250 MG tablet Commonly known  as: ASCORBIC ACID Take 250 mg by mouth daily.        Follow-up Information    [name], MD Follow up in 3 week(s).   Specialty: Obstetrics and Gynecology Contact information: [address] [place] [address] [phone]            Signed: [name] 04/26/2019, 5:46 PM

## 2019-04-26 NOTE — Anesthesia Postprocedure Evaluation (Signed)
Anesthesia Post Note  Patient: Gabriela Evans  Procedure(s) Performed: XI ROBOTIC ASSISTED TOTAL LAPAROSCOPIC HYSTERECTOMY WITH BILATERAL SALPINGECTOMY (Bilateral Abdomen) XI ROBOTIC ASSISTED LAPAROSCOPIC LYSIS OF ADHESION (N/A Abdomen)     Patient location during evaluation: PACU Anesthesia Type: General Level of consciousness: awake and alert and oriented Pain management: pain level controlled Vital Signs Assessment: post-procedure vital signs reviewed and stable Respiratory status: spontaneous breathing, nonlabored ventilation and respiratory function stable Cardiovascular status: blood pressure returned to baseline Postop Assessment: no apparent nausea or vomiting Anesthetic complications: no    Last Vitals:  Vitals:   04/26/19 1155 04/26/19 1200  BP:  127/78  Pulse: (!) 50 (!) 51  Resp: 15 16  Temp:    SpO2: 100% 100%    Last Pain:  Vitals:   04/26/19 1155  TempSrc:   PainSc: [place]

## 2019-04-26 NOTE — Transfer of Care (Signed)
Immediate Anesthesia Transfer of Care Note  Patient: Gabriela Evans  Procedure(s) Performed: XI ROBOTIC ASSISTED TOTAL LAPAROSCOPIC HYSTERECTOMY WITH BILATERAL SALPINGECTOMY (Bilateral Abdomen) XI ROBOTIC ASSISTED LAPAROSCOPIC LYSIS OF ADHESION (N/A Abdomen)  Patient Location: PACU  Anesthesia Type:General  Level of Consciousness: awake, drowsy and responds to stimulation  Airway & Oxygen Therapy: Patient Spontanous Breathing and Patient connected to face mask oxygen  Post-op Assessment: Report given to RN and Post -op Vital signs reviewed and stable  Post vital signs: Reviewed and stable  Last Vitals:  Vitals Value Taken Time  BP 127/74 04/26/19 1108  Temp    Pulse 57 04/26/19 1110  Resp 15 04/26/19 1110  SpO2 100 % 04/26/19 1110  Vitals shown include unvalidated device data.  Last Pain:  Vitals:   04/26/19 0717  TempSrc: Oral  PainSc: 0-No pain      Patients Stated Pain Goal: 5 ([id number])  Complications: No apparent anesthesia complications

## 2019-04-26 NOTE — Discharge Instructions (Signed)
Total Laparoscopic Hysterectomy, Care After This sheet gives you information about how to care for yourself after your procedure. Your health care provider may also give you more specific instructions. If you have problems or questions, contact your health care provider. What can I expect after the procedure? After the procedure, it is common to have:  Pain and bruising around your incisions.  A sore throat, if a breathing tube was used during surgery.  Fatigue.  Poor appetite.  Less interest in sex. If your ovaries were also removed, it is also common to have symptoms of menopause such as hot flashes, night sweats, and lack of sleep (insomnia). Follow these instructions at home: Bathing  Do not take baths, swim, or use a hot tub until your health care provider approves. You may need to only take showers for 2-3 weeks.  Keep your bandage (dressing) dry until your health care provider says it can be removed. Incision care   Follow instructions from your health care provider about how to take care of your incisions. Make sure you: ? Wash your hands with soap and water before you change your dressing. If soap and water are not available, use hand sanitizer. ? Change your dressing as told by your health care provider. ? Leave stitches (sutures), skin glue, or adhesive strips in place. These skin closures may need to stay in place for 2 weeks or longer. If adhesive strip edges start to loosen and curl up, you may trim the loose edges. Do not remove adhesive strips completely unless your health care provider tells you to do that.  Check your incision area every day for signs of infection. Check for: ? Redness, swelling, or pain. ? Fluid or blood. ? Warmth. ? Pus or a bad smell. Activity  Get plenty of rest and sleep.  Do not lift anything that is heavier than 10 lbs (4.5 kg) for one month after surgery, or as long as told by your health care provider.  Do not drive or use heavy  machinery while taking prescription pain medicine.  Do not drive for 24 hours if you were given a medicine to help you relax (sedative).  Return to your normal activities as told by your health care provider. Ask your health care provider what activities are safe for you. Lifestyle   Do not use any products that contain nicotine or tobacco, such as cigarettes and e-cigarettes. These can delay healing. If you need help quitting, ask your health care provider.  Do not drink alcohol until your health care provider approves. General instructions  Do not douche, use tampons, or have sex for at least 6 weeks, or as told by your health care provider.  Take over-the-counter and prescription medicines only as told by your health care provider.  To monitor yourself for a fever, take your temperature at least once a day during recovery.  If you struggle with physical or emotional changes after your procedure, speak with your health care provider or a therapist.  To prevent or treat constipation while you are taking prescription pain medicine, your health care provider may recommend that you: ? Drink enough fluid to keep your urine clear or pale yellow. ? Take over-the-counter or prescription medicines. ? Eat foods that are high in fiber, such as fresh fruits and vegetables, whole grains, and beans. ? Limit foods that are high in fat and processed sugars, such as fried and sweet foods.  Keep all follow-up visits as told by your health care provider.   This is important. Contact a health care provider if:  You have chills or a fever.  You have redness, swelling, or pain around an incision.  You have fluid or blood coming from an incision.  Your incision feels warm to the touch.  You have pus or a bad smell coming from an incision.  An incision breaks open.  You feel dizzy or light-headed.  You have pain or bleeding when you urinate.  You have diarrhea, nausea, or vomiting that does not  go away.  You have abnormal vaginal discharge.  You have a rash.  You have pain that does not get better with medicine. Get help right away if:  You have a fever and your symptoms suddenly get worse.  You have severe abdominal pain.  You have chest pain.  You have shortness of breath.  You faint.  You have pain, swelling, or redness on your leg.  You have heavy vaginal bleeding with blood clots. Summary  After the procedure it is common to have abdominal pain. Your provider will give you medication for this.  Do not take baths, swim, or use a hot tub until your health care provider approves.  Do not lift anything that is heavier than 10 lbs (4.5 kg) for one month after surgery, or as long as told by your health care provider.  Notify your provider if you have any signs or symptoms of infection after the procedure. This information is not intended to replace advice given to you by your health care provider. Make sure you discuss any questions you have with your health care provider. Document Revised: 12/18/2016 Document Reviewed: 03/18/2016 Elsevier Patient Education  2020 [place]. Total Laparoscopic Hysterectomy, Care After This sheet gives you information about how to care for yourself after your procedure. Your health care provider may also give you more specific instructions. If you have problems or questions, contact your health care provider. What can I expect after the procedure? After the procedure, it is common to have:  Pain and bruising around your incisions.  A sore throat, if a breathing tube was used during surgery.  Fatigue.  Poor appetite.  Less interest in sex. If your ovaries were also removed, it is also common to have symptoms of menopause such as hot flashes, night sweats, and lack of sleep (insomnia). Follow these instructions at home: Bathing  Do not take baths, swim, or use a hot tub until your health care provider approves. You may  need to only take showers for 2-3 weeks.  Keep your bandage (dressing) dry until your health care provider says it can be removed. Incision care   Follow instructions from your health care provider about how to take care of your incisions. Make sure you: ? Wash your hands with soap and water before you change your dressing. If soap and water are not available, use hand sanitizer. ? Change your dressing as told by your health care provider. ? Leave stitches (sutures), skin glue, or adhesive strips in place. These skin closures may need to stay in place for 2 weeks or longer. If adhesive strip edges start to loosen and curl up, you may trim the loose edges. Do not remove adhesive strips completely unless your health care provider tells you to do that.  Check your incision area every day for signs of infection. Check for: ? Redness, swelling, or pain. ? Fluid or blood. ? Warmth. ? Pus or a bad smell. Activity  Get plenty of rest and  sleep.  Do not lift anything that is heavier than 10 lbs (4.5 kg) for one month after surgery, or as long as told by your health care provider.  Do not drive or use heavy machinery while taking prescription pain medicine.  Do not drive for 24 hours if you were given a medicine to help you relax (sedative).  Return to your normal activities as told by your health care provider. Ask your health care provider what activities are safe for you. Lifestyle   Do not use any products that contain nicotine or tobacco, such as cigarettes and e-cigarettes. These can delay healing. If you need help quitting, ask your health care provider.  Do not drink alcohol until your health care provider approves. General instructions  Do not douche, use tampons, or have sex for at least 6 weeks, or as told by your health care provider.  Take over-the-counter and prescription medicines only as told by your health care provider.  To monitor yourself for a fever, take your  temperature at least once a day during recovery.  If you struggle with physical or emotional changes after your procedure, speak with your health care provider or a therapist.  To prevent or treat constipation while you are taking prescription pain medicine, your health care provider may recommend that you: ? Drink enough fluid to keep your urine clear or pale yellow. ? Take over-the-counter or prescription medicines. ? Eat foods that are high in fiber, such as fresh fruits and vegetables, whole grains, and beans. ? Limit foods that are high in fat and processed sugars, such as fried and sweet foods.  Keep all follow-up visits as told by your health care provider. This is important. Contact a health care provider if:  You have chills or a fever.  You have redness, swelling, or pain around an incision.  You have fluid or blood coming from an incision.  Your incision feels warm to the touch.  You have pus or a bad smell coming from an incision.  An incision breaks open.  You feel dizzy or light-headed.  You have pain or bleeding when you urinate.  You have diarrhea, nausea, or vomiting that does not go away.  You have abnormal vaginal discharge.  You have a rash.  You have pain that does not get better with medicine. Get help right away if:  You have a fever and your symptoms suddenly get worse.  You have severe abdominal pain.  You have chest pain.  You have shortness of breath.  You faint.  You have pain, swelling, or redness on your leg.  You have heavy vaginal bleeding with blood clots. Summary  After the procedure it is common to have abdominal pain. Your provider will give you medication for this.  Do not take baths, swim, or use a hot tub until your health care provider approves.  Do not lift anything that is heavier than 10 lbs (4.5 kg) for one month after surgery, or as long as told by your health care provider.  Notify your provider if you have any  signs or symptoms of infection after the procedure. This information is not intended to replace advice given to you by your health care provider. Make sure you discuss any questions you have with your health care provider. Document Revised: 12/18/2016 Document Reviewed: 03/18/2016 Elsevier Patient Education  [place] Instructions  Activity: Get plenty of rest for the remainder of the day. A responsible  individual must stay with you for 24 hours following the procedure.  For the next 24 hours, DO NOT: -Drive a car -Paediatric nurse -Drink alcoholic beverages -Take any medication unless instructed by your physician -Make any legal decisions or sign important papers.  Meals: Start with liquid foods such as gelatin or soup. Progress to regular foods as tolerated. Avoid greasy, spicy, heavy foods. If nausea and/or vomiting occur, drink only clear liquids until the nausea and/or vomiting subsides. Call your physician if vomiting continues.  Special Instructions/Symptoms: Your throat may feel dry or sore from the anesthesia or the breathing tube placed in your throat during surgery. If this causes discomfort, gargle with warm salt water. The discomfort should disappear within 24 hours.  If you had a scopolamine patch placed behind your ear for the management of post- operative nausea and/or vomiting:  1. The medication in the patch is effective for 72 hours, after which it should be removed.  Wrap patch in a tissue and discard in the trash. Wash hands thoroughly with soap and water. 2. You may remove the patch earlier than 72 hours if you experience unpleasant side effects which may include dry mouth, dizziness or visual disturbances. 3. Avoid touching the patch. Wash your hands with soap and water after contact with the patch.

## 2019-04-27 LAB — SURGICAL PATHOLOGY

## 2019-04-28 NOTE — Progress Notes (Signed)
Having some dizziness but experiences vertigo normally.

## 2019-05-10 ENCOUNTER — Telehealth: Payer: Self-pay | Admitting: *Deleted

## 2019-05-10 NOTE — Telephone Encounter (Signed)
Patient called post ROBOTIC ASSISTED LAPAROSCOPIC LYSIS OF ADHESION on 04/26/19. Called today asking me to relay she has noticed brown vaginal discharge and cramping x 2 days. Report she had no bleeding or brownish until this past weekend, and yesterday and today. She report running errands and being moving around more, I advised her to use OTC to help with the cramping. She asked if normal to have brown discharge/cramping? She also mentioned difficulty sleeping at night, falls asleep then wakes up and sometimes up for multiply hours. Asked if this all normal Please advise

## 2019-05-10 NOTE — Telephone Encounter (Signed)
Brownish discharge is within normal at 2 weeks postop.  Cramping might be from bowels.  Recommend to take it easy for a couple of days.  Check BMs/gas and modify nutrition if constipated...  Increase water intake as needed.  If no vaginal red bleeding, no severe pain, no fever, probably not concerning and I will see Gabriela Evans at [place] in a week.  If concerned, can advance Gabriela Evans postop visit.  Insomnia maybe related to changes in activities and anxiety.Marland KitchenMarland Kitchen

## 2019-05-11 NOTE — Telephone Encounter (Signed)
Patient informed with below note. 

## 2019-05-19 ENCOUNTER — Other Ambulatory Visit: Payer: Self-pay

## 2019-05-19 ENCOUNTER — Telehealth: Payer: Self-pay | Admitting: *Deleted

## 2019-05-19 ENCOUNTER — Ambulatory Visit
Admission: RE | Admit: 2019-05-19 | Discharge: 2019-05-19 | Disposition: A | Discharge status: Home / Self Care | Payer: Medicaid Other | Source: Ambulatory Visit | Attending: Obstetrics & Gynecology | Admitting: Obstetrics & Gynecology

## 2019-05-19 ENCOUNTER — Ambulatory Visit (INDEPENDENT_AMBULATORY_CARE_PROVIDER_SITE_OTHER): Payer: Medicaid Other | Admitting: Obstetrics & Gynecology

## 2019-05-19 ENCOUNTER — Other Ambulatory Visit: Payer: Self-pay | Admitting: Obstetrics & Gynecology

## 2019-05-19 ENCOUNTER — Encounter: Payer: Self-pay | Admitting: Obstetrics & Gynecology

## 2019-05-19 VITALS — BP 130/90

## 2019-05-19 DIAGNOSIS — N99820 Postprocedural hemorrhage and hematoma of a genitourinary system organ or structure following a genitourinary system procedure: Secondary | ICD-10-CM

## 2019-05-19 LAB — CBC
HCT: 37.4 % (ref 35.0–45.0)
Hemoglobin: 12 g/dL (ref 11.7–15.5)
MCH: 27.6 pg (ref 27.0–33.0)
MCHC: 32.1 g/dL (ref 32.0–36.0)
MCV: 86 fL (ref 80.0–100.0)
MPV: 11.5 fL (ref 7.5–12.5)
Platelets: 328 10*3/uL (ref 140–400)
RBC: 4.35 10*6/uL (ref 3.80–5.10)
RDW: 12.7 % (ref 11.0–15.0)
WBC: 6.9 10*3/uL (ref 3.8–10.8)

## 2019-05-19 IMAGING — US US PELVIS COMPLETE WITH TRANSVAGINAL
1 series · 14 of 25 positions shown · non-contrast
Comparison: [DATE]

CLINICAL DATA: Vaginal bleeding, RIGHT spotting for 2 weeks, post
robotic total laparoscopic hysterectomy on [DATE]



[Series 1: us pelvis complete with transvaginal · 0.25mm/px · 14 of 55 slices shown]
[im 1/55]
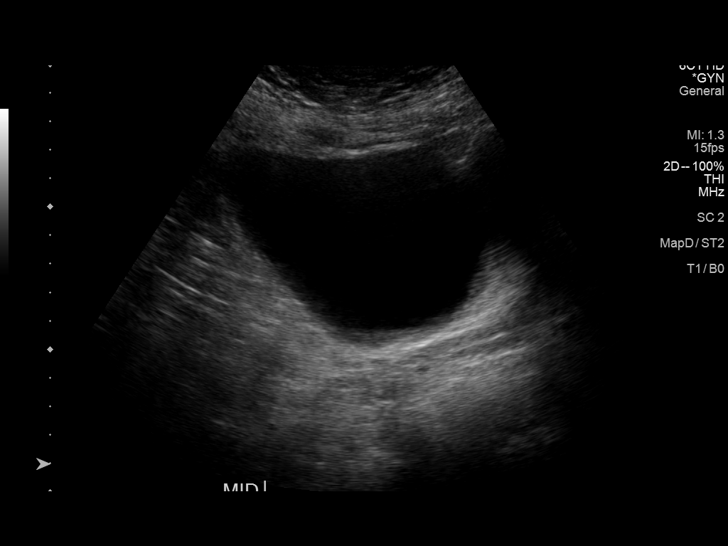
[im 5/55]
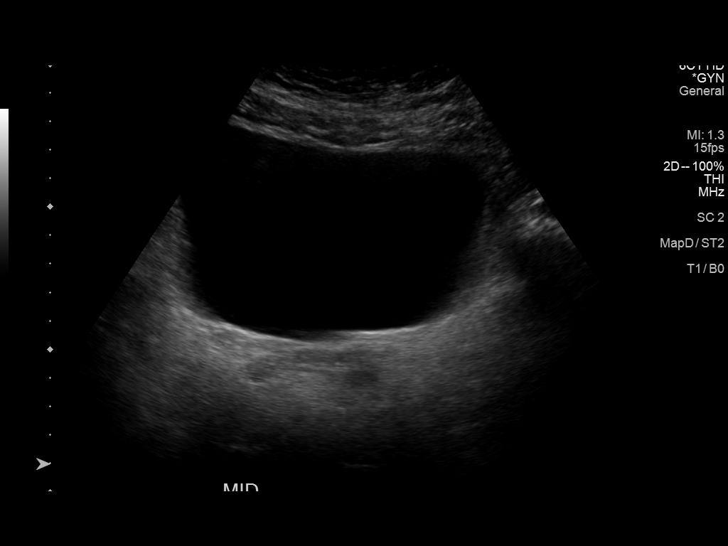
[im 10/55]
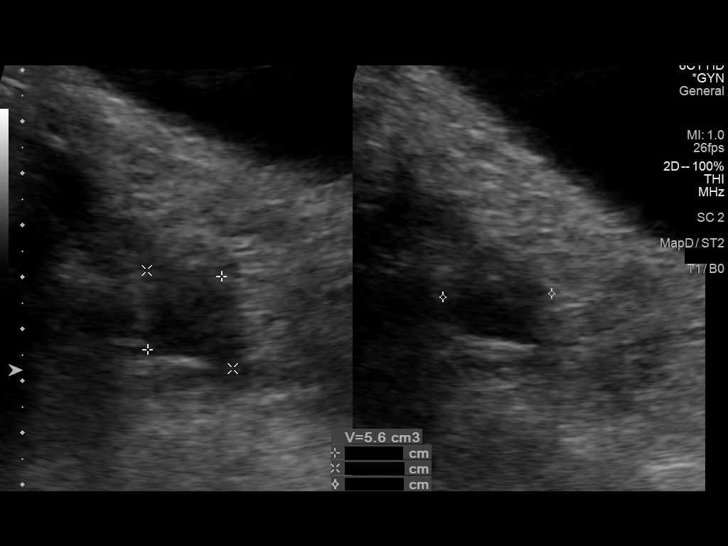
[im 14/55]
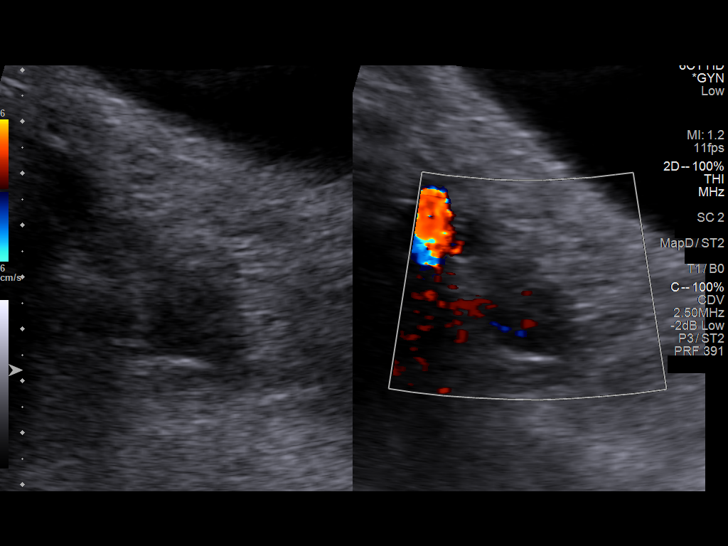
[im 19/55]
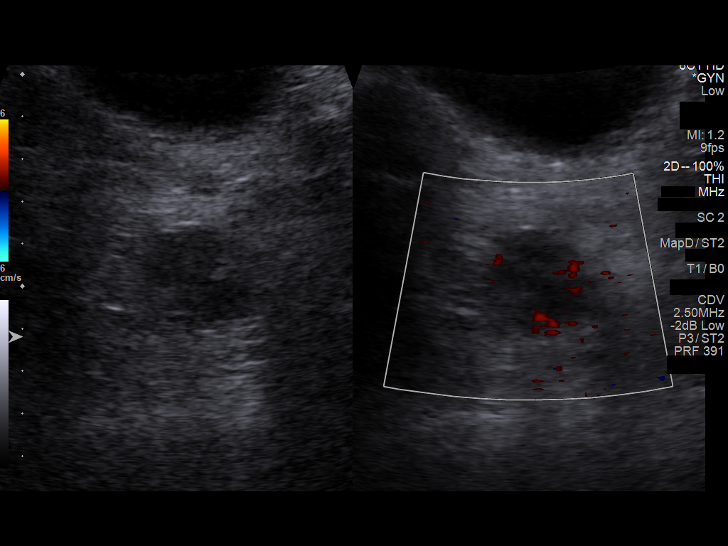
[im 21/55]
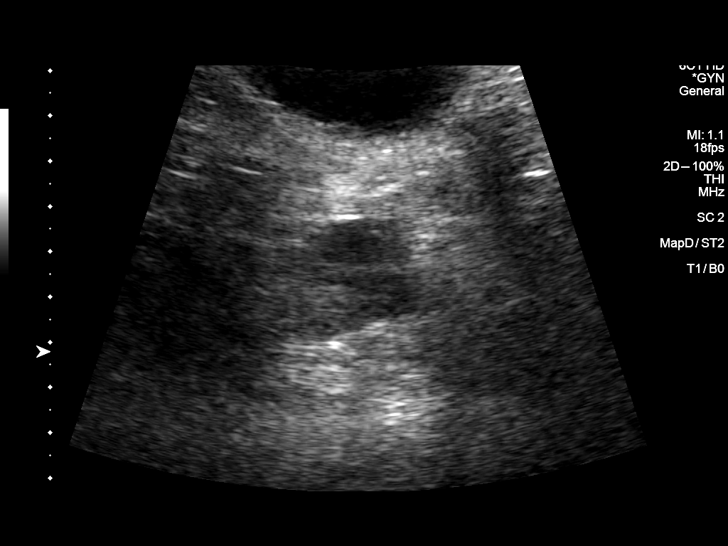
[im 25/55]
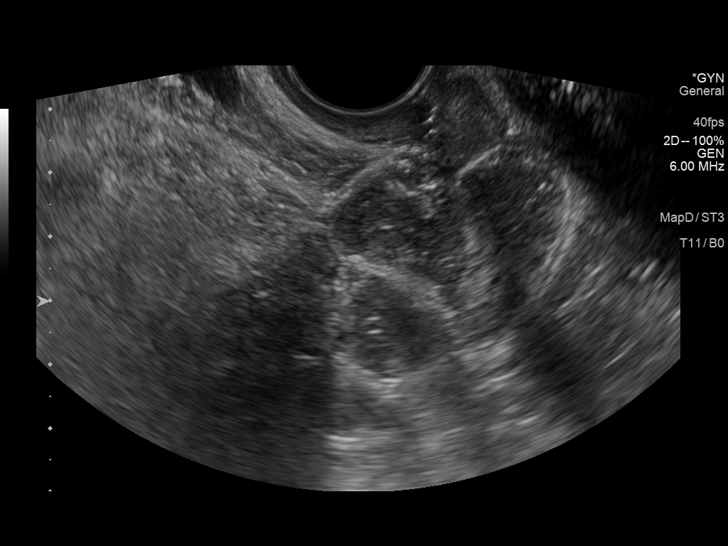
[im 30/55]
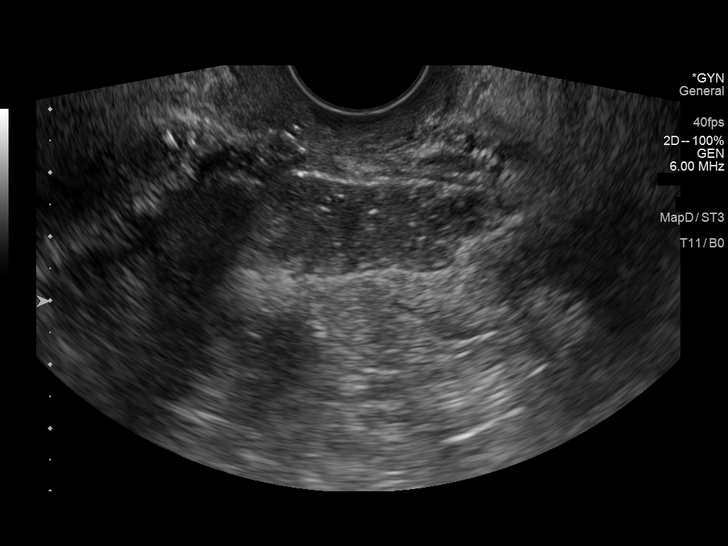
[im 34/55]
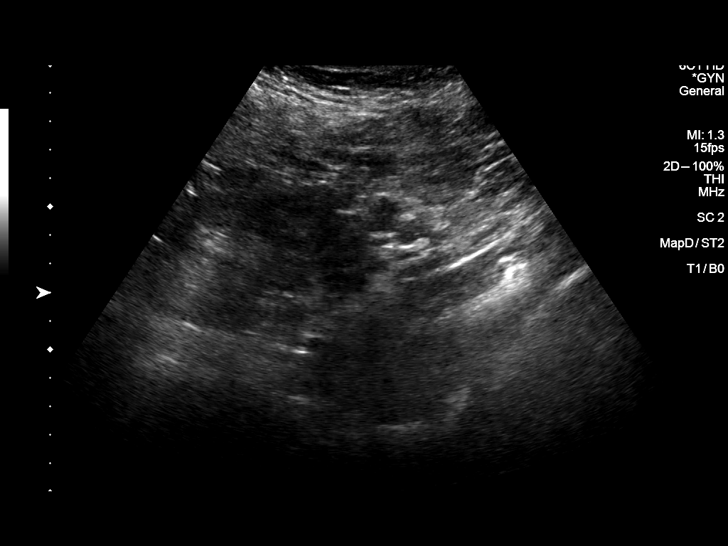
[im 37/55]
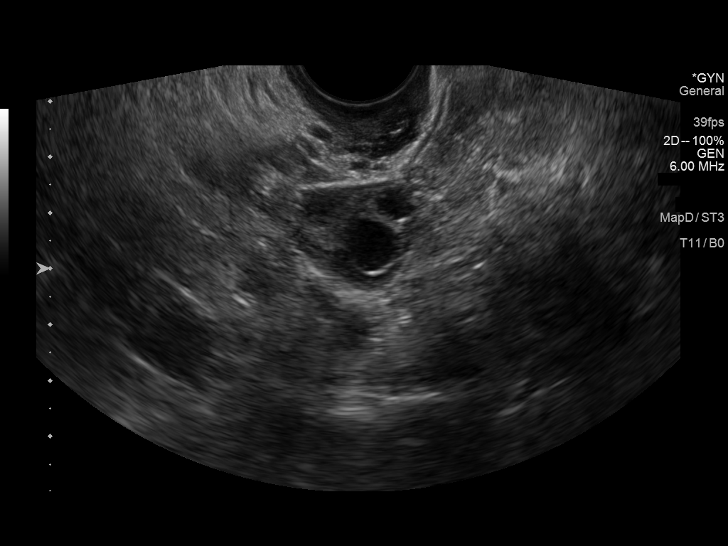
[im 41/55]
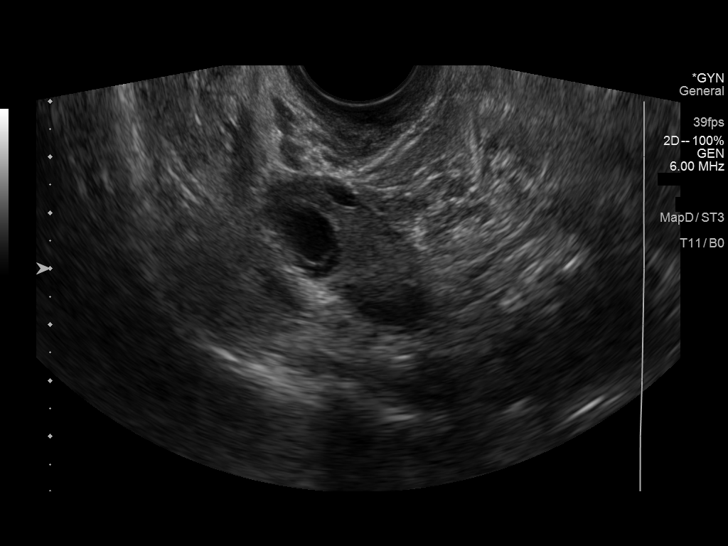
[im 46/55]
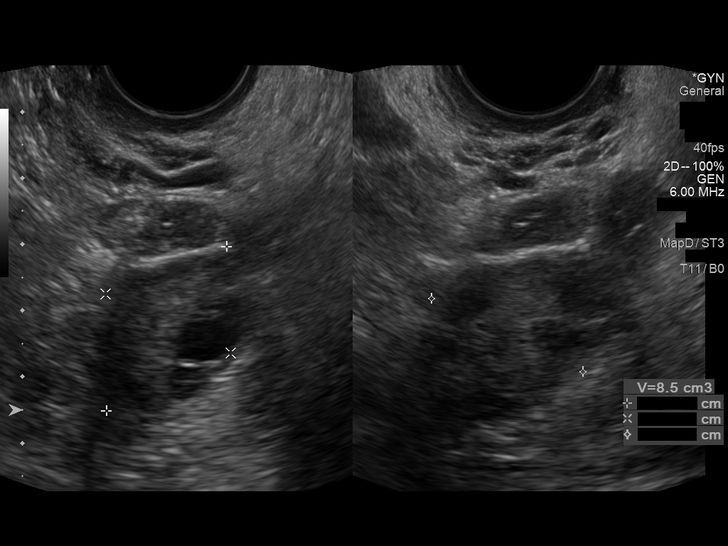
[im 50/55]
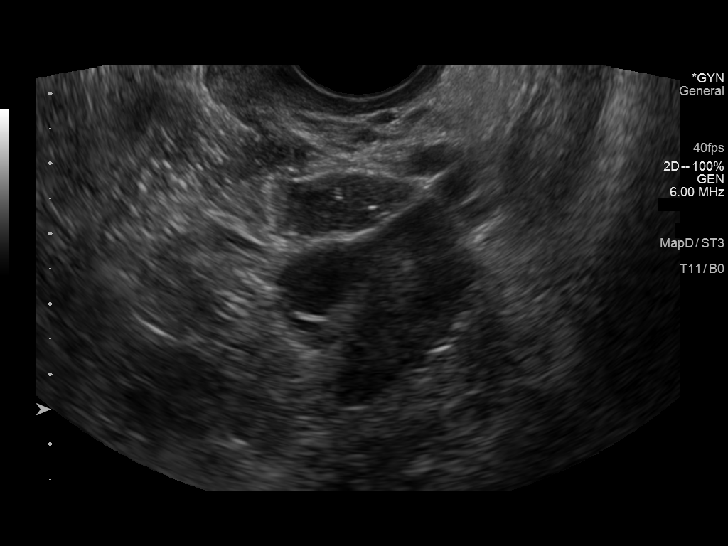
[im 55/55]
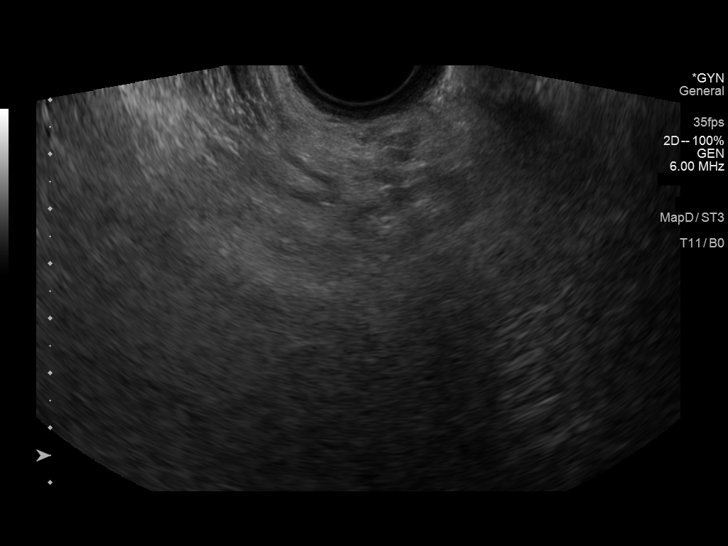

[14 of 25 positions shown; findings below may reference images not displayed]

FINDINGS: Uterus

Surgically absent

Endometrium

N/A

Right ovary

Measurements: 3.3 x 2.0 x 2.1 cm = volume: 7.0 mL. Normal morphology
without mass

Left ovary

Measurements: 3.1 x 2.1 x 2.5 cm = volume: 8.5 mL. Normal morphology
without mass

Other findings

No free pelvic fluid. No adnexal masses. Visualized vaginal cuff
unremarkable.
IMPRESSION: Post hysterectomy.

Otherwise normal exam.

## 2019-05-19 NOTE — Patient Instructions (Signed)
1. Postoperative vaginal bleeding following genitourinary procedure Postop XI TLH/bilateral salpingectomy April 26, 2019 with mild vaginal bleeding.  No evidence of vaginal cuff hematoma or abscess.  Vaginal cuff intact and nontender.  Small blood clots seen vaginally and no active bleeding after removal.  Will verify a CBC to rule out anemia.  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  Precautions discussed, recommend reevaluation for increased vaginal bleeding, abdominal/pelvic pain and fever.  Pelvic ultrasound early next week and follow-up with me in the office as well. - CBC - US PELVIS (TRANSABDOMINAL ONLY); Future  Gabriela Evans, it was a pleasure seeing you today!  I will inform you of your results as soon as they are available.

## 2019-05-19 NOTE — Progress Notes (Signed)
    Gabriela Evans 1980/02/23 [id number]        39 y.o.  G2P2L2   RP: Postop vaginal bleeding after XI Robotic TLH/Bilateral Salpingectomy 04/26/2019  HPI: Mild spotting for about 2 weeks with a mild increase in bleeding today.  No abdominal or pelvic pain.  No nausea/vomiting and not feeling bloated.  Urine and bowel movements normal.  No fever.  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   OB History  Gravida Para Term Preterm AB Living  2 2 1    0 2  SAB TAB Ectopic Multiple Live Births      0        # Outcome Date GA Lbr Len/2nd Weight Sex Delivery Anes PTL Lv  2 Term 2011    F CS-Unspec     1 Para 2009    M CS-Unspec       Past medical history,surgical history, problem list, medications, allergies, family history and social history were all reviewed and documented in the EPIC chart.   Directed ROS with pertinent positives and negatives documented in the history of present illness/assessment and plan.  Exam:  Vitals:   05/19/19 1057  BP: 130/90   General appearance:  Normal  Abdomen: Soft, not distended, nontender.  Gynecologic exam: Vulva normal.  Speculum: A small blood clot is present at the vaginal vault.  After removing it no active bleeding.  The vaginal vault is well closed with no sign of infection.  Bimanual exam done with no pelvic mass felt, no induration or pressure at the vaginal vault, nontender.     Assessment/Plan:  39 y.o. G2P1002   1. Postoperative vaginal bleeding following genitourinary procedure Postop XI TLH/bilateral salpingectomy April 26, 2019 with mild vaginal bleeding.  No evidence of vaginal cuff hematoma or abscess.  Vaginal cuff intact and nontender.  Small blood clots seen vaginally and no active bleeding after removal.  Will verify a CBC to rule out anemia.  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   Precautions discussed, recommend reevaluation for increased vaginal bleeding, abdominal/pelvic pain and fever.  Pelvic ultrasound early next week and follow-up with me in the office as well. - CBC - US PELVIS (TRANSABDOMINAL ONLY); Future  [name] MD, 11:18 AM 05/19/2019

## 2019-05-19 NOTE — Telephone Encounter (Signed)
Dr.Lavoie asked me to schedule pelvic ultrasound at [facility] for today. [facility] had a cancellation at 2:00pm patient need to have a full bladder. I was also given imaging code img [id number] for new order as the order placed by provider was not the order used by [facility]. Patient with all the above.

## 2019-05-19 NOTE — Telephone Encounter (Signed)
Patient called to follow up from telephone encounter on 05/10/19. Reports this am she went to bathroom and noticed bright red bleeding wiping and in the toilet, not heavy bleeding,but enough to wear a pad. patient is post surgery 04/26/19 ROBOTIC ASSISTED LAPAROSCOPIC LYSIS OF ADHESION . I spoke with Dr.Lavoie and she told patient come to office today sooner than later. I informed patient she lives in [place] and can be here at 11:00am.

## 2019-05-22 ENCOUNTER — Other Ambulatory Visit: Payer: Self-pay

## 2019-05-23 ENCOUNTER — Encounter: Payer: Self-pay | Admitting: Obstetrics & Gynecology

## 2019-05-23 ENCOUNTER — Ambulatory Visit (INDEPENDENT_AMBULATORY_CARE_PROVIDER_SITE_OTHER): Payer: Medicaid Other | Admitting: Obstetrics & Gynecology

## 2019-05-23 VITALS — BP 130/86

## 2019-05-23 DIAGNOSIS — Z09 Encounter for follow-up examination after completed treatment for conditions other than malignant neoplasm: Secondary | ICD-10-CM

## 2019-05-23 NOTE — Progress Notes (Signed)
    Gabriela Evans [birth year] [id number]        39 y.o.  G2P1002   RP:  Postop 3 wks Robotic TLH/Bilateral Salpingectomy  HPI: Patient presented for vaginal bleeding on Friday April 30.  The gynecologic exam showed a closed vaginal vault with no active bleeding but mild dark blood in the vagina.  A pelvic ultrasound was done confirming no hematoma or fluid collection/abscess at the vaginal cuff in the pelvis.  Patient's hemoglobin was good at 12 with normal white blood cell count.  Since then, patient was careful not to be too active physically and she had no further bleeding.  No pelvic pain.  No fever.  Urine and bowel movements are normal.   OB History  Gravida Para Term Preterm AB Living  2 2 1    0 2  SAB TAB Ectopic Multiple Live Births      0        # Outcome Date GA Lbr Len/2nd Weight Sex Delivery Anes PTL Lv  2 Term 2011    F CS-Unspec     1 Para 2009    M CS-Unspec       Past medical history,surgical history, problem list, medications, allergies, family history and social history were all reviewed and documented in the EPIC chart.   Directed ROS with pertinent positives and negatives documented in the history of present illness/assessment and plan.  Exam:  Vitals:   05/23/19 1403  BP: 130/86   General appearance:  Normal  Abdomen: Incision well closed with no sign of infection.  Soft, not distended.  Gynecologic exam: Vulva normal.  Speculum: Vaginal vault well closed with no blood.  No erythema.  Normal vaginal secretions.   Assessment/Plan:  39 y.o. G2P1002   1. Status post gynecological surgery, follow-up exam Good postop evolution with no further vaginal bleeding.  Vaginal vault healing well.  No complication.  Precautions reviewed with patient, recommend no physical activity for another week.  Will then be progressively more active physically.  No intercourse until follow-up in 4 weeks.   [name] MD, 2:05 PM 05/23/2019

## 2019-05-23 NOTE — Patient Instructions (Signed)
1. Status post gynecological surgery, follow-up exam Good postop evolution with no further vaginal bleeding.  Vaginal vault healing well.  No complication.  Precautions reviewed with patient, recommend no physical activity for another week.  Will then be progressively more active physically.  No intercourse until follow-up in 4 weeks.  Gabriela Evans, it was a pleasure seeing you today!

## 2019-06-14 ENCOUNTER — Other Ambulatory Visit: Payer: Self-pay

## 2019-06-15 ENCOUNTER — Encounter: Payer: Self-pay | Admitting: Obstetrics & Gynecology

## 2019-06-15 ENCOUNTER — Ambulatory Visit (INDEPENDENT_AMBULATORY_CARE_PROVIDER_SITE_OTHER): Payer: Medicaid Other | Admitting: Obstetrics & Gynecology

## 2019-06-15 VITALS — BP 136/84

## 2019-06-15 DIAGNOSIS — Z09 Encounter for follow-up examination after completed treatment for conditions other than malignant neoplasm: Secondary | ICD-10-CM

## 2019-06-15 NOTE — Patient Instructions (Signed)
1. Status post gynecological surgery, follow-up exam Very good healing postop with no complication.  Vaginal vault well-healed.  Can resume all physical activities.  Can resume sexual activities.  We will follow-up for annual gynecologic exam March 2022.  Gabriela Evans, it was a pleasure seeing you today!

## 2019-06-15 NOTE — Progress Notes (Signed)
    Gabriela Evans 12-30-1980 [id number]        39 y.o.  G2P1002   RP: Postop XI Robotic TLH/Bilateral Salpingectomy on 04/26/2019  HPI: Good postop healing with no vaginal bleeding since last visit.  Normal vaginal secretions.  No abdominal pelvic pain.  No fever.  Patient is back to work.  Started doing yoga.   OB History  Gravida Para Term Preterm AB Living  2 2 1    0 2  SAB TAB Ectopic Multiple Live Births      0        # Outcome Date GA Lbr Len/2nd Weight Sex Delivery Anes PTL Lv  2 Term 2011    F CS-Unspec     1 Para 2009    M CS-Unspec       Past medical history,surgical history, problem list, medications, allergies, family history and social history were all reviewed and documented in the EPIC chart.   Directed ROS with pertinent positives and negatives documented in the history of present illness/assessment and plan.  Exam:  Vitals:   06/15/19 1501  BP: 136/84   General appearance:  Normal  Abdomen: Incisions well-healed.  No sign of infection.  Suture trimmed on the medial right incision.  Gynecologic exam: Vulva normal.  Speculum: Vaginal vault well closed with no erythema.  No blood and normal secretions.  Bimanual exam: Vaginal vault well closed and no induration.  No pelvic mass.  Mildly tender, not painful.   Assessment/Plan:  39 y.o. G2P1002   1. Status post gynecological surgery, follow-up exam Very good healing postop with no complication.  Vaginal vault well-healed.  Can resume all physical activities.  Can resume sexual activities.  We will follow-up for annual gynecologic exam March 2022.  [name] MD, 3:07 PM 06/15/2019

## 2019-10-16 ENCOUNTER — Ambulatory Visit
Admission: RE | Admit: 2019-10-16 | Discharge: 2019-10-16 | Disposition: A | Discharge status: Home / Self Care | Payer: Medicaid Other | Source: Ambulatory Visit | Attending: Physician Assistant | Admitting: Physician Assistant

## 2019-10-16 ENCOUNTER — Other Ambulatory Visit: Payer: Self-pay

## 2019-10-16 ENCOUNTER — Ambulatory Visit (INDEPENDENT_AMBULATORY_CARE_PROVIDER_SITE_OTHER): Payer: Medicaid Other

## 2019-10-16 VITALS — BP 128/97 | HR 76 | Temp 98.6°F | Resp 18 | Ht 70.0 in | Wt 205.0 lb

## 2019-10-16 DIAGNOSIS — S299XXA Unspecified injury of thorax, initial encounter: Secondary | ICD-10-CM

## 2019-10-16 DIAGNOSIS — S20211A Contusion of right front wall of thorax, initial encounter: Secondary | ICD-10-CM

## 2019-10-16 DIAGNOSIS — R0781 Pleurodynia: Secondary | ICD-10-CM | POA: Diagnosis not present

## 2019-10-16 IMAGING — CR DG RIBS W/ CHEST 3+V*R*
3 series · 3 of 3 positions shown · non-contrast
Comparison: Chest radiographs [DATE]

CLINICAL DATA: Anterior right rib pain after an injury on
[DATE].

EXAM:
RIGHT RIBS AND CHEST - 3+ VIEW

[chest pa]
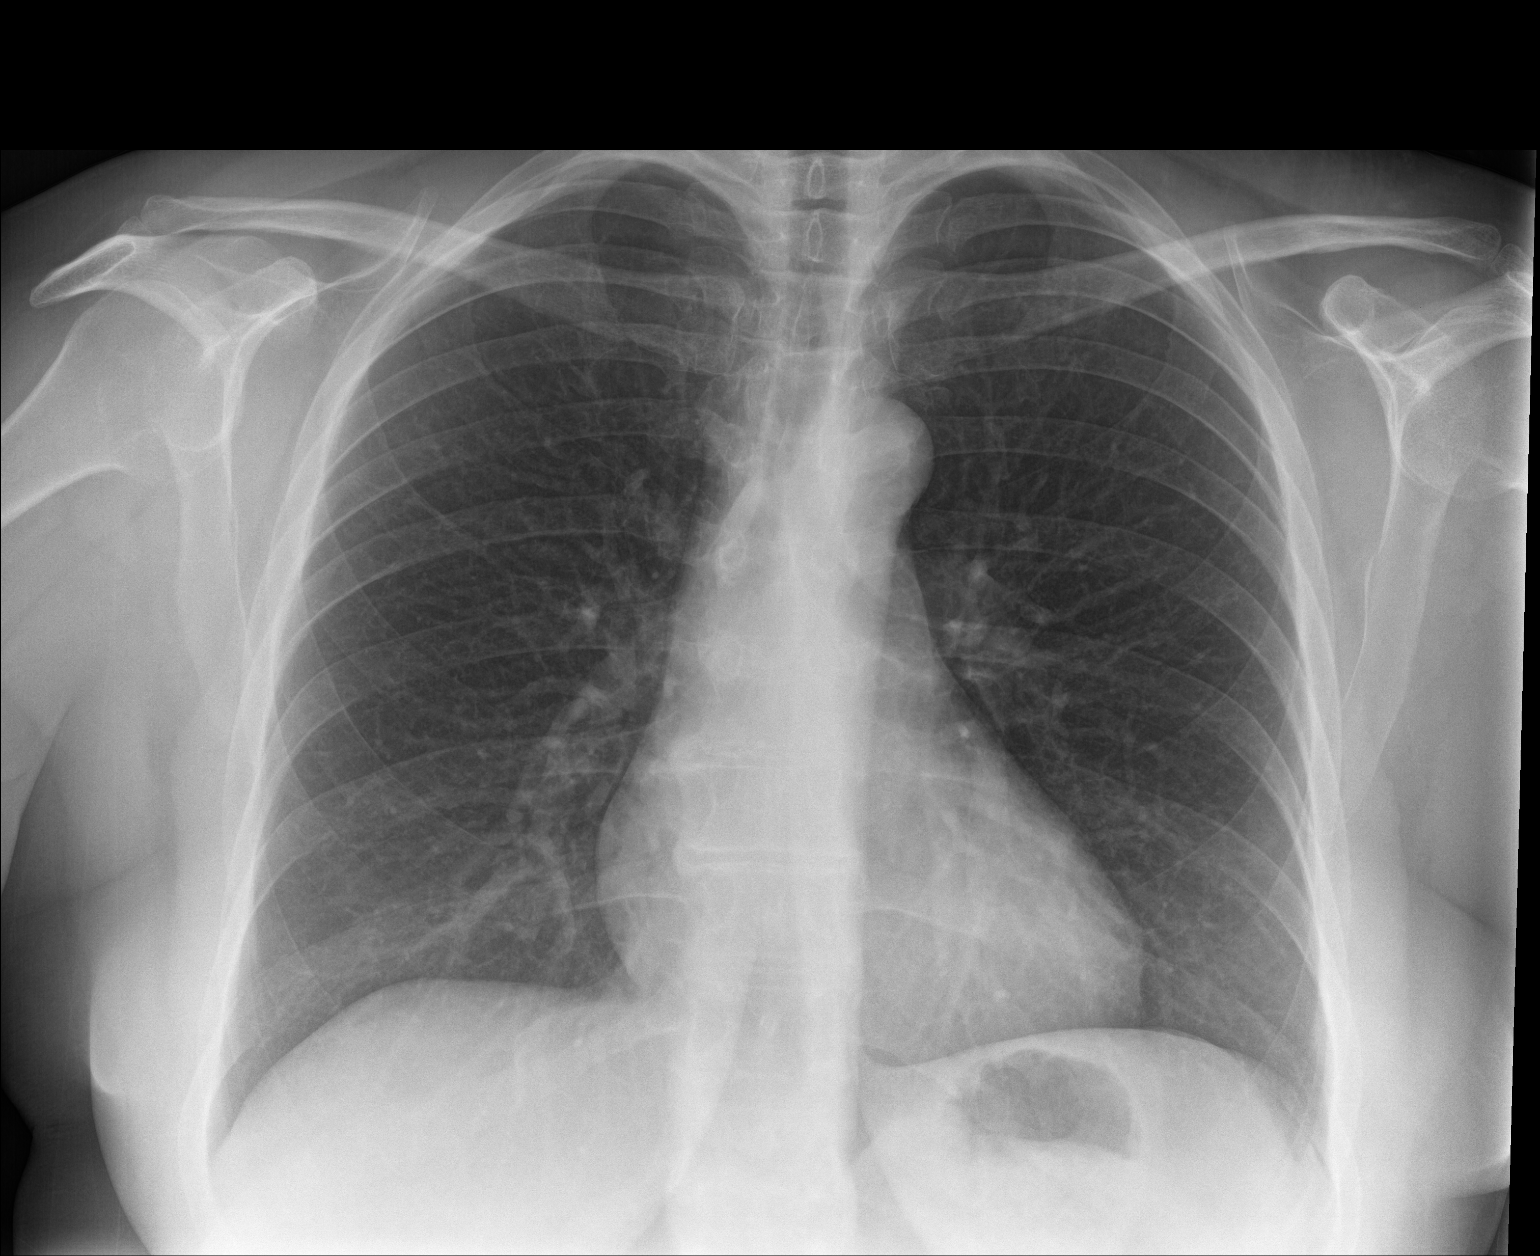

[rib pa]
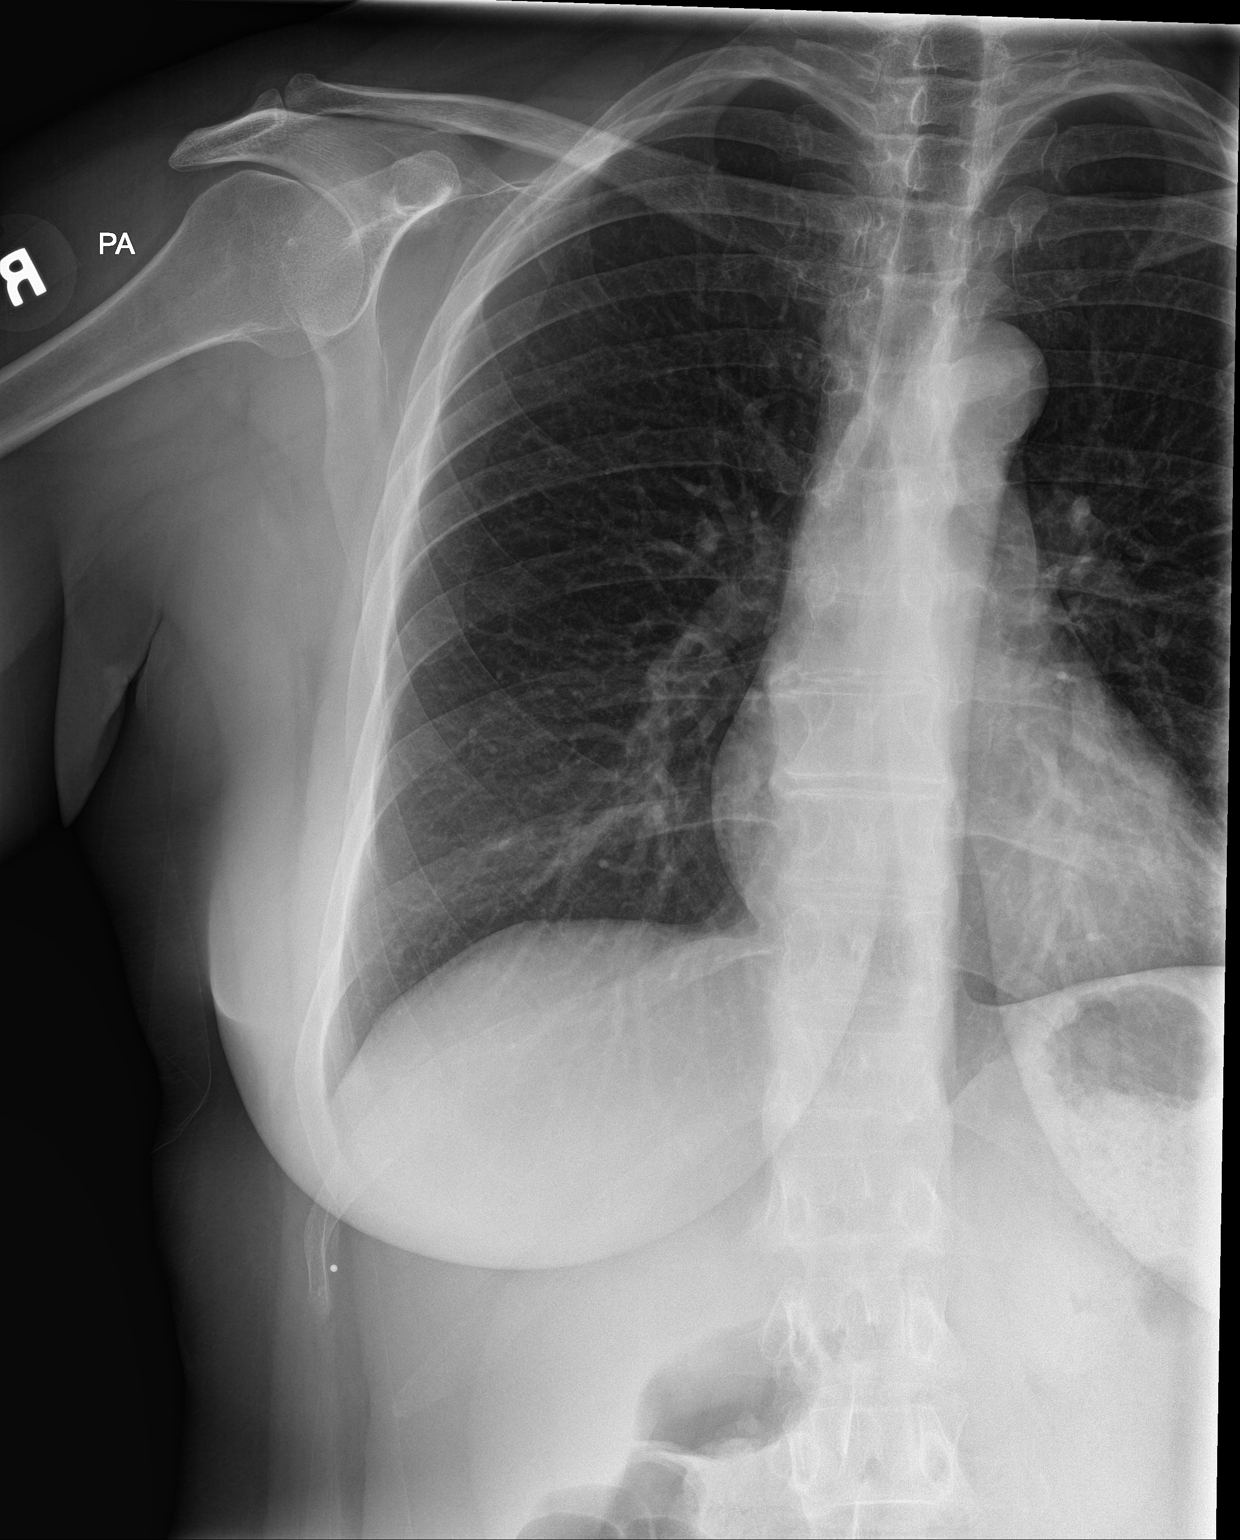

[rib obl]
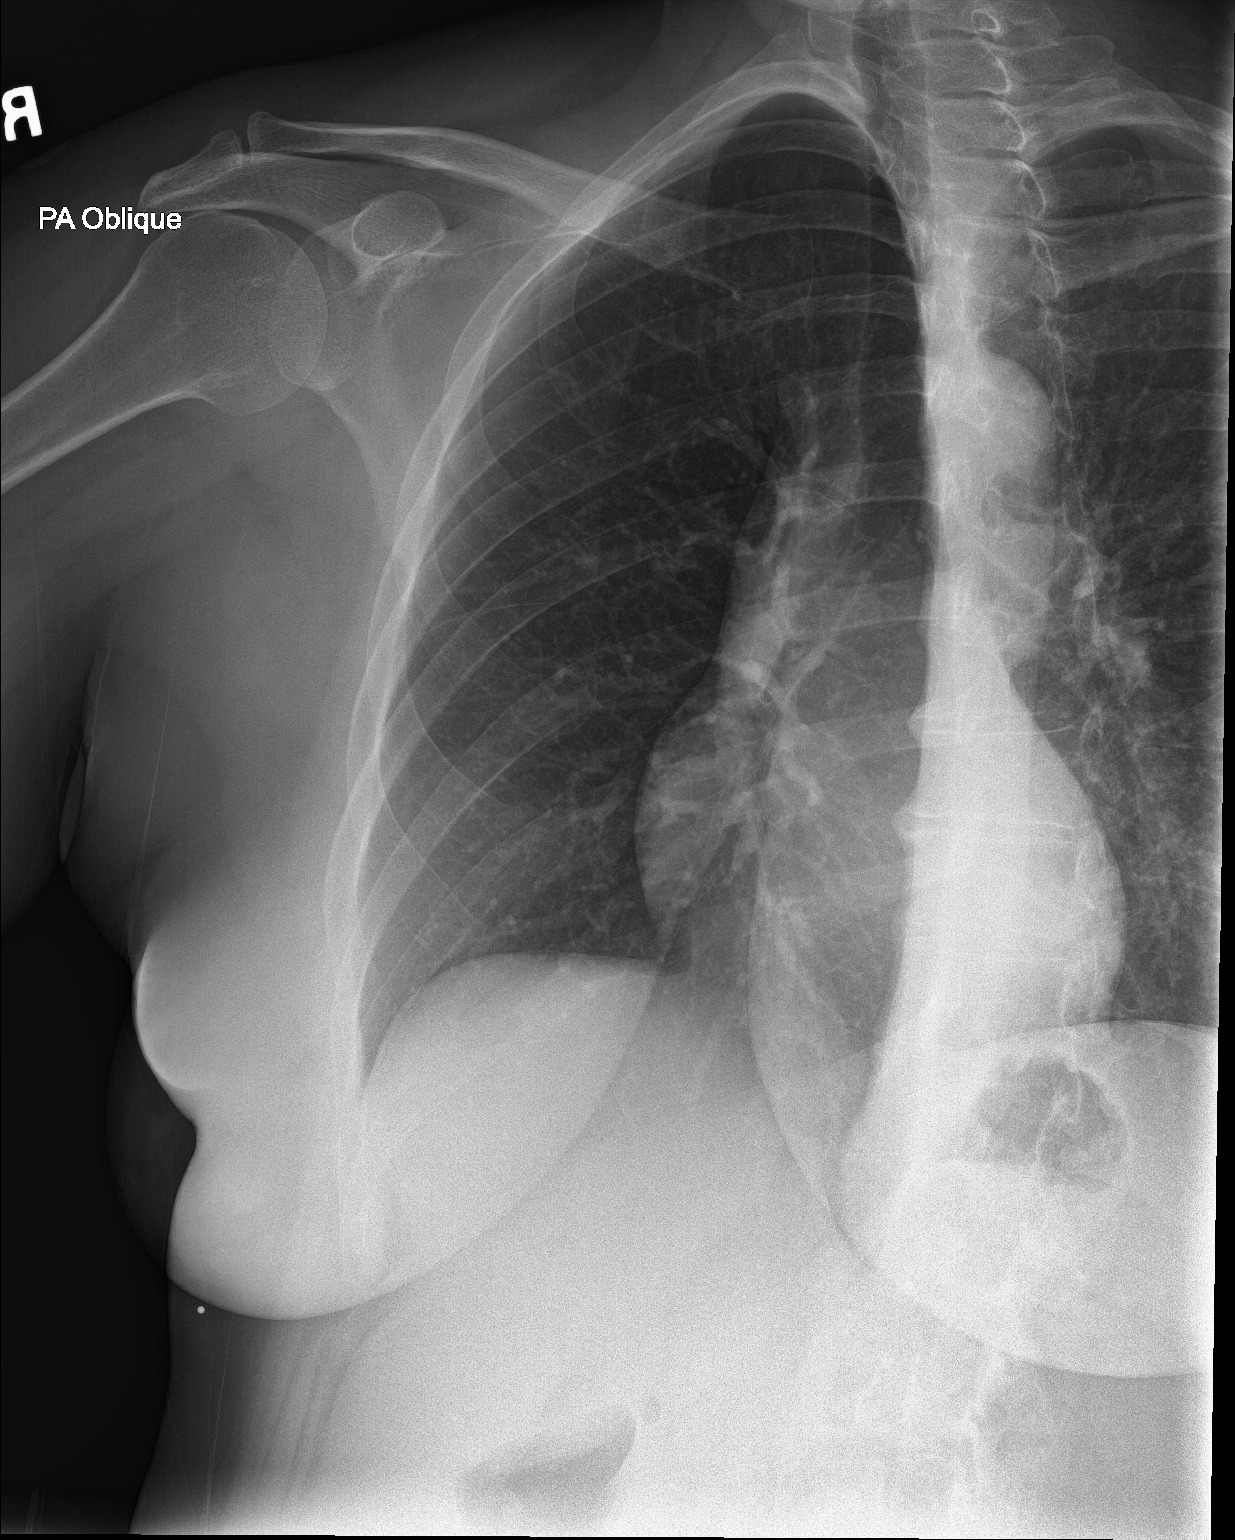

[3 of 3 positions shown; findings below may reference images not displayed]

FINDINGS: The cardiomediastinal silhouette is unchanged with normal heart
size. The lungs are well inflated and clear with interval resolution
of right upper lobe consolidation on the prior study. No pleural
effusion or pneumothorax is evident. There is irregularity of the
lateral right eleventh rib which is only clearly seen on a single
projection and is indeterminate for an acute minimally displaced
fracture versus sequelae of remote injury.
IMPRESSION: 1. Possible lateral right eleventh rib fracture.
2. No pneumothorax.
3. Interval resolution of right upper lobe pneumonia.

## 2019-10-16 MED ORDER — LIDOCAINE 5 % EX PTCH: 1.0000 | MEDICATED_PATCH | CUTANEOUS | 0 refills | Status: DC

## 2019-10-16 NOTE — ED Provider Notes (Signed)
[facility]    CSN: [id number] Arrival date & time: 10/16/19  1235      History   Chief Complaint Chief Complaint  Patient presents with  . Appointment  . Rib Injury    DOI 10/06/19    HPI Gabriela Evans is a 39 y.o. female presents today complaining of R lower rib pain. On 9/17 she had her son walk on her back and as he did this, she heard a pop on this area of her ribs. He weights about 120 lbs. She did not seek care since she knows there is nothing to do for rib injury. Has been taking Ibuprofen 800 mg since then, but is not helping her.  She worked 12h shift this weekend and at the end of it any thoracic movement and deep breaths caused severe pain. She does not work again for 3 days. Declined anything stronger for pain.  Has been wearing a rib belt but fall off when moving a lot.   Past Medical History:  Diagnosis Date  . Anemia   . Complication of anesthesia    nausea  . Kidney infection   . Other abnormal glucose    Gestational diabetes with both children  . Pneumonia    01-2019  . PONV (postoperative nausea and vomiting)     Patient Active Problem List   Diagnosis Date Noted  . Postoperative state 04/26/2019  . Post-operative state 04/26/2019  . Menorrhagia 03/21/2018    Past Surgical History:  Procedure Laterality Date  . CESAREAN SECTION     X2  . IUD inserted in OR    . PELVIC LAPAROSCOPY  2009   remove perforated IUD  . ROBOTIC ASSISTED LAPAROSCOPIC LYSIS OF ADHESION N/A 04/26/2019   Procedure: XI ROBOTIC ASSISTED LAPAROSCOPIC LYSIS OF ADHESION;  Surgeon: [name], MD;  Location: [place];  Service: Gynecology;  Laterality: N/A;  . ROBOTIC ASSISTED TOTAL HYSTERECTOMY WITH BILATERAL SALPINGO OOPHERECTOMY Bilateral 04/26/2019   Procedure: XI ROBOTIC ASSISTED TOTAL LAPAROSCOPIC HYSTERECTOMY WITH BILATERAL SALPINGECTOMY;  Surgeon: [name], MD;  Location: [place];  Service: Gynecology;   Laterality: Bilateral;  requests 8:30 OR start in [facility] block time requests 2 hours  . WISDOM TOOTH EXTRACTION      OB History    Gravida  2   Para  2   Term  1   Preterm      AB  0   Living  2     SAB      TAB      Ectopic  0   Multiple      Live Births               Home Medications    Prior to Admission medications   Medication Sig Start Date End Date Taking? Authorizing Provider  BLACK ELDERBERRY,BERRY-FLOWER, PO Take 1 capsule by mouth daily as needed (cold symptoms).    Yes [provider]  Multiple Vitamins-Minerals (MULTIVITAMIN WITH MINERALS) tablet Take 1 tablet by mouth daily.   Yes [provider]  Probiotic Product (PROBIOTIC PO) Take 1 tablet by mouth daily.   Yes [provider]  vitamin C (ASCORBIC ACID) 250 MG tablet Take 250 mg by mouth daily.   Yes [provider]  lidocaine (LIDODERM) 5 % Place 1 patch onto the skin daily. Remove & Discard patch within 12 hours or as directed by MD 10/16/19   Rodriguez-Southworth, Sunday Spillers, PA-C  ferrous sulfate 325 (65 FE)  MG tablet Take 325 mg by mouth daily with breakfast.  10/16/19  [provider]    Family History Family History  Problem Relation Age of Onset  . Diabetes Mother   . Thyroid disease Father   . Diabetes Father   . Thyroid disease Sister   . Thyroid disease Maternal Aunt   . Cancer Paternal Aunt     Social History Social History   Tobacco Use  . Smoking status: Never Smoker  . Smokeless tobacco: Never Used  Vaping Use  . Vaping Use: Never used  Substance Use Topics  . Alcohol use: Yes    Comment: occas  . Drug use: No     Allergies   Nickel and Oxycodone-acetaminophen   Review of Systems Review of Systems  Musculoskeletal: Negative for gait problem.  Skin: Negative for color change, rash and wound.   + for rib pain, pain provoked with deep breathing.  Physical Exam Triage Vital Signs ED Triage Vitals  Enc  Vitals Group     BP 10/16/19 1317 (!) 128/97     Pulse Rate 10/16/19 1317 76     Resp 10/16/19 1317 18     Temp 10/16/19 1317 98.6 F (37 C)     Temp Source 10/16/19 1317 Oral     SpO2 10/16/19 1317 100 %     Weight 10/16/19 1318 205 lb (93 kg)     Height 10/16/19 1318 5\' 10"  (1.778 m)     Head Circumference --      Peak Flow --      Pain Score 10/16/19 1316 3     Pain Loc --      Pain Edu? --      Excl. in [place]? --    No data found.  Updated Vital Signs BP (!) 128/97   Pulse 76   Temp 98.6 F (37 C) (Oral)   Resp 18   Ht 5\' 10"  (1.778 m)   Wt 205 lb (93 kg)   LMP 04/18/2019   SpO2 100%   BMI 29.41 kg/m   Visual Acuity Right Eye Distance:   Left Eye Distance:   Bilateral Distance:    Right Eye Near:   Left Eye Near:    Bilateral Near:     Physical Exam Vitals and nursing note reviewed.  Constitutional:      General: She is not in acute distress.    Appearance: She is obese. She is not toxic-appearing.  HENT:     Nose: Nose normal.  Eyes:     General: No scleral icterus.    Conjunctiva/sclera: Conjunctivae normal.  Cardiovascular:     Rate and Rhythm: Normal rate and regular rhythm.  Pulmonary:     Effort: Pulmonary effort is normal.     Breath sounds: Normal breath sounds.     Comments: Has point tender R lower rib, but no crepitations present. Skin does not show bruising or rash.  Musculoskeletal:        General: Normal range of motion.     Cervical back: Neck supple.  Skin:    General: Skin is warm and dry.  Neurological:     Mental Status: She is alert and oriented to person, place, and time.     Gait: Gait normal.  Psychiatric:        Mood and Affect: Mood normal.        Behavior: Behavior normal.        Thought Content: Thought content normal.  Judgment: Judgment normal.      UC Treatments / Results  Labs (all labs ordered are listed, but only abnormal results are displayed) Labs Reviewed - No data to  display  EKG   Radiology DG Ribs Unilateral W/Chest Right  Result Date: 10/16/2019 CLINICAL DATA:  Anterior right rib pain after an injury on 10/06/2019. EXAM: RIGHT RIBS AND CHEST - 3+ VIEW COMPARISON:  Chest radiographs 02/03/2019 FINDINGS: The cardiomediastinal silhouette is unchanged with normal heart size. The lungs are well inflated and clear with interval resolution of right upper lobe consolidation on the prior study. No pleural effusion or pneumothorax is evident. There is irregularity of the lateral right eleventh rib which is only clearly seen on a single projection and is indeterminate for an acute minimally displaced fracture versus sequelae of remote injury. IMPRESSION: 1. Possible lateral right eleventh rib fracture. 2. No pneumothorax. 3. Interval resolution of right upper lobe pneumonia. Electronically Signed   By: Logan Bores M.D.   On: 10/16/2019 14:10    Procedures Procedures (including critical care time)  Medications Ordered in UC Medications - No data to display  Initial Impression / Assessment and Plan / UC Course  I have reviewed the triage vital signs and the nursing notes. She has a possible 11th rib fracture. She will try Lidoderm patches. Declined to have anything stronger for pain.  Pertinent  imaging results that were available during my care of the patient were reviewed by me and considered in my medical decision making (see chart for details).  Final Clinical Impressions(s) / UC Diagnoses   Final diagnoses:  Rib contusion, right, initial encounter     Discharge Instructions     Follow up with your primary care provider as needed     ED Prescriptions    Medication Sig Dispense Auth. Provider   lidocaine (LIDODERM) 5 % Place 1 patch onto the skin daily. Remove & Discard patch within 12 hours or as directed by MD 30 patch Rodriguez-Southworth, Sunday Spillers, PA-C     PDMP not reviewed this encounter.   Shelby Mattocks, PA-C 10/16/19  1435

## 2019-10-16 NOTE — ED Triage Notes (Signed)
Patient in today c/o right sided rib pain after having an injury on 10/06/19. Patient states her son was walking on her back to pop it and she heard a pop in her ribs. Patient has been taking OTC Ibuprofen 800mg  without relief.

## 2019-10-16 NOTE — Discharge Instructions (Addendum)
Follow-up with your primary care provider as needed.

## 2020-10-07 ENCOUNTER — Ambulatory Visit: Payer: Self-pay | Admitting: Nurse Practitioner

## 2020-10-08 ENCOUNTER — Other Ambulatory Visit: Payer: Self-pay

## 2020-10-08 ENCOUNTER — Telehealth: Payer: Self-pay | Admitting: *Deleted

## 2020-10-08 ENCOUNTER — Encounter: Payer: Self-pay | Admitting: Nurse Practitioner

## 2020-10-08 ENCOUNTER — Ambulatory Visit (INDEPENDENT_AMBULATORY_CARE_PROVIDER_SITE_OTHER): Payer: Managed Care, Other (non HMO) | Admitting: Nurse Practitioner

## 2020-10-08 VITALS — BP 124/80 | Ht 68.0 in | Wt 209.0 lb

## 2020-10-08 DIAGNOSIS — Z8349 Family history of other endocrine, nutritional and metabolic diseases: Secondary | ICD-10-CM | POA: Diagnosis not present

## 2020-10-08 DIAGNOSIS — N644 Mastodynia: Secondary | ICD-10-CM | POA: Diagnosis not present

## 2020-10-08 DIAGNOSIS — Z01419 Encounter for gynecological examination (general) (routine) without abnormal findings: Secondary | ICD-10-CM | POA: Diagnosis not present

## 2020-10-08 DIAGNOSIS — Z8632 Personal history of gestational diabetes: Secondary | ICD-10-CM | POA: Diagnosis not present

## 2020-10-08 NOTE — Telephone Encounter (Signed)
-----   Message from Tamela Gammon, NP sent at 10/08/2020  1:39 PM EDT ----- Regarding: FW: Breast ultrasound referral I forgot to ask for this to be sent to [facility] per patient request. Thanks! ----- Message ----- From: Tamela Gammon, NP Sent: 10/08/2020  12:33 PM EDT To: [facility] Triage Subject: Breast ultrasound referral                     Please send referral for left breast ultrasound for pain. Thank you.

## 2020-10-08 NOTE — Telephone Encounter (Signed)
Patient scheduled on 10/10/20 at the breast center.

## 2020-10-08 NOTE — Progress Notes (Signed)
   Gabriela Evans 1980/10/01 [id number]   History:  40 y.o. G2P1002 presents for annual exam. April 2021 robotic-assisted TLH with bilateral salpingectomies for menorrhagia. Normal pap history. Complains of left breast pain x 2-3 weeks that is very uncomfortable at times. The pain is in outer upper region, feels swollen and has lumpy area. Denies nipple discharge, redness, or skin changes. 2 sisters with Hashimoto's. History of Gestational diabetes with both pregnancies.   Gynecologic History Patient's last menstrual period was 04/18/2019.   Contraception/Family planning: status post hysterectomy Sexually active: Yes  Health Maintenance Last Pap: 2021. Results were: Normal Last mammogram: Not indicated Last colonoscopy: Not indicated Last Dexa: Not indicated  Past medical history, past surgical history, family history and social history were all reviewed and documented in the EPIC chart. Married. 4 children (2 biological and 2 step). 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   ROS:  A ROS was performed and pertinent positives and negatives are included.  Exam:  Vitals:   10/08/20 1203  BP: 124/80  Weight: 209 lb (94.8 kg)  Height: 5\' 8"  (1.727 m)   Body mass index is 31.78 kg/m.  General appearance:  Normal Thyroid:  Symmetrical, normal in size, without palpable masses or nodularity. Respiratory  Auscultation:  Clear without wheezing or rhonchi Cardiovascular  Auscultation:  Regular rate, without rubs, murmurs or gallops  Edema/varicosities:  Not grossly evident Abdominal  Soft,nontender, without masses, guarding or rebound.  Liver/spleen:  No organomegaly noted  Hernia:  None appreciated  Skin  Inspection:  Grossly normal Breasts: Examined lying and sitting.   Right: Without masses, retractions, nipple discharge or axillary adenopathy.   Left: Tenderness to upper outer region. Large, mobile area c/w dense breast tissue. Without retractions, nipple discharge or axillary  adenopathy. Genitourinary   Inguinal/mons:  Normal without inguinal adenopathy  External genitalia:  Normal appearing vulva with no masses, tenderness, or lesions  BUS/Urethra/Skene's glands:  Normal  Vagina:  Normal appearing with normal color and discharge, no lesions  Cervix:  Absent  Uterus:  Absent  Adnexa/parametria:     Rt: Normal in size, without masses or tenderness.   Lt: Normal in size, without masses or tenderness.  Anus and perineum: Normal  Patient informed chaperone available to be present for breast and pelvic exam. Patient has requested no chaperone to be present. Patient has been advised what will be completed during breast and pelvic exam.   Assessment/Plan:  40 y.o. G2P1002 for annual exam.   Well female exam with routine gynecological exam - Plan: Comprehensive metabolic panel, CBC with Differential/Platelet. Education provided on SBEs, importance of preventative screenings, current guidelines, high calcium diet, regular exercise, and multivitamin daily.   Family history of thyroid disease in sister - Plan: Thyroid Panel With TSH, Thyroid Peroxidase Antibodies (TPO) (REFL). Two sisters with hashimoto's.  History of gestational diabetes - Plan: Hemoglobin A1c. GDM with both pregnancies.   Pain of left breast - Complains of left breast pain x 2-3 weeks that is very uncomfortable at times. The pain is in outer upper region, feels swollen and has lumpy area. Denies nipple discharge, redness, or skin changes. We will send referral for left breast ultrasound.   Screening for cervical cancer - Normal pap history. No longer screening per guidelines.   Return in 1 year for annual.   Tamela Gammon DNP, 12:25 PM 10/08/2020

## 2020-10-09 LAB — CBC WITH DIFFERENTIAL/PLATELET
Absolute Monocytes: 566 cells/uL (ref 200–950)
Basophils Absolute: 41 cells/uL (ref 0–200)
Basophils Relative: 0.5 %
Eosinophils Absolute: 205 cells/uL (ref 15–500)
Eosinophils Relative: 2.5 %
HCT: 38.5 % (ref 35.0–45.0)
Hemoglobin: 12.7 g/dL (ref 11.7–15.5)
Lymphs Abs: 2558 cells/uL (ref 850–3900)
MCH: 29.3 pg (ref 27.0–33.0)
MCHC: 33 g/dL (ref 32.0–36.0)
MCV: 88.9 fL (ref 80.0–100.0)
MPV: 11.8 fL (ref 7.5–12.5)
Monocytes Relative: 6.9 %
Neutro Abs: 4830 cells/uL (ref 1500–7800)
Neutrophils Relative %: 58.9 %
Platelets: 272 10*3/uL (ref 140–400)
RBC: 4.33 10*6/uL (ref 3.80–5.10)
RDW: 12.4 % (ref 11.0–15.0)
Total Lymphocyte: 31.2 %
WBC: 8.2 10*3/uL (ref 3.8–10.8)

## 2020-10-09 LAB — THYROID PEROXIDASE ANTIBODIES (TPO) (REFL): Thyroperoxidase Ab SerPl-aCnc: <1 IU/mL (ref <9)

## 2020-10-09 LAB — COMPREHENSIVE METABOLIC PANEL
AG Ratio: 1.8 (calc) (ref 1.0–2.5)
ALT: 14 U/L (ref 6–29)
AST: 16 U/L (ref 10–30)
Albumin: 4.4 g/dL (ref 3.6–5.1)
Alkaline phosphatase (APISO): 51 U/L (ref 31–125)
BUN: 14 mg/dL (ref 7–25)
CO2: 25 mmol/L (ref 20–32)
Calcium: 9.5 mg/dL (ref 8.6–10.2)
Chloride: 105 mmol/L (ref 98–110)
Creat: 0.72 mg/dL (ref 0.50–0.97)
Globulin: 2.4 g/dL (calc) (ref 1.9–3.7)
Glucose, Bld: 116 mg/dL — ABNORMAL HIGH (ref 65–99)
Potassium: 3.8 mmol/L (ref 3.5–5.3)
Sodium: 139 mmol/L (ref 135–146)
Total Bilirubin: 0.3 mg/dL (ref 0.2–1.2)
Total Protein: 6.8 g/dL (ref 6.1–8.1)

## 2020-10-09 LAB — THYROID PANEL WITH TSH
Free Thyroxine Index: 1.9 (ref 1.4–3.8)
T3 Uptake: 26 % (ref 22–35)
T4, Total: 7.3 ug/dL (ref 5.1–11.9)
TSH: 2.95 mIU/L

## 2020-10-09 LAB — HEMOGLOBIN A1C
Hgb A1c MFr Bld: 5.4 % of total Hgb (ref <5.7)
Mean Plasma Glucose: 108 mg/dL
eAG (mmol/L): 6 mmol/L

## 2020-10-10 ENCOUNTER — Other Ambulatory Visit: Payer: Self-pay

## 2020-10-10 ENCOUNTER — Ambulatory Visit
Admission: RE | Admit: 2020-10-10 | Discharge: 2020-10-10 | Disposition: A | Discharge status: Home / Self Care | Payer: Medicaid Other | Source: Ambulatory Visit | Attending: Nurse Practitioner | Admitting: Nurse Practitioner

## 2020-10-10 ENCOUNTER — Other Ambulatory Visit: Payer: Self-pay | Admitting: Nurse Practitioner

## 2020-10-10 ENCOUNTER — Ambulatory Visit
Admission: RE | Admit: 2020-10-10 | Discharge: 2020-10-10 | Disposition: A | Discharge status: Home / Self Care | Payer: Managed Care, Other (non HMO) | Source: Ambulatory Visit | Attending: Nurse Practitioner | Admitting: Nurse Practitioner

## 2020-10-10 DIAGNOSIS — N644 Mastodynia: Secondary | ICD-10-CM

## 2020-10-10 DIAGNOSIS — N6320 Unspecified lump in the left breast, unspecified quadrant: Secondary | ICD-10-CM

## 2020-10-10 IMAGING — US US BREAST*L* LIMITED INC AXILLA
1 series · 13 of 13 positions shown · non-contrast
Comparison: None.

CLINICAL DATA: 40-year-old female presenting with new focal
thickening and pain in the outer left breast.

EXAM:
DIGITAL DIAGNOSTIC BILATERAL MAMMOGRAM WITH TOMOSYNTHESIS AND CAD;
ULTRASOUND LEFT BREAST LIMITED
TECHNIQUE: Bilateral digital diagnostic mammography and breast tomosynthesis
was performed. The images were evaluated with computer-aided
detection.; Targeted ultrasound examination of the left breast was
performed.

[Series 1: us breast*left* limited inc axilla · 0.07mm/px · 13 of 13 slices shown]
[im 1/13]
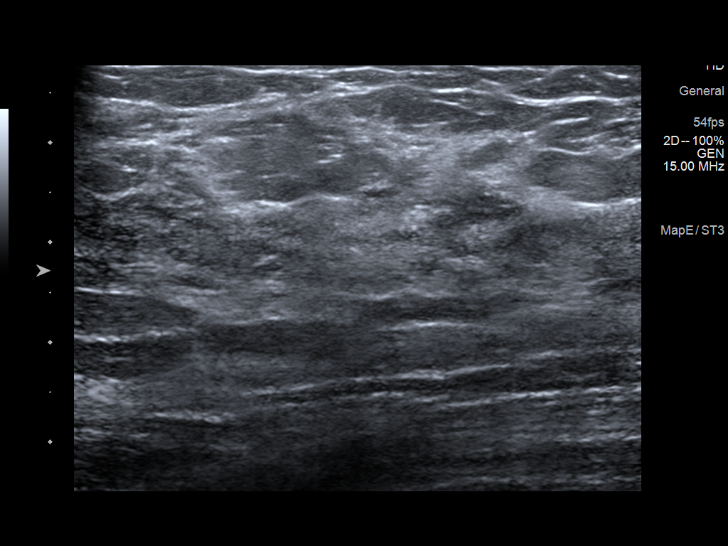
[im 2/13]
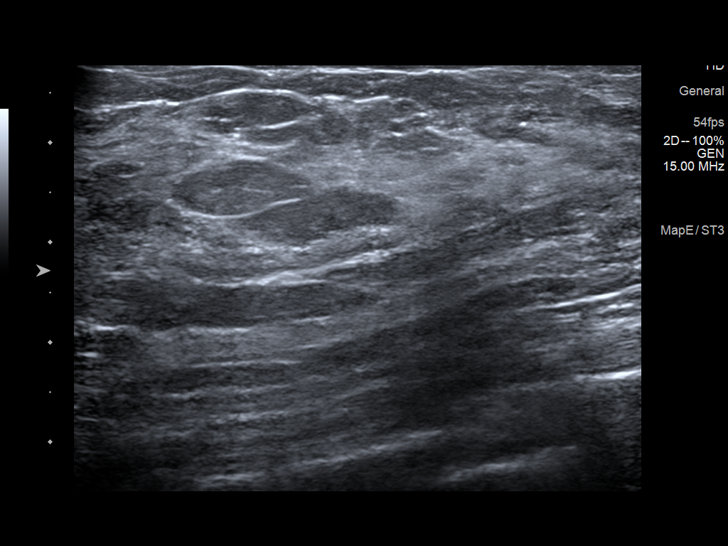
[im 3/13]
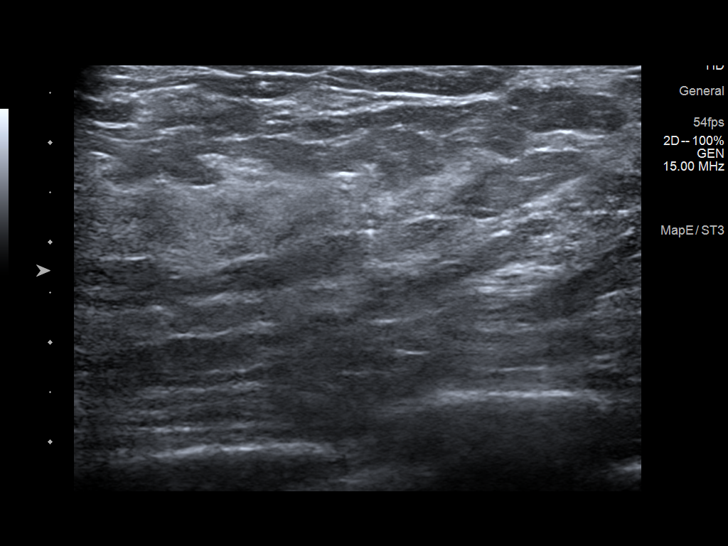
[im 4/13]
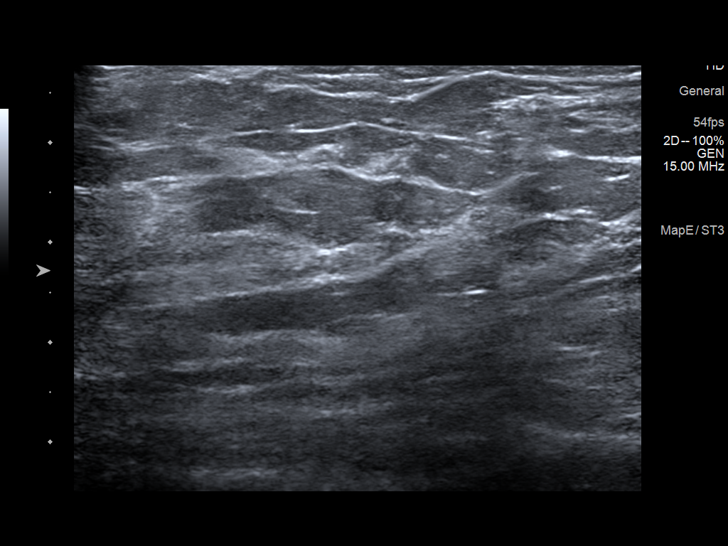
[im 5/13]
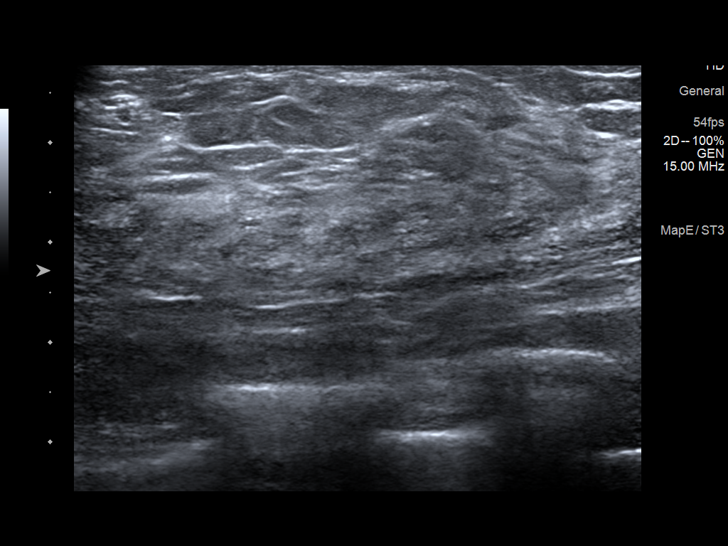
[im 6/13]
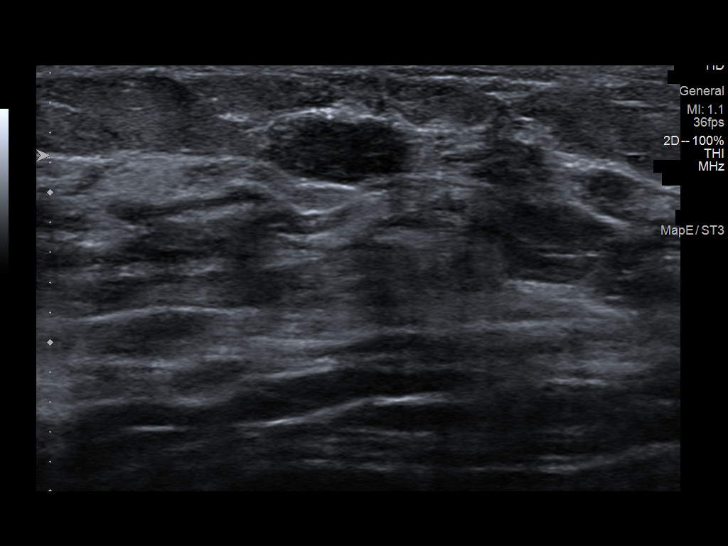
[im 7/13]
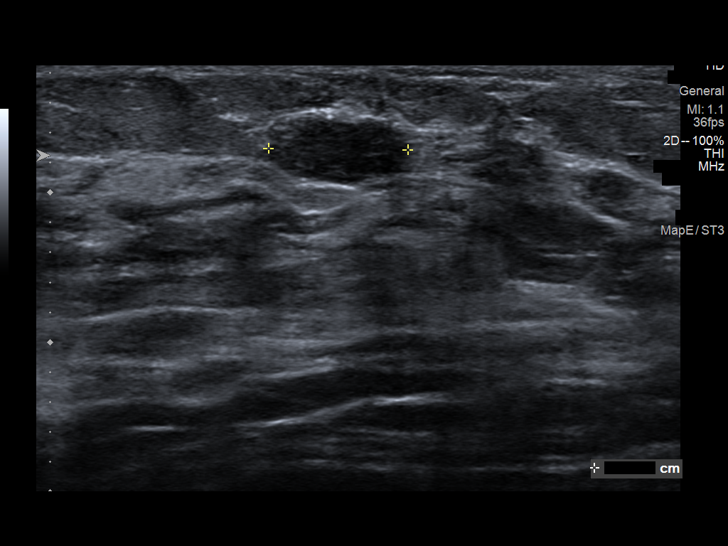
[im 8/13]
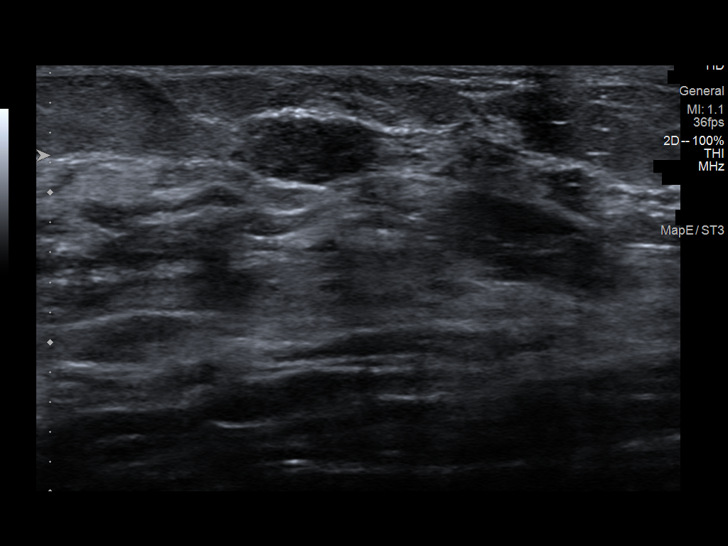
[im 9/13]
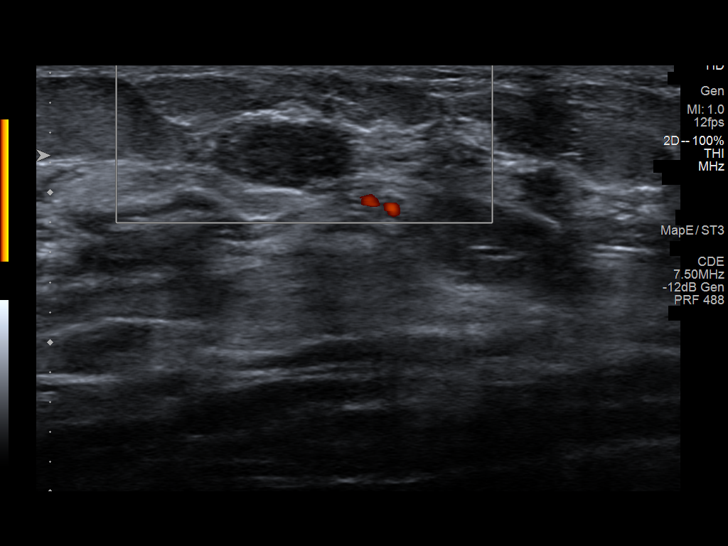
[im 10/13]
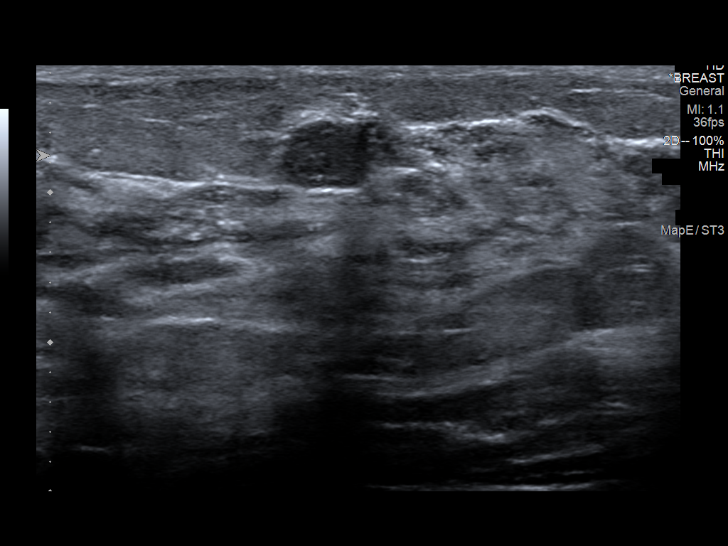
[im 11/13]
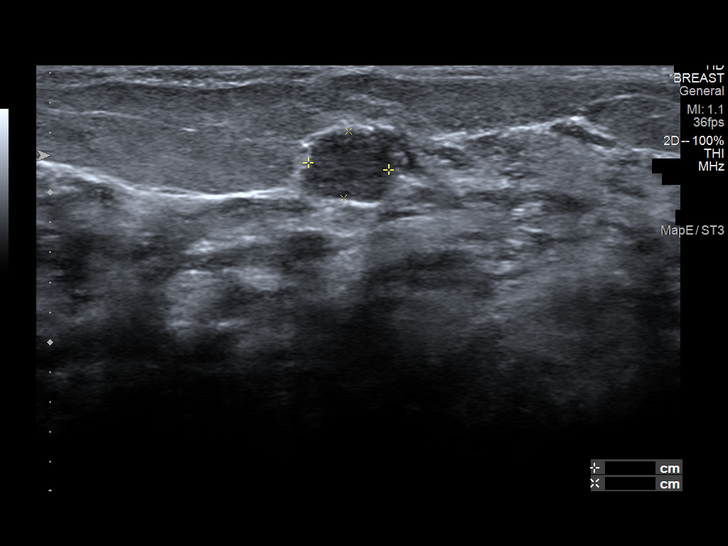
[im 12/13]
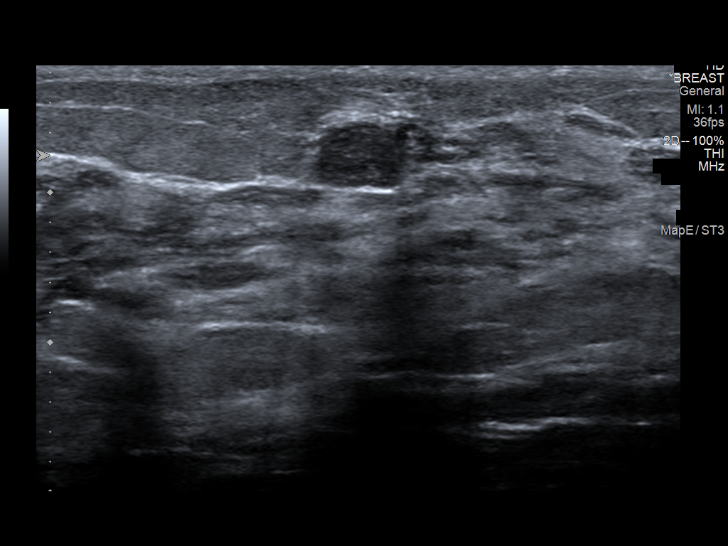
[im 13/13]
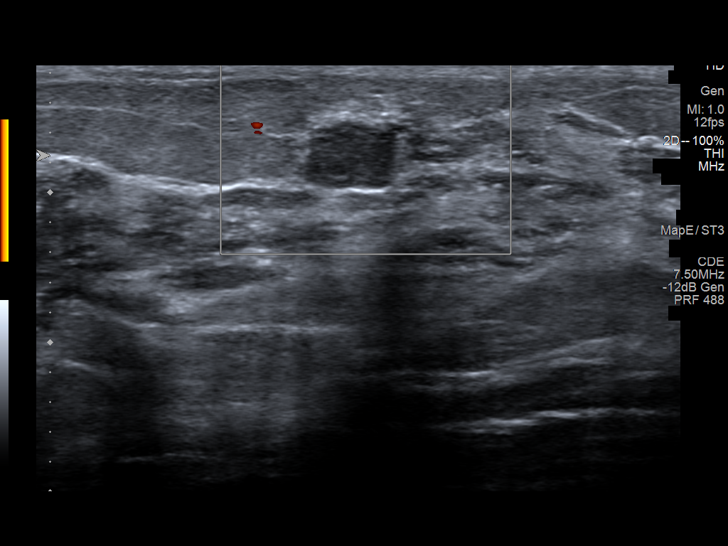

[13 of 13 positions shown; findings below may reference images not displayed]

ACR Breast Density Category d: The breast tissue is extremely dense,
which lowers the sensitivity of mammography.
FINDINGS: Mammogram:

Right breast: No suspicious mass, distortion, or microcalcifications
are identified to suggest presence of malignancy.

Left breast: There is dense tissue throughout the outer left breast
without a definite discrete mass or distortion. No suspicious
microcalcifications. There is no evidence of skin thickening.

On physical exam of the left breast at the site of concern reported
by the patient I feel regional thickening in the upper outer aspect
without a fixed mass. There are no obvious skin changes.

Ultrasound:

Targeted ultrasound is performed throughout the upper-outer quadrant
of the left breast demonstrating a likely incidental hypoechoic mass
with indistinct margins at 3 o'clock 4 cm from the nipple measuring
0.9 x 0.5 x 0.5 cm. No internal vascularity. There is no additional
suspicious cystic or solid mass.

Targeted ultrasound the left axilla demonstrates normal lymph nodes.
IMPRESSION: 1. Indeterminate mass in the left breast at 3 o'clock, possibly a
fibroadenoma.

2. Palpable area of thickening in the upper outer left breast
without a mammographic or sonographic correlate.

RECOMMENDATION:
1. Ultrasound-guided core needle biopsy of the left breast mass at 3
o'clock.

2. Following biopsy of the left breast mass at 3 o'clock recommend
diagnostic breast MRI for evaluation of the new left palpable
thickening.

I have discussed the findings and recommendations with the patient.
If applicable, a reminder letter will be sent to the patient
regarding the next appointment.

BI-RADS CATEGORY  4: Suspicious.

## 2020-10-10 IMAGING — MG DIGITAL DIAGNOSTIC BILAT W/ TOMO W/ CAD
8 series · 8 of 24 positions shown · non-contrast
Comparison: None.

CLINICAL DATA: 40-year-old female presenting with new focal
thickening and pain in the outer left breast.

EXAM:
DIGITAL DIAGNOSTIC BILATERAL MAMMOGRAM WITH TOMOSYNTHESIS AND CAD;
ULTRASOUND LEFT BREAST LIMITED
TECHNIQUE: Bilateral digital diagnostic mammography and breast tomosynthesis
was performed. The images were evaluated with computer-aided
detection.; Targeted ultrasound examination of the left breast was
performed.

[R CC synth-2D]
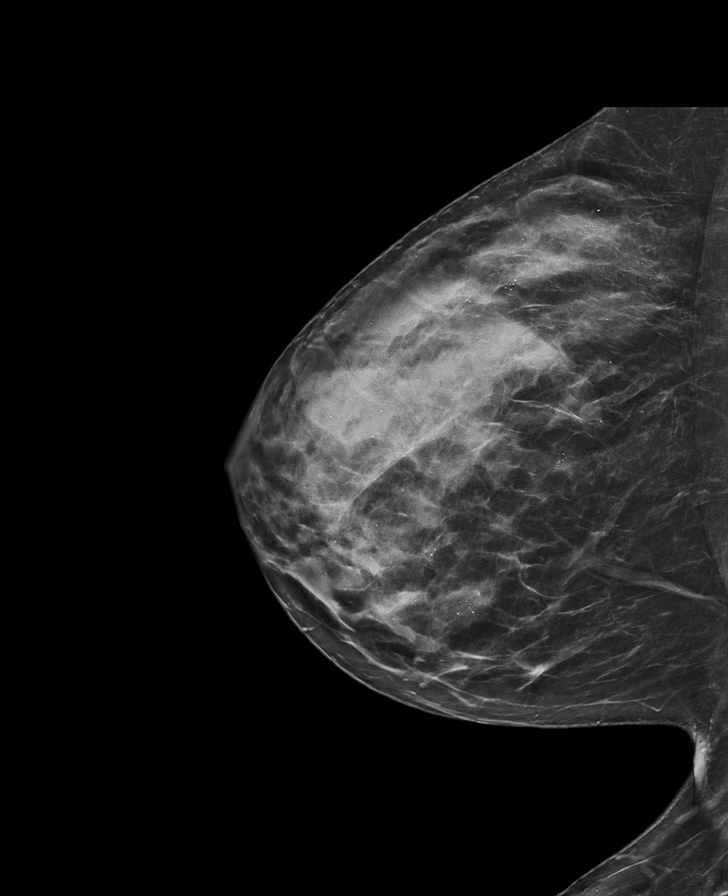

[L MLO synth-2D]
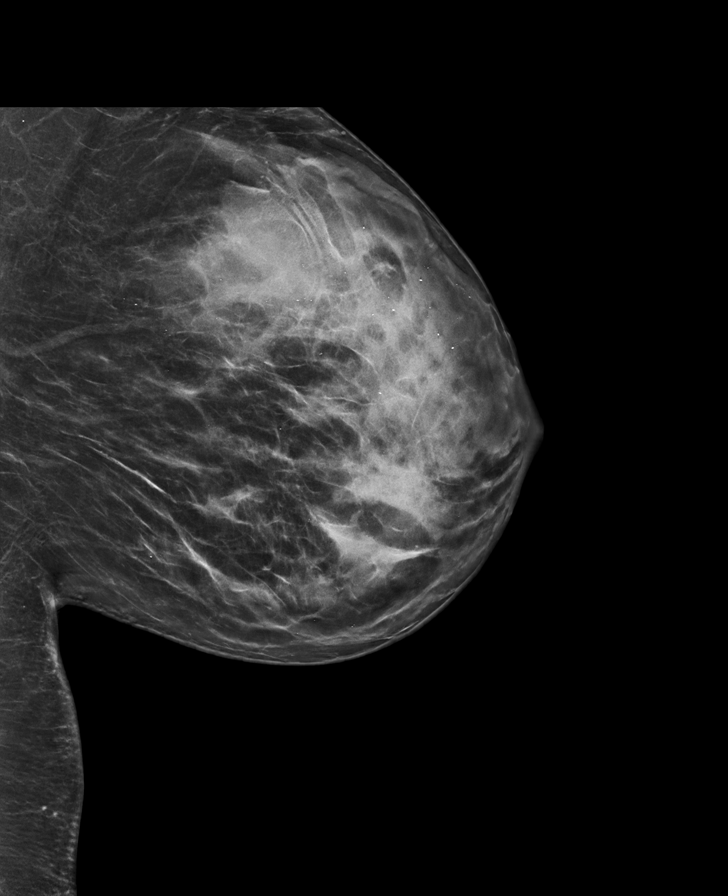

[L CC synth-2D]
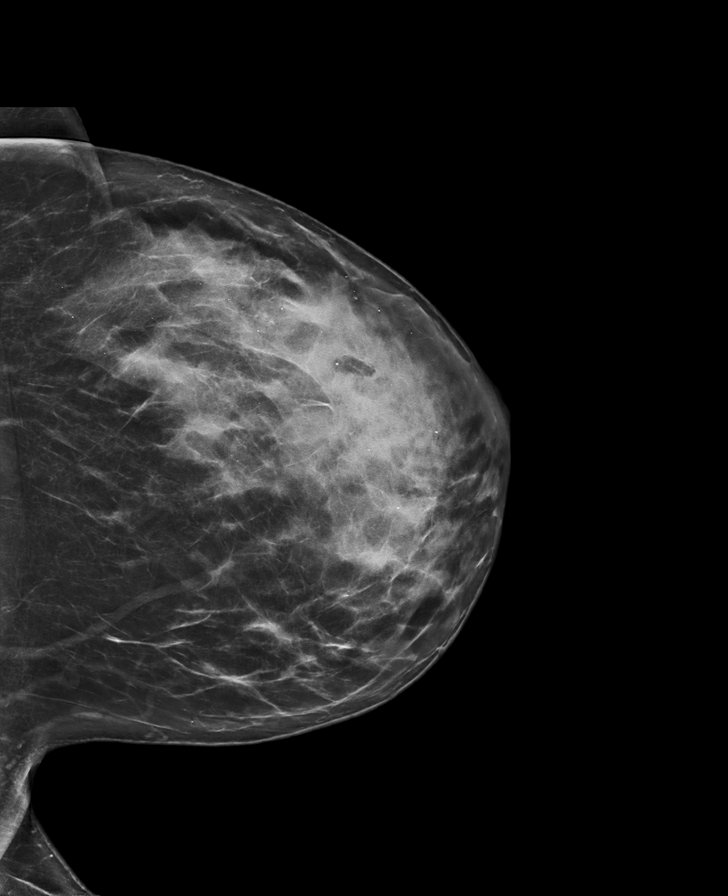

[R MLO synth-2D]
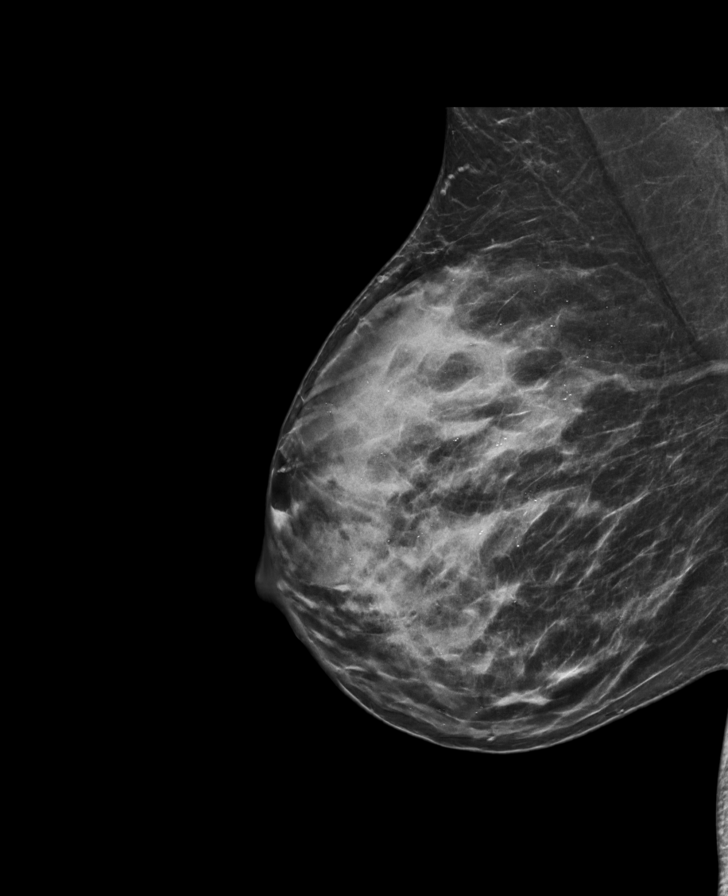

[R MLO tomo · tomo slice 37/73.0]
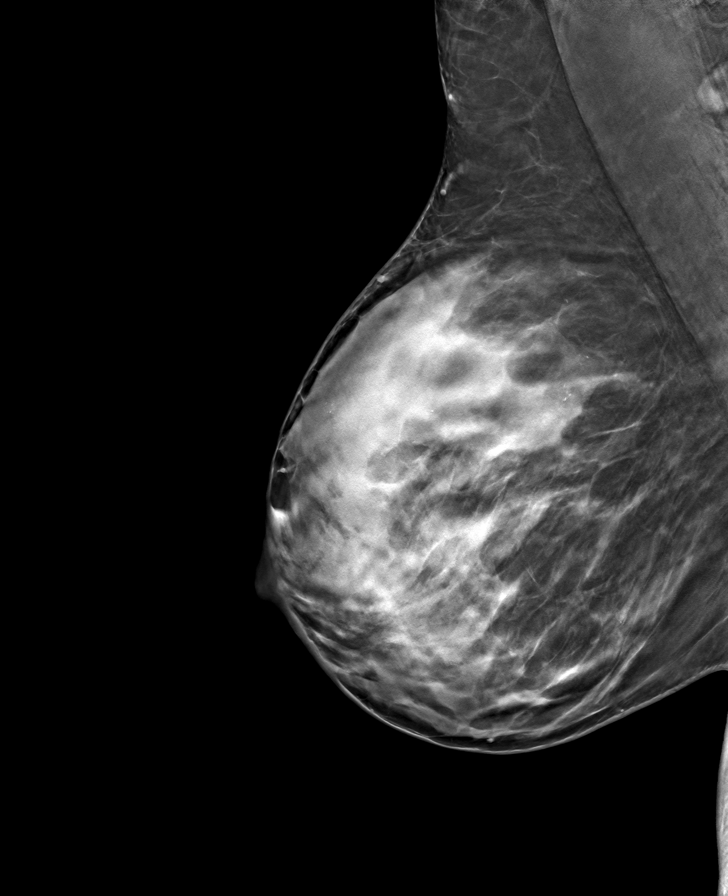

[L CC tomo · tomo slice 40/79.0]
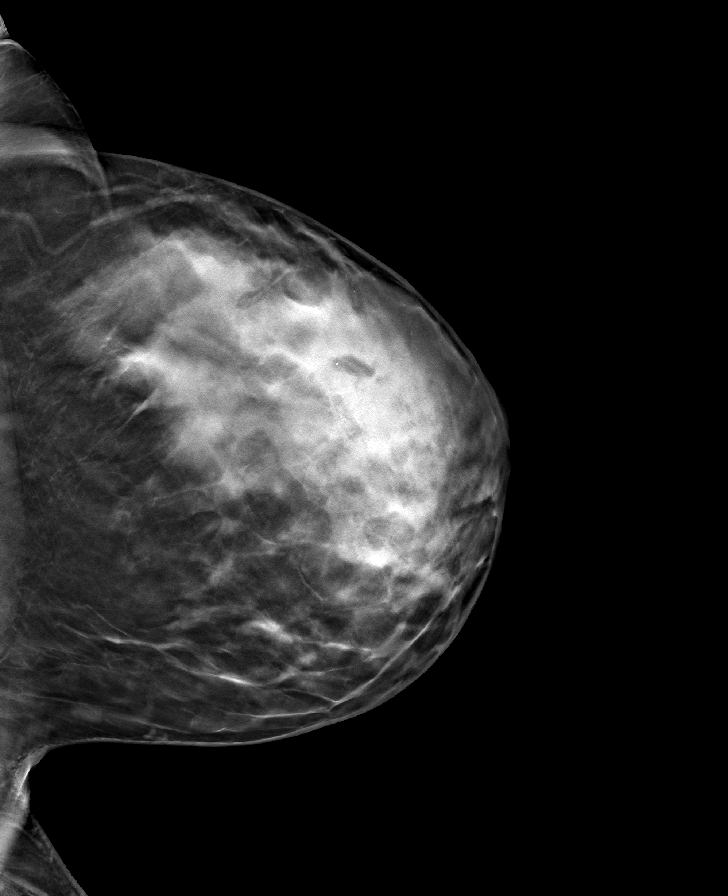

[R CC tomo · tomo slice 35/70.0]
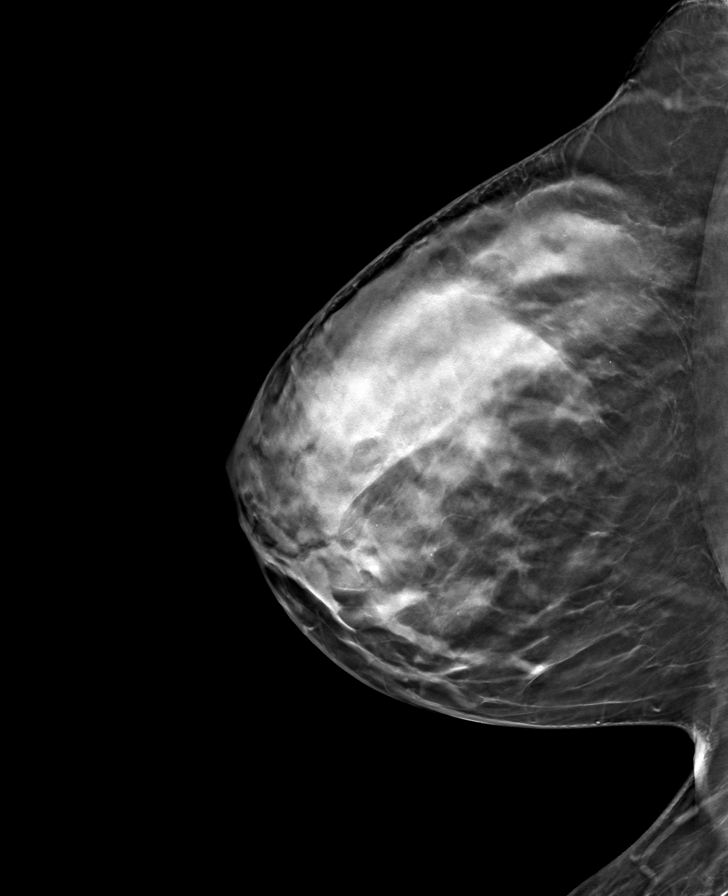

[L MLO tomo · tomo slice 40/79.0]
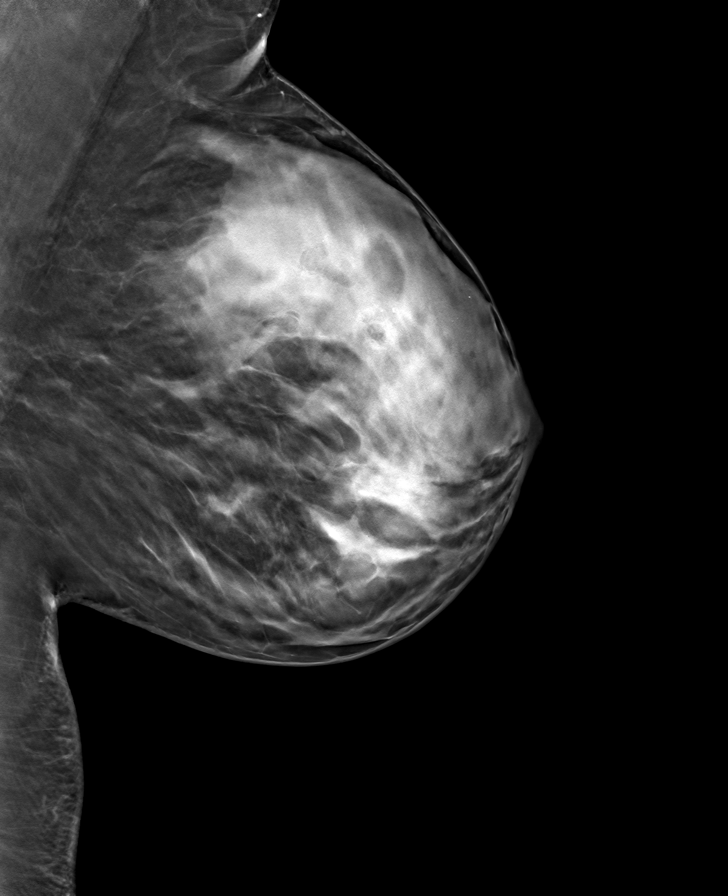

[8 of 24 positions shown; findings below may reference images not displayed]

ACR Breast Density Category d: The breast tissue is extremely dense,
which lowers the sensitivity of mammography.
FINDINGS: Mammogram:

Right breast: No suspicious mass, distortion, or microcalcifications
are identified to suggest presence of malignancy.

Left breast: There is dense tissue throughout the outer left breast
without a definite discrete mass or distortion. No suspicious
microcalcifications. There is no evidence of skin thickening.

On physical exam of the left breast at the site of concern reported
by the patient I feel regional thickening in the upper outer aspect
without a fixed mass. There are no obvious skin changes.

Ultrasound:

Targeted ultrasound is performed throughout the upper-outer quadrant
of the left breast demonstrating a likely incidental hypoechoic mass
with indistinct margins at 3 o'clock 4 cm from the nipple measuring
0.9 x 0.5 x 0.5 cm. No internal vascularity. There is no additional
suspicious cystic or solid mass.

Targeted ultrasound the left axilla demonstrates normal lymph nodes.
IMPRESSION: 1. Indeterminate mass in the left breast at 3 o'clock, possibly a
fibroadenoma.

2. Palpable area of thickening in the upper outer left breast
without a mammographic or sonographic correlate.

RECOMMENDATION:
1. Ultrasound-guided core needle biopsy of the left breast mass at 3
o'clock.

2. Following biopsy of the left breast mass at 3 o'clock recommend
diagnostic breast MRI for evaluation of the new left palpable
thickening.

I have discussed the findings and recommendations with the patient.
If applicable, a reminder letter will be sent to the patient
regarding the next appointment.

BI-RADS CATEGORY  4: Suspicious.

## 2020-10-22 ENCOUNTER — Other Ambulatory Visit: Payer: Managed Care, Other (non HMO)

## 2020-10-23 ENCOUNTER — Ambulatory Visit
Admission: RE | Admit: 2020-10-23 | Discharge: 2020-10-23 | Disposition: A | Discharge status: Home / Self Care | Payer: Managed Care, Other (non HMO) | Source: Ambulatory Visit | Attending: Nurse Practitioner | Admitting: Nurse Practitioner

## 2020-10-23 ENCOUNTER — Other Ambulatory Visit: Payer: Self-pay

## 2020-10-23 DIAGNOSIS — N6320 Unspecified lump in the left breast, unspecified quadrant: Secondary | ICD-10-CM

## 2020-10-23 IMAGING — MG MM BREAST LOCALIZATION CLIP
4 series · 4 of 12 positions shown · non-contrast
Comparison: Previous exam(s).

CLINICAL DATA: Status post ultrasound-guided core biopsy of LEFT
breast.

EXAM:
3D DIAGNOSTIC LEFT MAMMOGRAM POST ULTRASOUND BIOPSY

[L CC synth-2D]
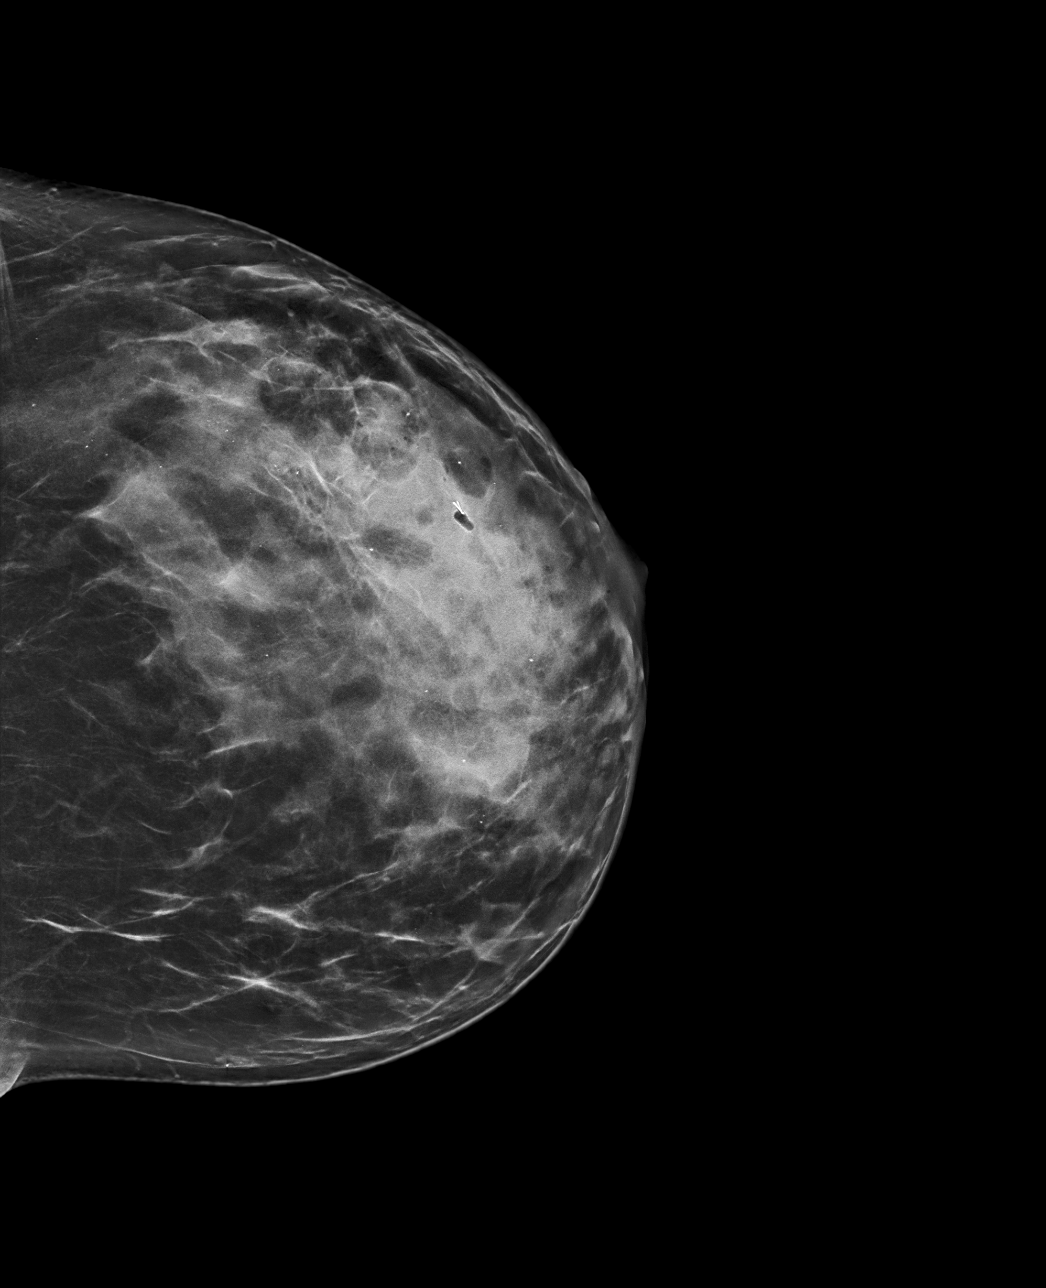

[L ML synth-2D]
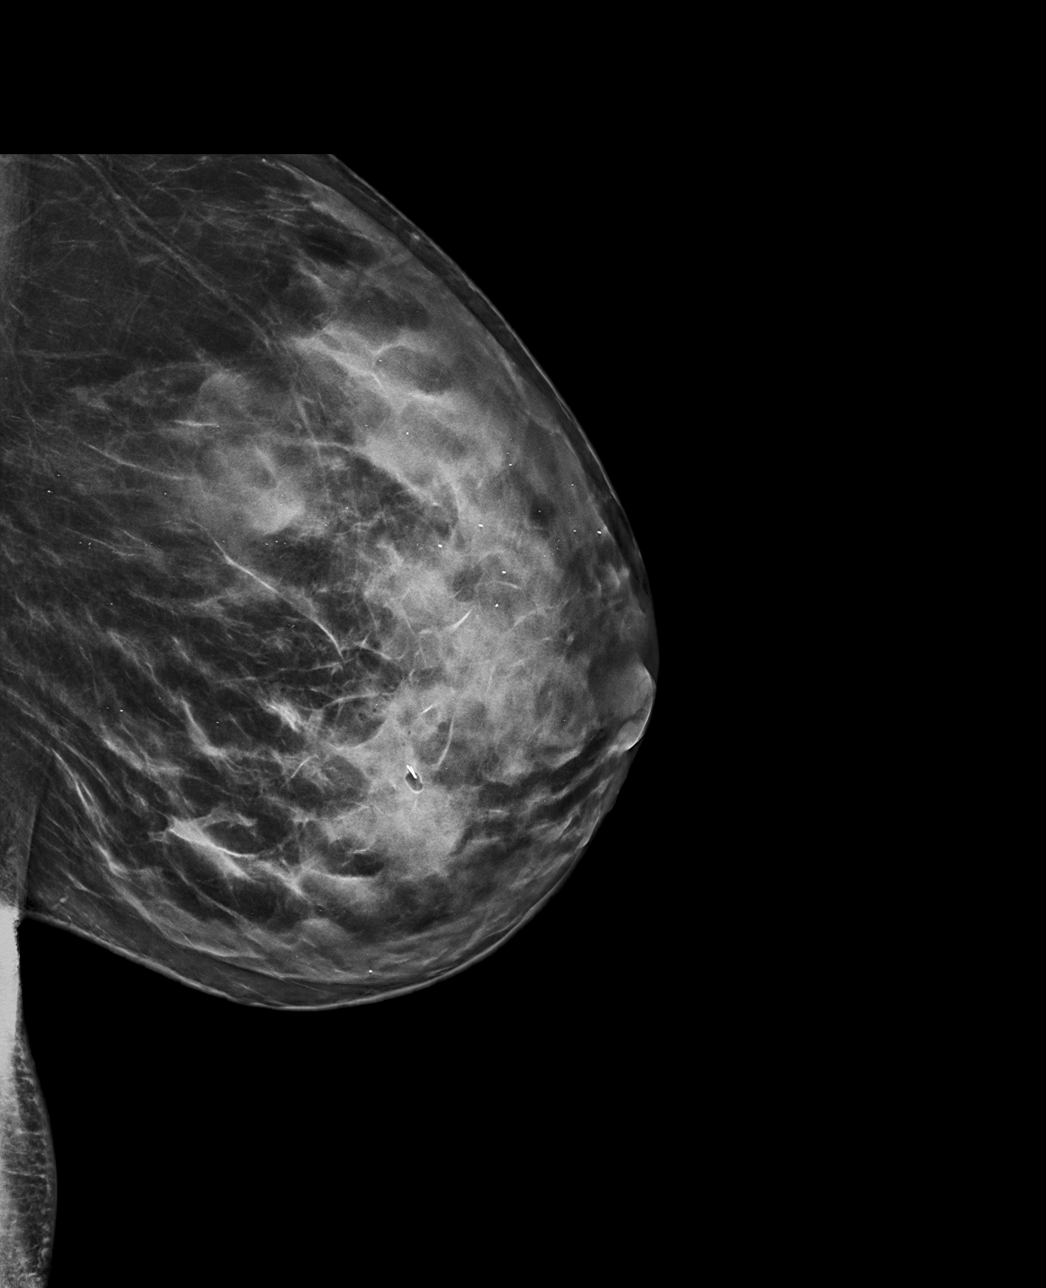

[L CC tomo · tomo slice 39/76.0]
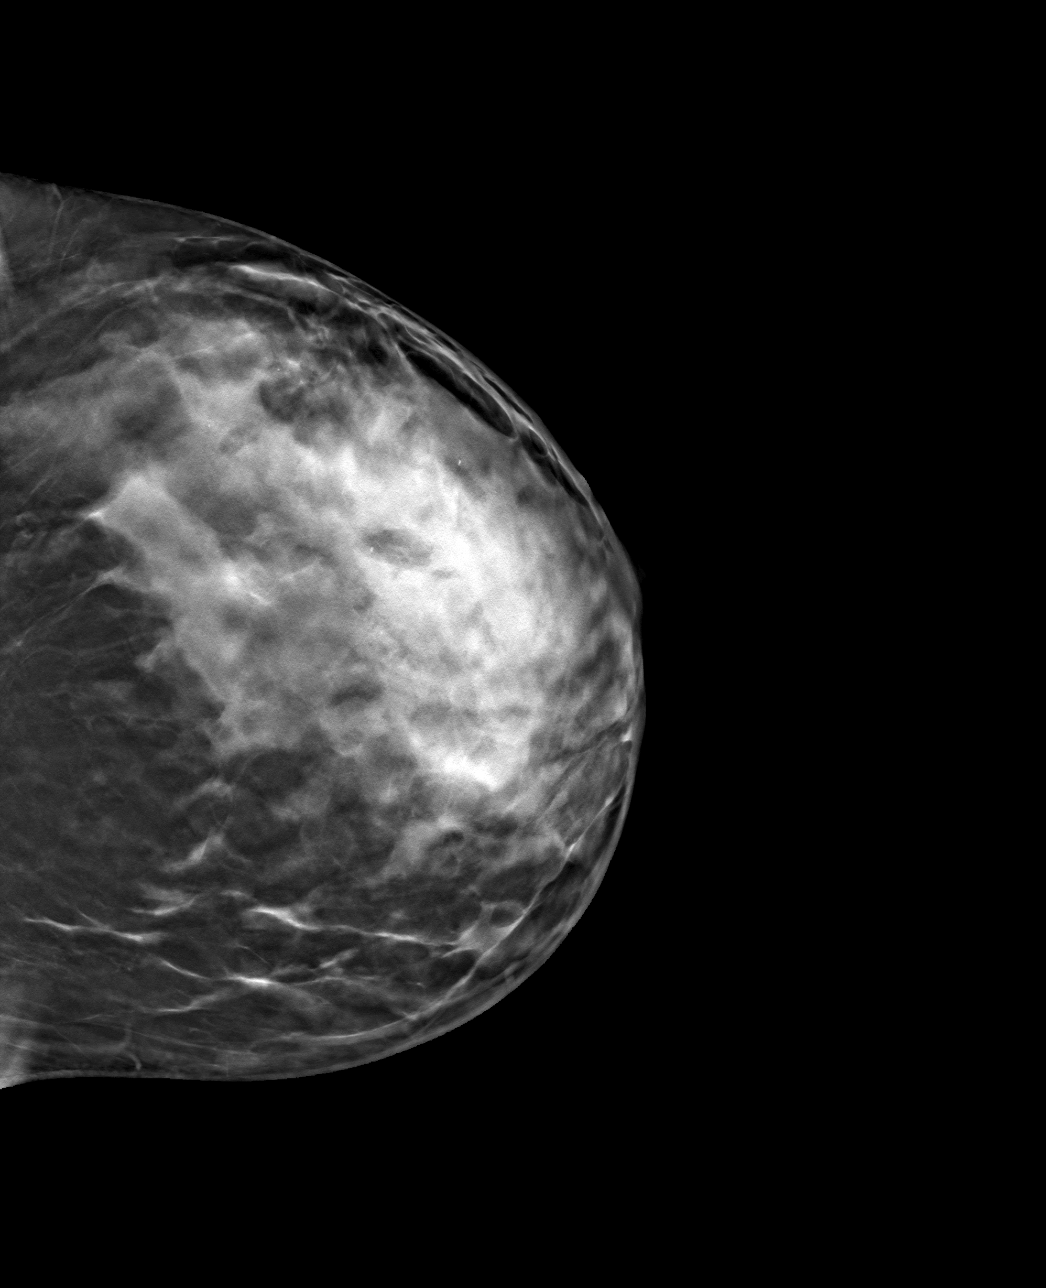

[L ML tomo · tomo slice 39/78.0]
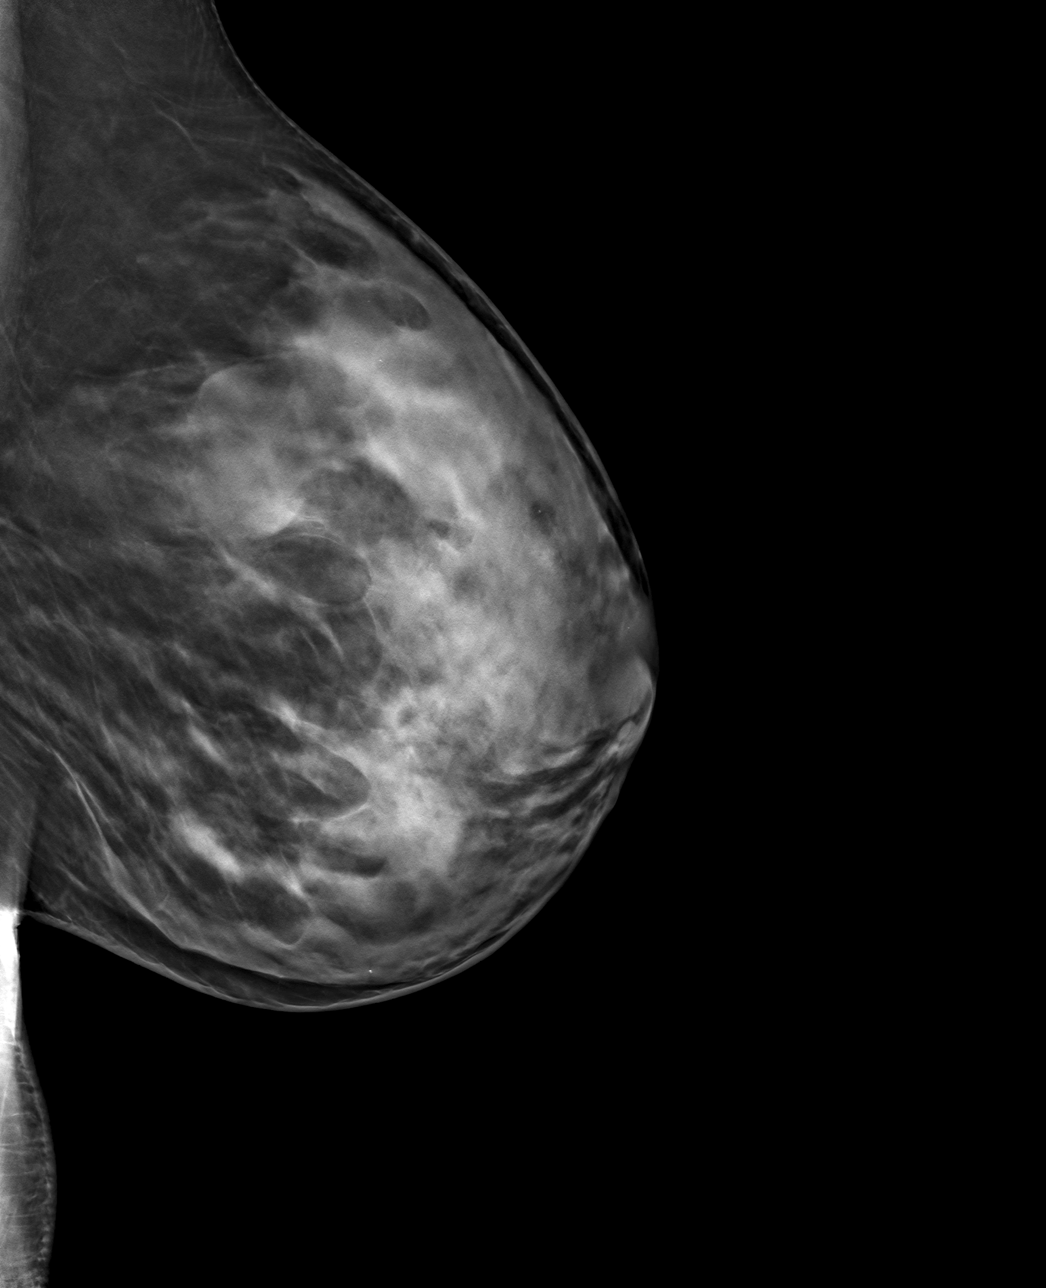

[4 of 12 positions shown; findings below may reference images not displayed]

FINDINGS: 3D Mammographic images were obtained following ultrasound guided
biopsy of mass in the 3 o'clock location of the LEFT breast and
placement of a ribbon shaped clip. The biopsy marking clip is in
expected position at the site of biopsy.
IMPRESSION: Appropriate positioning of the ribbon shaped biopsy marking clip at
the site of biopsy in the LATERAL LEFT breast.

Final Assessment: Post Procedure Mammograms for Marker Placement

## 2020-10-23 IMAGING — US US BREAST BX W LOC DEV 1ST LESION IMG BX SPEC US GUIDE*L*
1 series · 12 of 14 positions shown · non-contrast
Comparison: Previous exam(s).
COMPARISON: Previous exam(s).

Addendum:
CLINICAL DATA: Patient presents for ultrasound-guided core biopsy
of the LEFT breast.

EXAM:
ULTRASOUND GUIDED LEFT BREAST CORE NEEDLE BIOPSY

[Series 1: us breast bx w loc dev 1st lesion img bx spec us g · 0.06mm/px · 12 of 14 slices shown]
[im 1/14]
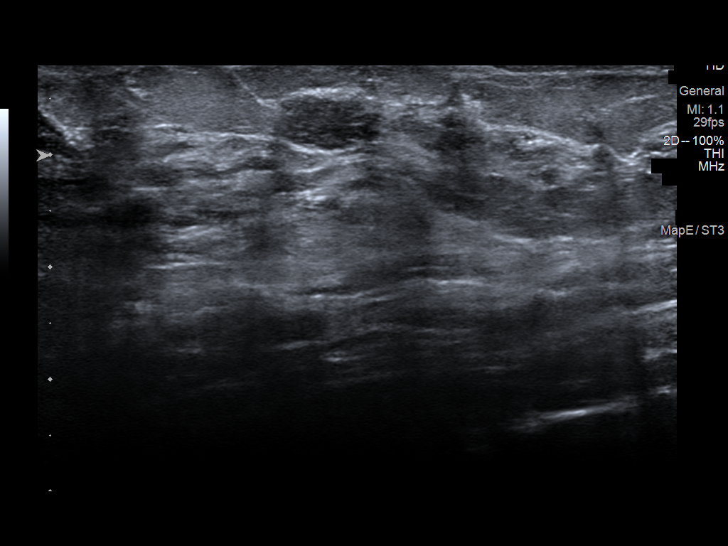
[im 2/14]
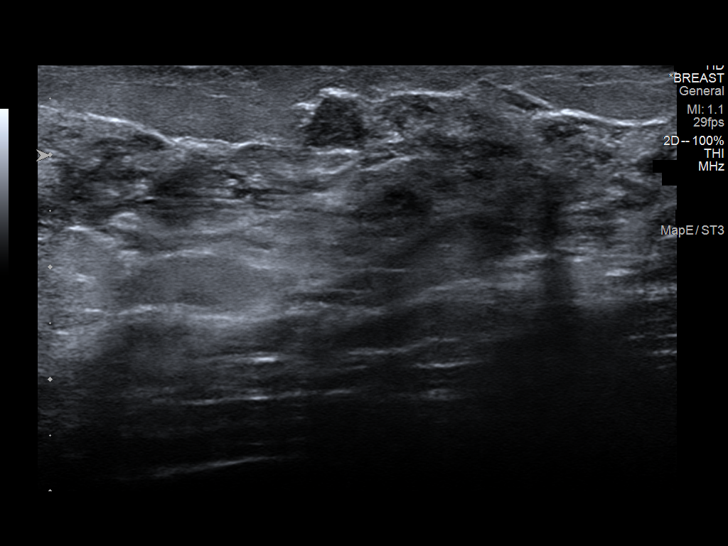
[im 3/14]
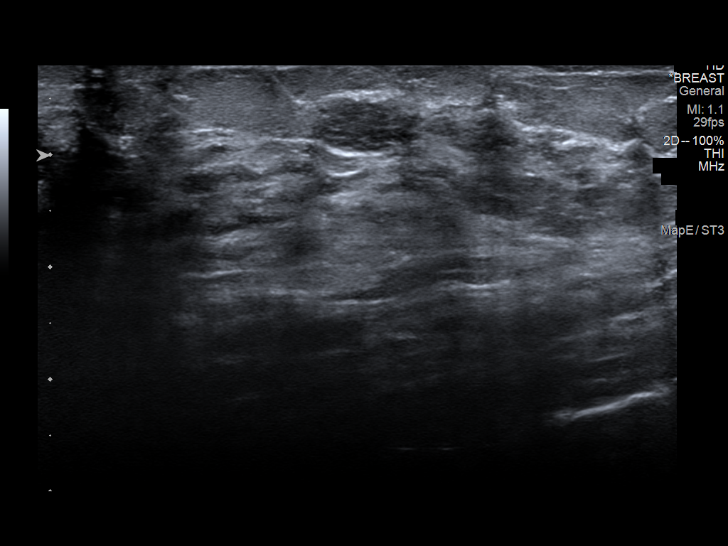
[im 5/14]
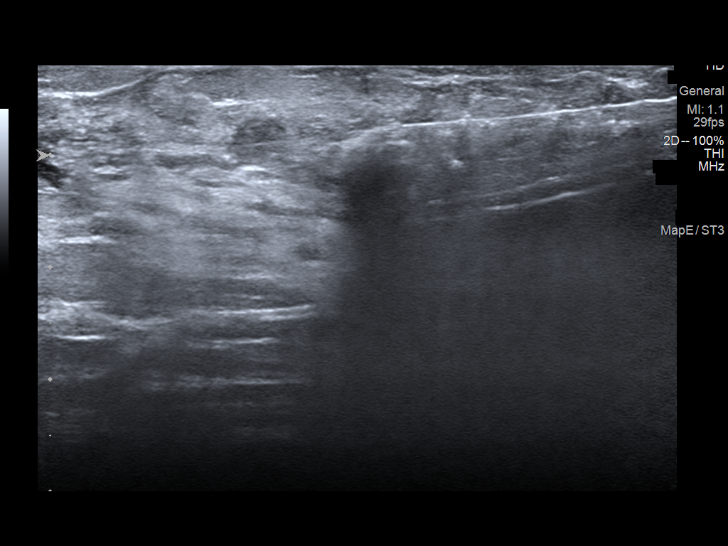
[im 6/14]
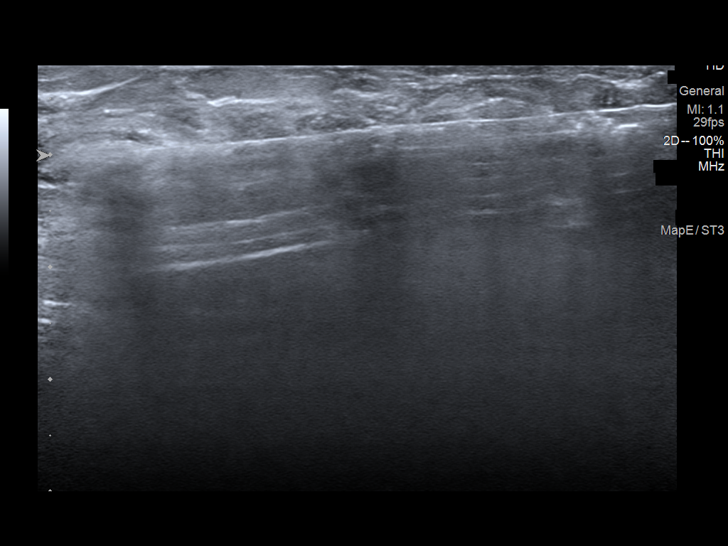
[im 7/14]
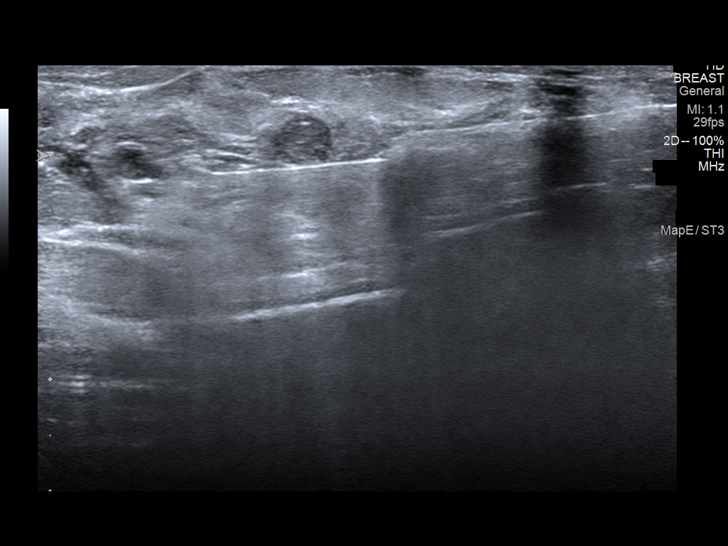
[im 8/14]
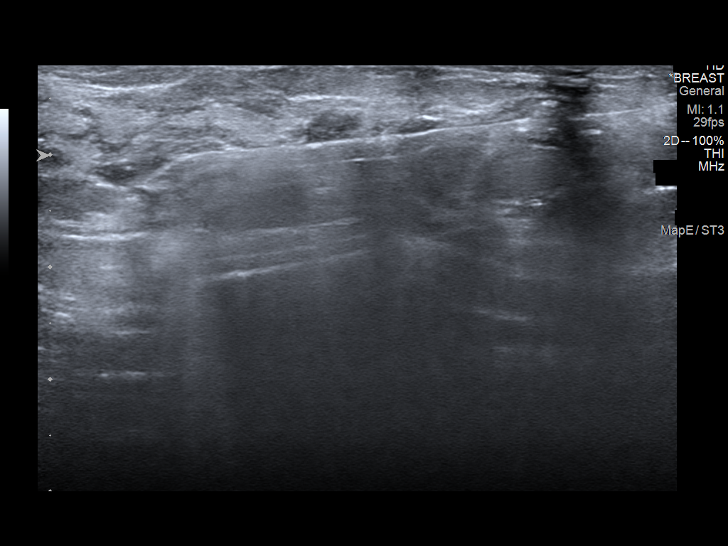
[im 9/14]
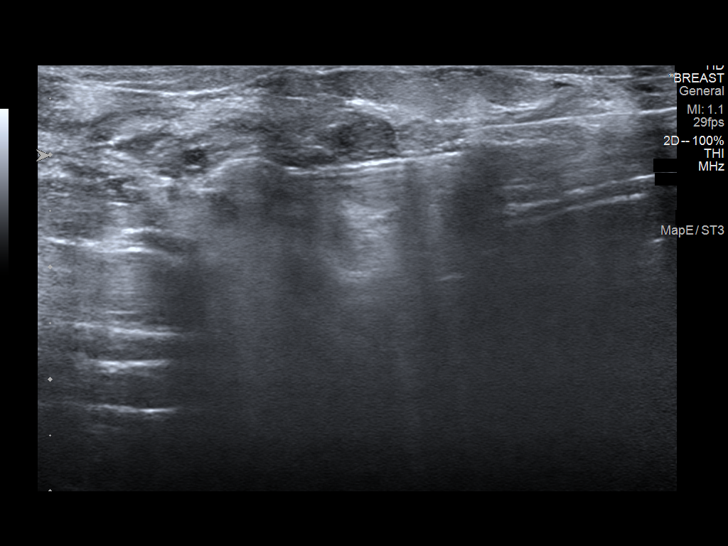
[im 10/14]
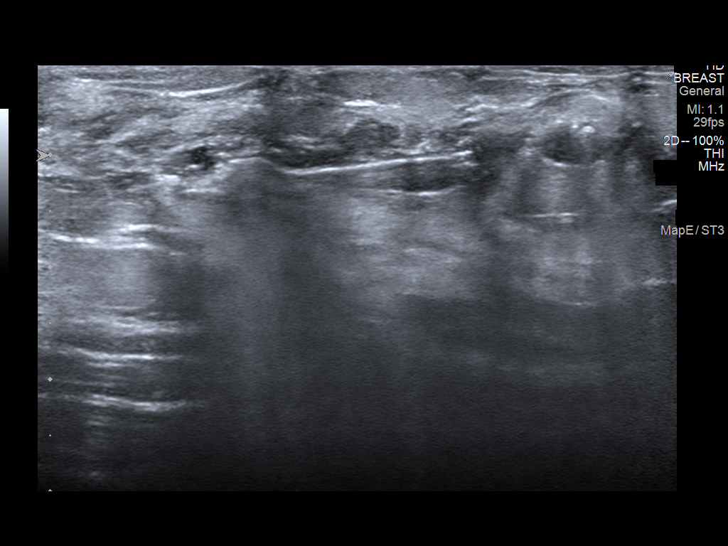
[im 12/14]
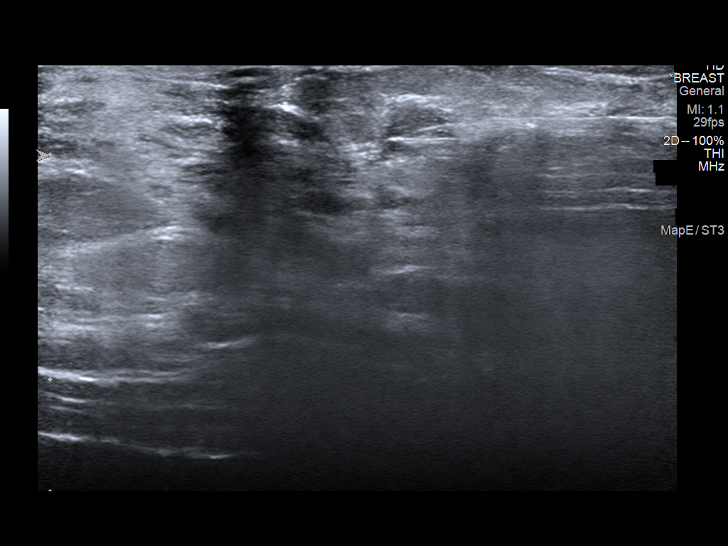
[im 13/14]
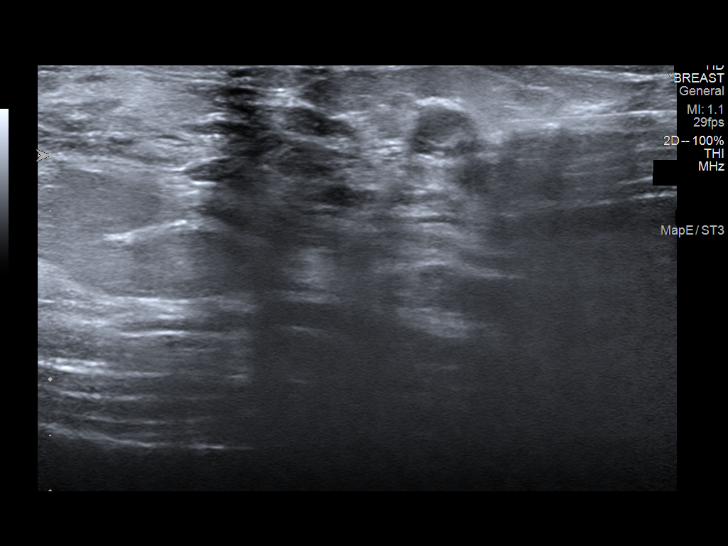
[im 14/14]
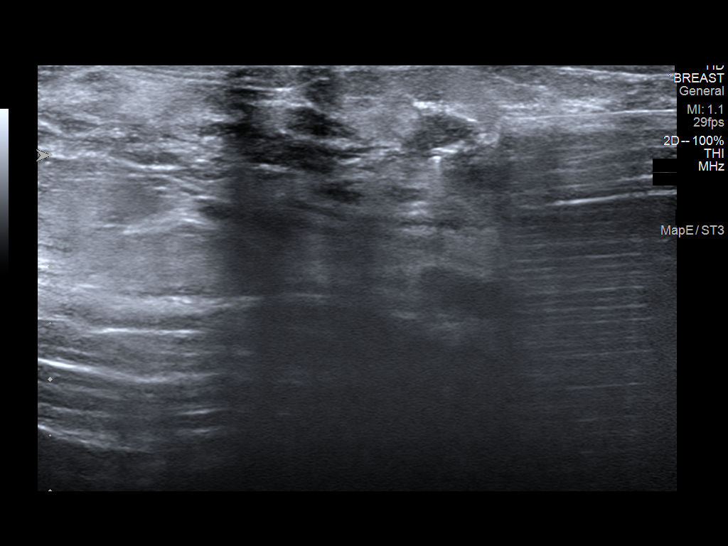

[12 of 14 positions shown; findings below may reference images not displayed]



Lesion quadrant: 3 o'clock LEFT breast

Using sterile technique and 1% Lidocaine as local anesthetic, under
direct ultrasound visualization, a 12 gauge KETELE device was
used to perform biopsy of mass in the 3 o'clock location of the LEFT
breast 4 centimeters from the nipple using a LATERAL to MEDIAL
approach. At the conclusion of the procedure ribbon shaped tissue
marker clip was deployed into the biopsy cavity. Follow up 2 view
mammogram was performed and dictated separately.
IMPRESSION: Ultrasound guided biopsy of LEFT breast mass. No apparent
complications.

ADDENDUM:
Pathology revealed GRADE I-II INVASIVE DUCTAL CARCINOMA WITH
EXTRACELLULAR MUCIN, DUCTAL CARCINOMA IN SITU of the LEFT breast, 3
o'clock, [MN], (ribbon clip). This was found to be concordant by
Dr. KETELE.

Pathology results were discussed with the patient by telephone. The
patient reported doing well after the biopsy with tenderness at the
site. Post biopsy instructions and care were reviewed and questions
were answered. The patient was encouraged to call The [REDACTED] for any additional concerns. My direct phone
number was provided.

The patient was referred to [REDACTED]
[REDACTED] at [REDACTED] on
[DATE].

Recommendation for a bilateral breast MRI given the lesion is not
seen mammographically. Patient has category D-extremely dense breast
tissue and young age to exclude any additional sites of disease.

Pathology results reported by KETELE, RN on [DATE].



Lesion quadrant: 3 o'clock LEFT breast

Using sterile technique and 1% Lidocaine as local anesthetic, under
direct ultrasound visualization, a 12 gauge KETELE device was
used to perform biopsy of mass in the 3 o'clock location of the LEFT
breast 4 centimeters from the nipple using a LATERAL to MEDIAL
approach. At the conclusion of the procedure ribbon shaped tissue
marker clip was deployed into the biopsy cavity. Follow up 2 view
mammogram was performed and dictated separately.
IMPRESSION: Ultrasound guided biopsy of LEFT breast mass. No apparent
complications.

## 2020-10-24 ENCOUNTER — Telehealth: Payer: Self-pay | Admitting: Hematology and Oncology

## 2020-10-24 ENCOUNTER — Telehealth: Payer: Self-pay

## 2020-10-24 DIAGNOSIS — C50912 Malignant neoplasm of unspecified site of left female breast: Secondary | ICD-10-CM

## 2020-10-24 NOTE — Telephone Encounter (Signed)
I called patient and left message that MRI breast order placed. She works at [employer]. Advised her to contact them to schedule appointment and phone number provided. I asked her once scheduled to call back and let us know when it is scheduled so we can let Hayley know to check on insurance.

## 2020-10-24 NOTE — Telephone Encounter (Signed)
LVM for patient in reference to [facility] appointment for 10/12, packet will be sent when call is returned

## 2020-10-24 NOTE — Telephone Encounter (Signed)
MRI breast bilateral is scheduled Friday, 10/25/20 at [place]. Routed high priority to [facility] to check on insurance.

## 2020-10-24 NOTE — Telephone Encounter (Signed)
Gabriela Gammon, NP  P [facility] Triage  Please order breast MRI for invasive ductal carcinoma of left breast. Please schedule at [place] as this is where patient works. Thank you.

## 2020-10-25 ENCOUNTER — Encounter: Payer: Self-pay | Admitting: *Deleted

## 2020-10-25 ENCOUNTER — Telehealth: Payer: Self-pay | Admitting: Hematology and Oncology

## 2020-10-25 ENCOUNTER — Other Ambulatory Visit: Payer: Self-pay | Admitting: Nurse Practitioner

## 2020-10-25 ENCOUNTER — Ambulatory Visit
Admission: RE | Admit: 2020-10-25 | Discharge: 2020-10-25 | Disposition: A | Discharge status: Home / Self Care | Payer: Managed Care, Other (non HMO) | Source: Ambulatory Visit | Attending: Nurse Practitioner | Admitting: Nurse Practitioner

## 2020-10-25 DIAGNOSIS — C50912 Malignant neoplasm of unspecified site of left female breast: Secondary | ICD-10-CM

## 2020-10-25 DIAGNOSIS — R599 Enlarged lymph nodes, unspecified: Secondary | ICD-10-CM

## 2020-10-25 IMAGING — MR MR BREAST BILAT WO/W CM
8 of 12 series · 32 of 48 positions shown · IV contrast (10 gadavist)
Comparison: No prior MRI available for comparison. Correlation made
with prior mammogram and ultrasound images.

CLINICAL DATA: 40-year-old female with recent diagnosis of grade
1-2 invasive ductal carcinoma with extracellular mucin and DCIS in
the left breast (ribbon clip). She presents for breast MRI to
determine extent of disease. Patient has no known family history of
breast cancer.

LABS:  Creatinine of 0.72 mg/dL on [DATE]
EXAM:
BILATERAL BREAST MRI WITH AND WITHOUT CONTRAST
TECHNIQUE: Multiplanar, multisequence MR images of both breasts were obtained
prior to and following the intravenous administration of 10 ml of
Gadavist

[Series 2: t2_tirm_tra ipat (a-p) · axial · 3.0mm · 0.70mm/px · 1 of 65 slices shown]
[im 1/65]
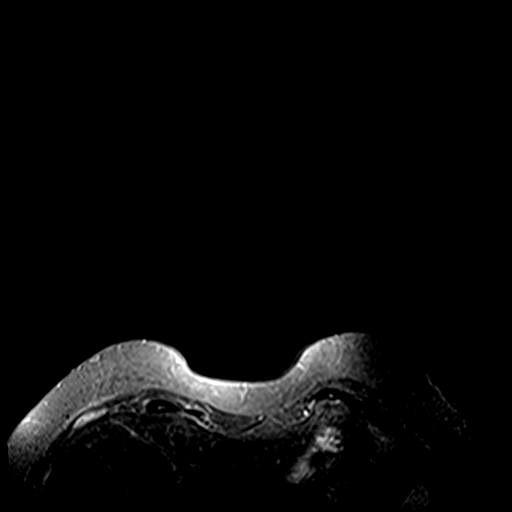

[Series 3: fl3d pre-cm non · axial · non-contrast · 1.2mm · 0.94mm/px · z∈[-69,+141]mm · 5 of 176 slices shown]
[im 1/176]
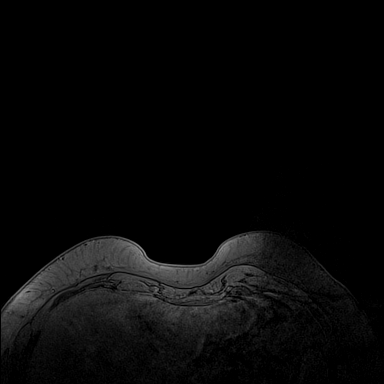
[im 44/176]
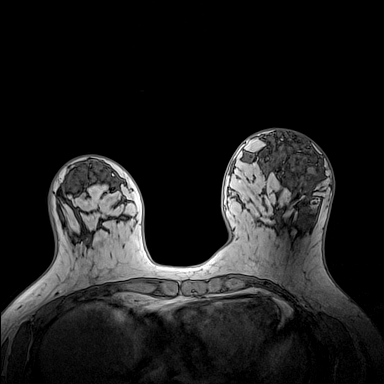
[im 88/176]
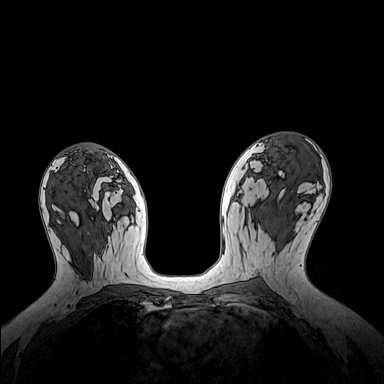
[im 132/176]
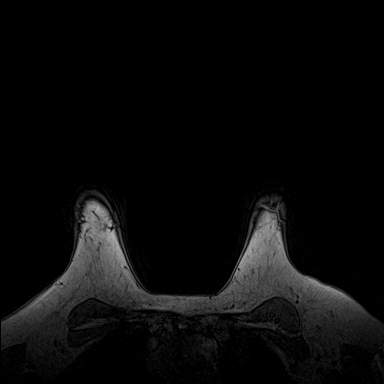
[im 176/176]
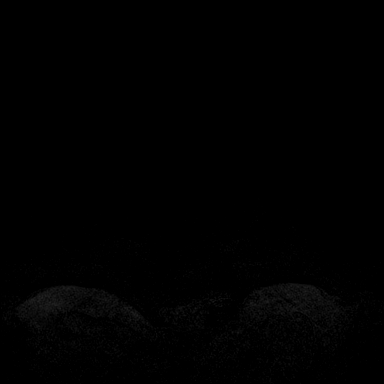

[Series 4: fl3d pre-cm · axial · non-contrast · 1.2mm · 0.94mm/px · z∈[-69,+141]mm · 5 of 176 slices shown]
[im 1/176]
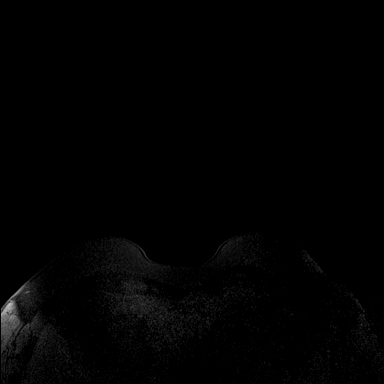
[im 44/176]
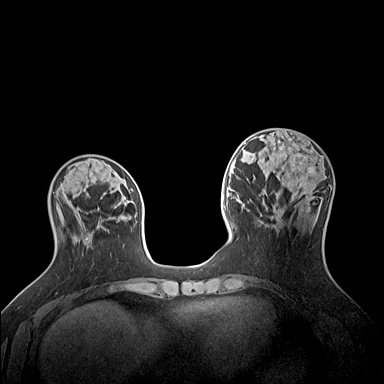
[im 88/176]
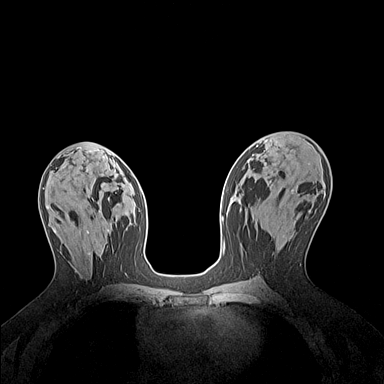
[im 132/176]
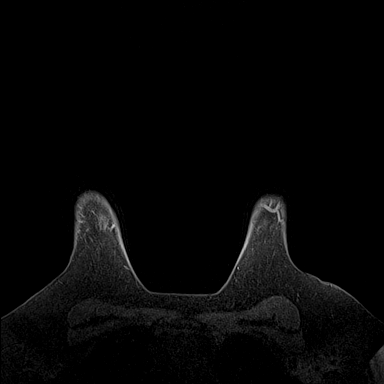
[im 176/176]
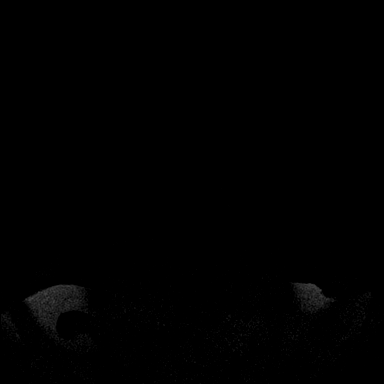

[Series 5: fl3d pre-cm 20 · axial · non-contrast · 1.2mm · 0.94mm/px · z∈[-69,+141]mm · 5 of 176 slices shown (1 of 3)]
[im 1/176]
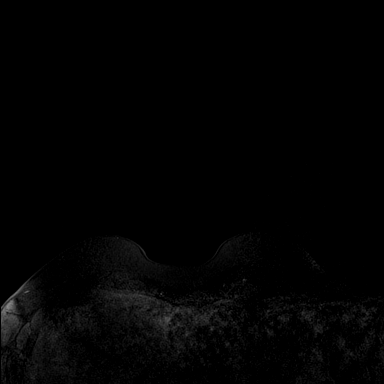
[im 44/176]
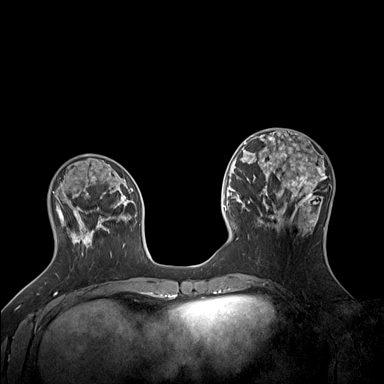
[im 88/176]
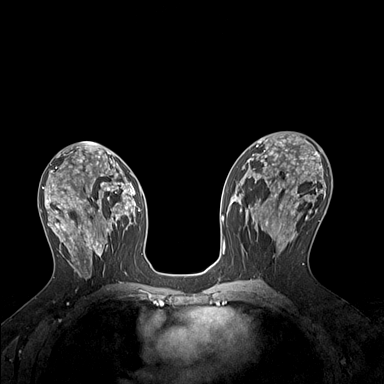
[im 132/176]
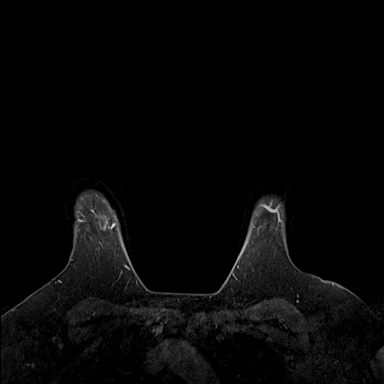
[im 176/176]
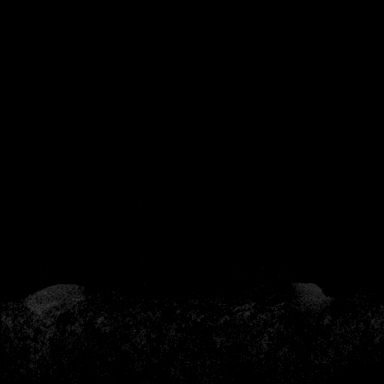

[Series 6: fl3d pre-cm 20 · axial · non-contrast · 1.2mm · 0.94mm/px · z∈[-69,+141]mm · 5 of 176 slices shown (2 of 3)]
[im 1/176]
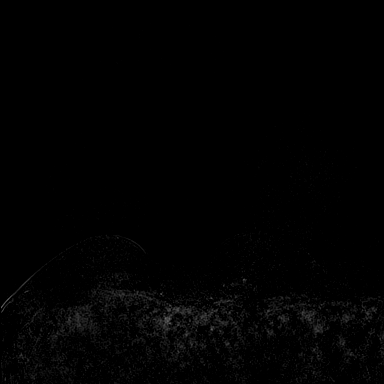
[im 44/176]
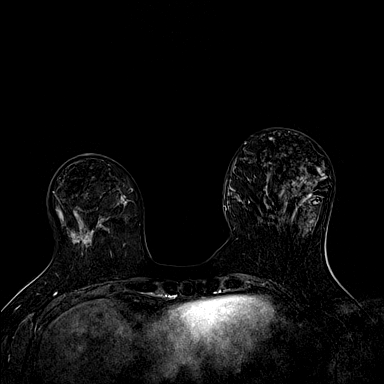
[im 88/176]
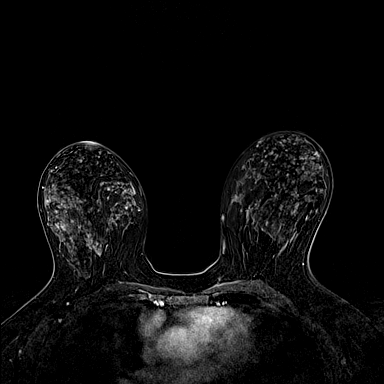
[im 132/176]
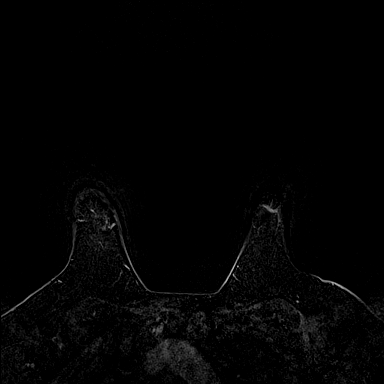
[im 176/176]
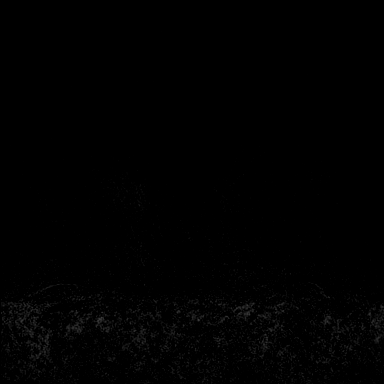

[Series 7: fl3d pre-cm 20 · axial · non-contrast · 211.2mm · 0.94mm/px · 1 of 1 slices shown (3 of 3)]
[im 1/1]
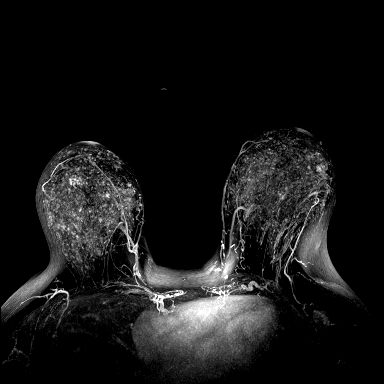

[Series 8: fl3d pre-cm 3min · axial · non-contrast · 1.2mm · 0.94mm/px · z∈[-69,+141]mm · 6 of 176 slices shown]
[im 1/176]
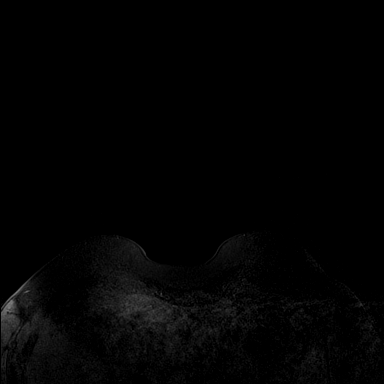
[im 36/176]
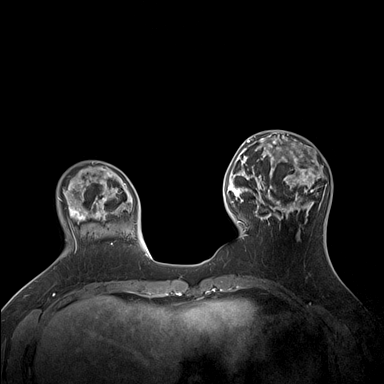
[im 71/176]
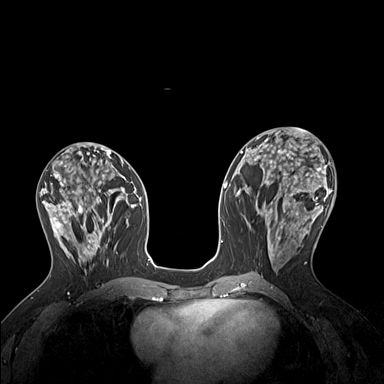
[im 106/176]
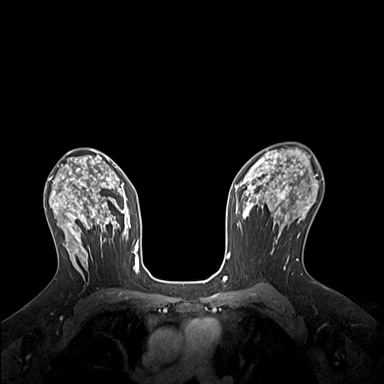
[im 141/176]
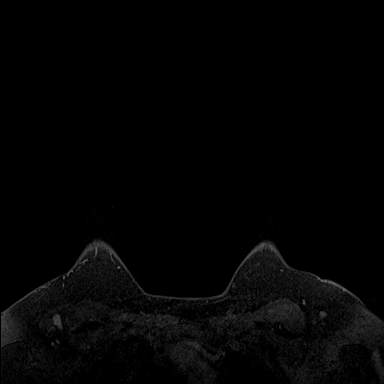
[im 176/176]
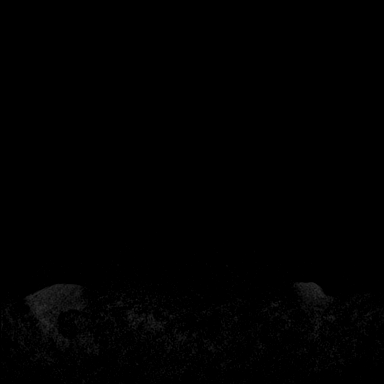

[Series 9: fl3d pre-cm 3min_sub · axial · non-contrast · 1.2mm · 0.94mm/px · z∈[-69,+57]mm · 4 of 176 slices shown]
[im 1/176]
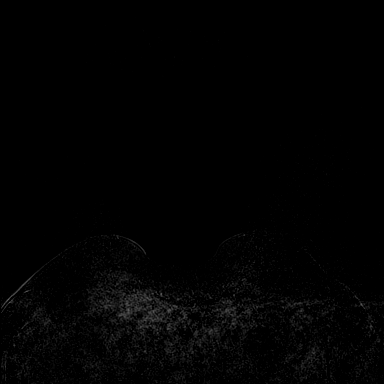
[im 36/176]
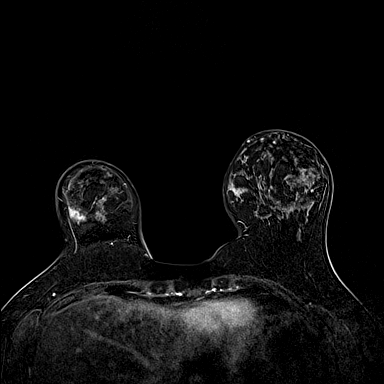
[im 71/176]
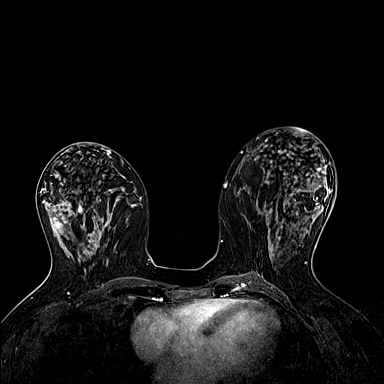
[im 106/176]
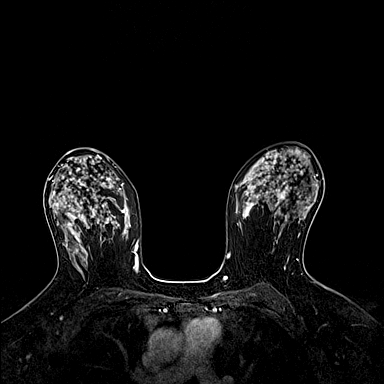

[32 of 48 positions shown; findings below may reference images not displayed]

Three-dimensional MR images were rendered by post-processing of the
original MR data on an independent workstation. The
three-dimensional MR images were interpreted, and findings are
reported in the following complete MRI report for this study. Three
dimensional images were evaluated at the independent interpreting
workstation using the DynaCAD thin client.
FINDINGS: Breast composition: d. Extreme fibroglandular tissue.

Background parenchymal enhancement: Marked, which limits the
sensitivity of breast MRI.

Right breast: No mass or abnormal enhancement.

Left breast: Susceptibility artifact can be seen in the lower outer
anterior left breast indicating the site of biopsy. There is a an
8-9 mm enhancing mass at this site (series 9, image 130). There is
some adjacent enhancement, however the intensity is more similar to
the patient's background parenchymal enhancement and may not be a
part of the known malignancy. The mass on ultrasound measured 9 mm.

Lymph nodes: There is 1 prominent left axillary lymph node (series
2, image 17). No abnormal right axillary lymph nodes.

Ancillary findings:  None.
IMPRESSION: 1. The biopsy proven malignancy in the lower outer left breast is
noted, measuring approximately 8-9 mm.

2.  There is 1 prominent left axillary lymph node.

3.  No evidence of malignancy in the right breast.

RECOMMENDATION:
1. Left axillary ultrasound is recommended. If any abnormal lymph
nodes are identified, ultrasound-guided biopsy would be recommended.

The ultrasound and possible biopsy is scheduled for [REDACTED],
[DATE].

BI-RADS CATEGORY  6: Known biopsy-proven malignancy.

## 2020-10-25 MED ORDER — GADOBUTROL 1 MMOL/ML IV SOLN
10.0000 mL | Freq: Once | INTRAVENOUS | Status: AC | PRN
  Administered 2020-10-25: 10 mL via INTRAVENOUS

## 2020-10-25 NOTE — Telephone Encounter (Signed)
SPOKE TO PATIENT to confirm afternoon clinic appointment for 10/12, packet emailed to patient

## 2020-10-25 NOTE — Telephone Encounter (Signed)
No PA Required.  Encounter closed.

## 2020-10-28 ENCOUNTER — Other Ambulatory Visit: Payer: Self-pay

## 2020-10-28 ENCOUNTER — Ambulatory Visit: Payer: Managed Care, Other (non HMO)

## 2020-10-28 ENCOUNTER — Other Ambulatory Visit: Payer: Self-pay | Admitting: Nurse Practitioner

## 2020-10-28 ENCOUNTER — Ambulatory Visit
Admission: RE | Admit: 2020-10-28 | Discharge: 2020-10-28 | Disposition: A | Discharge status: Home / Self Care | Payer: Managed Care, Other (non HMO) | Source: Ambulatory Visit | Attending: Nurse Practitioner | Admitting: Nurse Practitioner

## 2020-10-28 ENCOUNTER — Other Ambulatory Visit: Payer: Self-pay | Admitting: *Deleted

## 2020-10-28 DIAGNOSIS — C50412 Malignant neoplasm of upper-outer quadrant of left female breast: Secondary | ICD-10-CM | POA: Insufficient documentation

## 2020-10-28 DIAGNOSIS — R599 Enlarged lymph nodes, unspecified: Secondary | ICD-10-CM

## 2020-10-28 DIAGNOSIS — Z170 Estrogen receptor positive status [ER+]: Secondary | ICD-10-CM

## 2020-10-28 IMAGING — US US AXILLARY LEFT
1 series · 5 of 5 positions shown · non-contrast
Comparison: Previous exam(s).

CLINICAL DATA: 40-year-old female with recent diagnosis grade 1-2
invasive ductal carcinoma and DCIS of the left breast. A
questionable lymph node was found in the left axilla on her MRI
dated [DATE]. Though her report from [DATE] did mention that
her left axilla appeared normal, inadvertently images documenting
the axilla were not acquired.

EXAM:
ULTRASOUND OF THE LEFT AXILLA

[Series 1: us axillary left · 0.08mm/px · 5 of 5 slices shown]
[im 1/5]
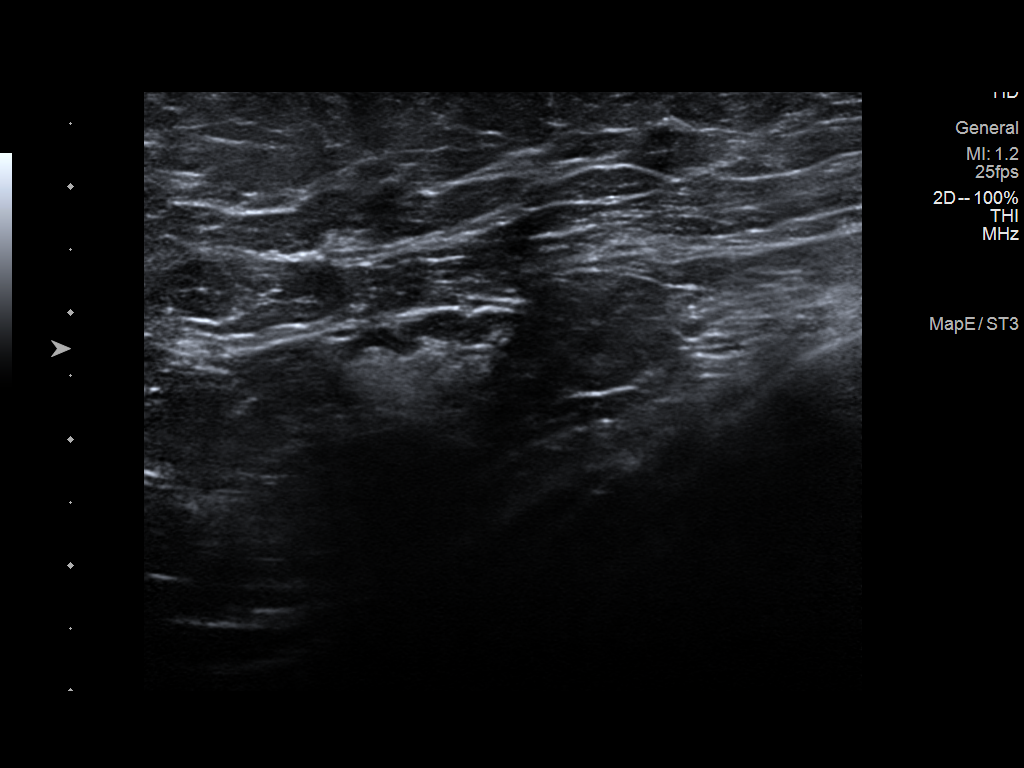
[im 2/5]
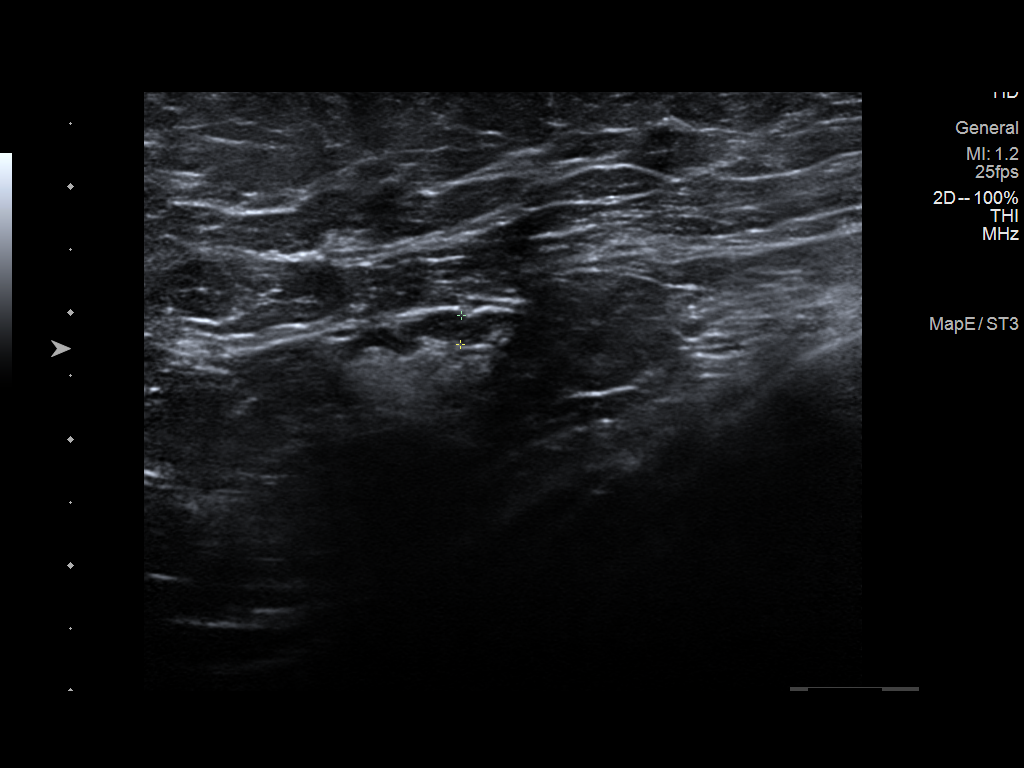
[im 3/5]
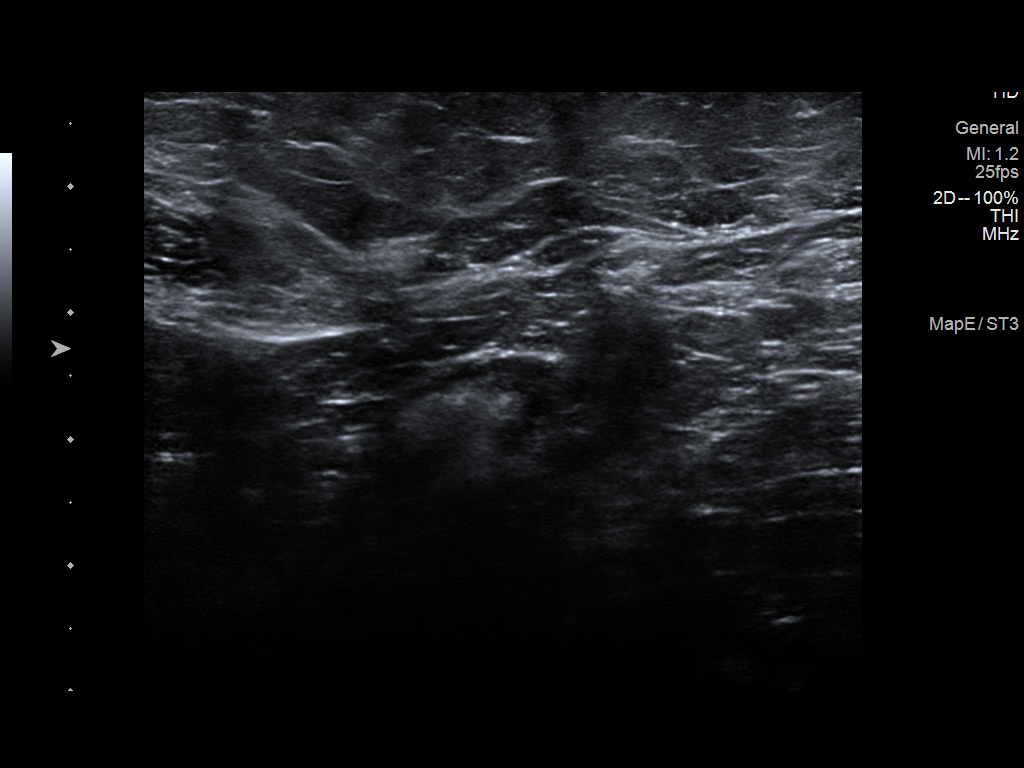
[im 4/5]
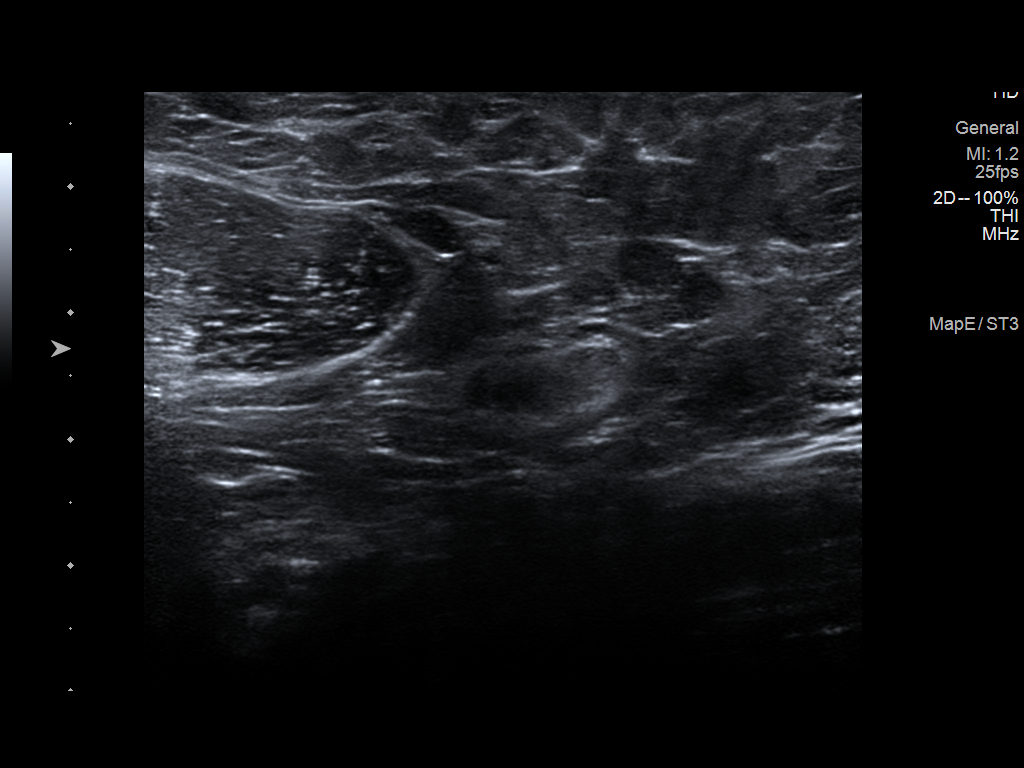
[im 5/5]
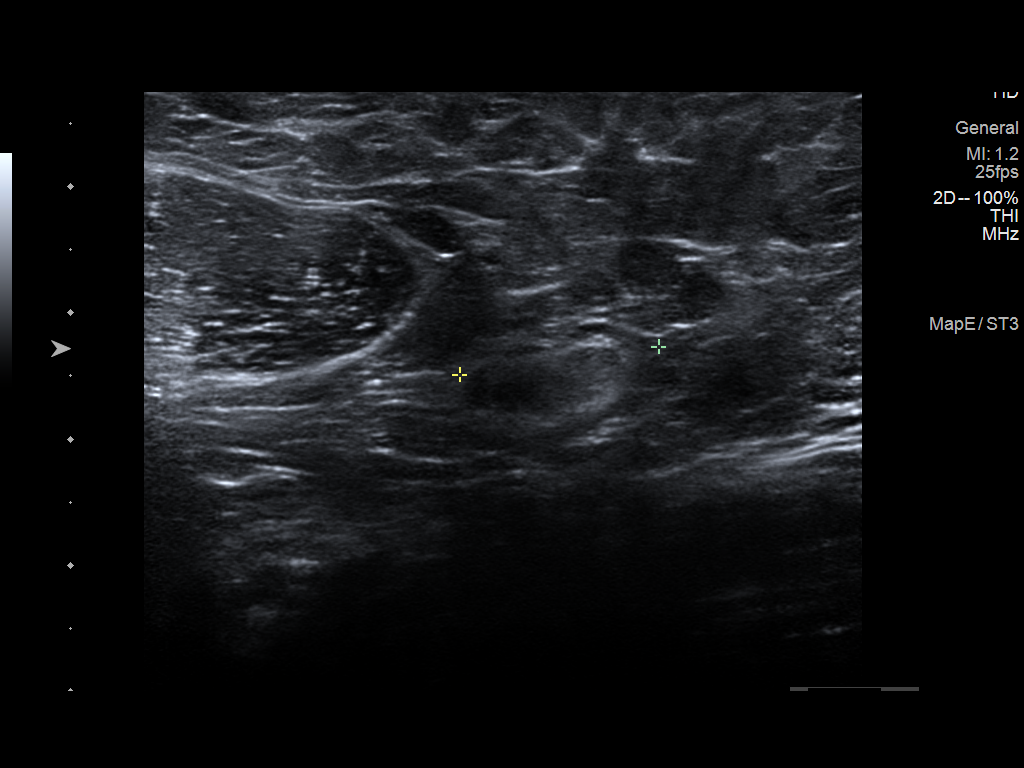

[5 of 5 positions shown; findings below may reference images not displayed]

FINDINGS: Ultrasound of the left axilla demonstrates multiple normal-appearing
lymph nodes. I believe that the lymph node corresponding to the 1
seen on MRI is visualized in the low left axilla, and has a cortex
of 2 mm, which is within normal limits.
IMPRESSION: Normal left axillary lymph nodes.

RECOMMENDATION:
Continue treatment plan for known left breast cancer.

I have discussed the findings and recommendations with the patient.
If applicable, a reminder letter will be sent to the patient
regarding the next appointment.

BI-RADS CATEGORY  1: Negative.

## 2020-10-29 NOTE — Progress Notes (Signed)
[facility] CONSULT NOTE  Patient Care Team: Patient, [name] as PCP - General (General Practice) Rockwell Germany, RN as Oncology Nurse Navigator Mauro Kaufmann, RN as Oncology Nurse Navigator Jovita Kussmaul, MD as Consulting Physician (General Surgery) Nicholas Lose, MD as Consulting Physician (Hematology and Oncology) Kyung Rudd, MD as Consulting Physician (Radiation Oncology)  CHIEF COMPLAINTS/PURPOSE OF CONSULTATION:  Newly diagnosed breast cancer  HISTORY OF PRESENTING ILLNESS:  Gabriela Evans 40 y.o. female is here because of recent diagnosis of cancer of the left breast. She presented with new focal thickening and pain her outer left breast. Diagnostic mammogram and [place] on 10/10/2020 showed indeterminate mass in the left breast at 3:00. 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 She presents to the clinic today for initial evaluation and discussion of treatment options.   I reviewed her records extensively and collaborated the history with the patient.  SUMMARY OF ONCOLOGIC HISTORY: Oncology History  Malignant neoplasm of upper-outer quadrant of left breast in female, estrogen receptor positive ([place])  10/28/2020 Initial Diagnosis   Presented with new focal thickening and pain her outer left breast. Diagnostic mammogram and [place]: indeterminate mass in the left breast at 3:00. 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  Breast MRI: 1 prominent lymph node and 9 mm mass       MEDICAL HISTORY:  Past Medical History:  Diagnosis Date   Anemia    Breast cancer ([place])    Complication of anesthesia    nausea   Kidney infection    Other abnormal glucose    Gestational diabetes with both children   Pneumonia    01-2019   PONV (postoperative nausea and vomiting)     SURGICAL HISTORY: Past Surgical History:   Procedure Laterality Date   CESAREAN SECTION     X2   IUD inserted in OR     PELVIC LAPAROSCOPY  2009   remove perforated IUD   ROBOTIC ASSISTED LAPAROSCOPIC LYSIS OF ADHESION N/A 04/26/2019   Procedure: XI ROBOTIC ASSISTED LAPAROSCOPIC LYSIS OF ADHESION;  Surgeon: [name], MD;  Location: [facility];  Service: Gynecology;  Laterality: N/A;   ROBOTIC ASSISTED TOTAL HYSTERECTOMY WITH BILATERAL SALPINGO OOPHERECTOMY Bilateral 04/26/2019   Procedure: XI ROBOTIC ASSISTED TOTAL LAPAROSCOPIC HYSTERECTOMY WITH BILATERAL SALPINGECTOMY;  Surgeon: [name], MD;  Location: [facility];  Service: Gynecology;  Laterality: Bilateral;  requests 8:30 OR start in [place] block time requests 2 hours   WISDOM TOOTH EXTRACTION      SOCIAL HISTORY: Social History   Socioeconomic History   Marital status: Married    Spouse name: Not on file   Number of children: Not on file   Years of education: Not on file   Highest education level: Not on file  Occupational History   Not on file  Tobacco Use   Smoking status: Never   Smokeless tobacco: Never  Vaping Use   Vaping Use: Never used  Substance and Sexual Activity   Alcohol use: Yes    Comment: occas   Drug use: No   Sexual activity: Yes    Birth control/protection: Surgical    Comment: Hyst-INTERCOURSE AGE 34, LESS THA N 5 SEXUAL PARTNERS  Other Topics Concern   Not on file  Social History Narrative   Not on file   Social Determinants of Health   Financial Resource Strain: Not on  file  Food Insecurity: Not on file  Transportation Needs: Not on file  Physical Activity: Not on file  Stress: Not on file  Social Connections: Not on file  Intimate Partner Violence: Not on file    FAMILY HISTORY: Family History  Problem Relation Age of Onset   Diabetes Mother    Thyroid disease Father    Diabetes Father    Thyroid disease Sister    Thyroid disease Maternal Aunt    Cancer Paternal  Aunt    Rectal cancer Paternal Uncle    Prostate cancer Maternal Grandfather    Melanoma Paternal Grandmother     ALLERGIES:  is allergic to nickel and oxycodone-acetaminophen.  MEDICATIONS:  Current Outpatient Medications  Medication Sig Dispense Refill   acetaminophen (TYLENOL) 500 MG tablet Take 500 mg by mouth every 6 (six) hours as needed.     BLACK ELDERBERRY,BERRY-FLOWER, PO Take 1 capsule by mouth daily.     Charcoal Activated (ACTIVATED CHARCOAL) POWD by Does not apply route as needed.     ibuprofen (ADVIL) 400 MG tablet Take 400 mg by mouth every 6 (six) hours as needed.     Medium Chain Triglycerides (MCT OIL PO) Take by mouth daily.     Multiple Vitamins-Minerals (MULTIVITAMIN WITH MINERALS) tablet Take 2 tablets by mouth daily.     Probiotic Product (PROBIOTIC PO) Take 1 tablet by mouth daily.     vitamin E 1000 UNIT capsule Take 1,000 Units by mouth daily.     vitamin C (ASCORBIC ACID) 250 MG tablet Take 250 mg by mouth daily. (Patient not taking: Reported on 10/30/2020)     No current facility-administered medications for this visit.    REVIEW OF SYSTEMS:   Constitutional: Denies fevers, chills or abnormal night sweats   All other systems were reviewed with the patient and are negative.  PHYSICAL EXAMINATION: ECOG PERFORMANCE STATUS: 1 - Symptomatic but completely ambulatory  Vitals:   10/30/20 1250  BP: 139/87  Pulse: 81  Resp: 18  Temp: 97.7 F (36.5 C)  SpO2: 100%   Filed Weights   10/30/20 1250  Weight: 209 lb (94.8 kg)      LABORATORY DATA:  I have reviewed the data as listed Lab Results  Component Value Date   WBC 8.9 10/30/2020   HGB 13.1 10/30/2020   HCT 38.6 10/30/2020   MCV 87.1 10/30/2020   PLT 291 10/30/2020   Lab Results  Component Value Date   NA 142 10/30/2020   K 4.0 10/30/2020   CL 110 10/30/2020   CO2 25 10/30/2020    RADIOGRAPHIC STUDIES: I have personally reviewed the radiological reports and agreed with the  findings in the report.  ASSESSMENT AND PLAN:  Malignant neoplasm of upper-outer quadrant of left breast in female, estrogen receptor positive ([place]) 10/23/2020:Presented with new focal thickening and pain her outer left breast. Diagnostic mammogram and [place]: indeterminate mass in the left breast at 3:00. 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  Breast MRI: 1 prominent lymph node and 9 mm mass  Pathology and radiology counseling:Discussed with the patient, the details of pathology including the type of breast cancer,the clinical staging, the significance of ER, PR and HER-2/neu receptors and the implications for treatment. After reviewing the pathology in detail, we proceeded to discuss the different treatment options between surgery, radiation, chemotherapy, antiestrogen therapies.  Recommendations: 1. Breast conserving surgery followed by 2. Oncotype DX testing to determine if chemotherapy would be of any  benefit followed by 3. Adjuvant radiation therapy followed by 4. Adjuvant antiestrogen therapy 5.  Genetic testing  Oncotype counseling: I discussed Oncotype DX test. I explained to the patient that this is a 21 gene panel to evaluate patient tumors DNA to calculate recurrence score. This would help determine whether patient has high risk or low risk breast cancer. She understands that if her tumor was found to be high risk, she would benefit from systemic chemotherapy. If low risk, no need of chemotherapy.  Contralateral breast pain: I discussed with her that may be sympathetic response.  Return to clinic after surgery to discuss final pathology report and then determine if Oncotype DX testing will need to be sent.    All questions were answered. The patient knows to call the clinic with any problems, questions or concerns.   Rulon Eisenmenger, MD, MPH 10/30/2020    I, Thana Ates, am acting as scribe for Nicholas Lose,  MD.  I have reviewed the above documentation for accuracy and completeness, and I agree with the above.

## 2020-10-30 ENCOUNTER — Inpatient Hospital Stay (HOSPITAL_BASED_OUTPATIENT_CLINIC_OR_DEPARTMENT_OTHER): Payer: Managed Care, Other (non HMO) | Admitting: Hematology and Oncology

## 2020-10-30 ENCOUNTER — Ambulatory Visit
Admission: RE | Admit: 2020-10-30 | Discharge: 2020-10-30 | Disposition: A | Discharge status: Home / Self Care | Payer: Managed Care, Other (non HMO) | Source: Ambulatory Visit | Attending: Radiation Oncology | Admitting: Radiation Oncology

## 2020-10-30 ENCOUNTER — Encounter: Payer: Self-pay | Admitting: Physical Therapy

## 2020-10-30 ENCOUNTER — Ambulatory Visit: Payer: Managed Care, Other (non HMO) | Attending: General Surgery | Admitting: Physical Therapy

## 2020-10-30 ENCOUNTER — Other Ambulatory Visit: Payer: Self-pay

## 2020-10-30 ENCOUNTER — Inpatient Hospital Stay (HOSPITAL_BASED_OUTPATIENT_CLINIC_OR_DEPARTMENT_OTHER): Payer: Managed Care, Other (non HMO) | Admitting: Genetic Counselor

## 2020-10-30 ENCOUNTER — Inpatient Hospital Stay: Payer: Managed Care, Other (non HMO)

## 2020-10-30 ENCOUNTER — Encounter: Payer: Self-pay | Admitting: *Deleted

## 2020-10-30 ENCOUNTER — Encounter: Payer: Self-pay | Admitting: General Practice

## 2020-10-30 ENCOUNTER — Encounter: Payer: Self-pay | Admitting: Hematology and Oncology

## 2020-10-30 ENCOUNTER — Ambulatory Visit: Payer: Self-pay | Admitting: General Surgery

## 2020-10-30 DIAGNOSIS — Z808 Family history of malignant neoplasm of other organs or systems: Secondary | ICD-10-CM

## 2020-10-30 DIAGNOSIS — Z170 Estrogen receptor positive status [ER+]: Secondary | ICD-10-CM | POA: Insufficient documentation

## 2020-10-30 DIAGNOSIS — Z809 Family history of malignant neoplasm, unspecified: Secondary | ICD-10-CM | POA: Insufficient documentation

## 2020-10-30 DIAGNOSIS — Z800 Family history of malignant neoplasm of digestive organs: Secondary | ICD-10-CM | POA: Diagnosis not present

## 2020-10-30 DIAGNOSIS — Z833 Family history of diabetes mellitus: Secondary | ICD-10-CM | POA: Insufficient documentation

## 2020-10-30 DIAGNOSIS — Z8349 Family history of other endocrine, nutritional and metabolic diseases: Secondary | ICD-10-CM | POA: Insufficient documentation

## 2020-10-30 DIAGNOSIS — Z8042 Family history of malignant neoplasm of prostate: Secondary | ICD-10-CM | POA: Insufficient documentation

## 2020-10-30 DIAGNOSIS — Z79899 Other long term (current) drug therapy: Secondary | ICD-10-CM | POA: Insufficient documentation

## 2020-10-30 DIAGNOSIS — C50412 Malignant neoplasm of upper-outer quadrant of left female breast: Secondary | ICD-10-CM

## 2020-10-30 DIAGNOSIS — R293 Abnormal posture: Secondary | ICD-10-CM | POA: Insufficient documentation

## 2020-10-30 LAB — CBC WITH DIFFERENTIAL (CANCER CENTER ONLY)
Abs Immature Granulocytes: 0.02 10*3/uL (ref 0.00–0.07)
Basophils Absolute: 0.1 10*3/uL (ref 0.0–0.1)
Basophils Relative: 1 %
Eosinophils Absolute: 0.1 10*3/uL (ref 0.0–0.5)
Eosinophils Relative: 2 %
HCT: 38.6 % (ref 36.0–46.0)
Hemoglobin: 13.1 g/dL (ref 12.0–15.0)
Immature Granulocytes: 0 %
Lymphocytes Relative: 29 %
Lymphs Abs: 2.6 10*3/uL (ref 0.7–4.0)
MCH: 29.6 pg (ref 26.0–34.0)
MCHC: 33.9 g/dL (ref 30.0–36.0)
MCV: 87.1 fL (ref 80.0–100.0)
Monocytes Absolute: 1 10*3/uL (ref 0.1–1.0)
Monocytes Relative: 11 %
Neutro Abs: 5.2 10*3/uL (ref 1.7–7.7)
Neutrophils Relative %: 57 %
Platelet Count: 291 10*3/uL (ref 150–400)
RBC: 4.43 MIL/uL (ref 3.87–5.11)
RDW: 12.2 % (ref 11.5–15.5)
WBC Count: 8.9 10*3/uL (ref 4.0–10.5)
nRBC: 0 % (ref 0.0–0.2)

## 2020-10-30 LAB — CMP (CANCER CENTER ONLY)
ALT: 19 U/L (ref 0–44)
AST: 17 U/L (ref 15–41)
Albumin: 4.2 g/dL (ref 3.5–5.0)
Alkaline Phosphatase: 46 U/L (ref 38–126)
Anion gap: 7 (ref 5–15)
BUN: 12 mg/dL (ref 6–20)
CO2: 25 mmol/L (ref 22–32)
Calcium: 9.8 mg/dL (ref 8.9–10.3)
Chloride: 110 mmol/L (ref 98–111)
Creatinine: 0.67 mg/dL (ref 0.44–1.00)
GFR, Estimated: >60 mL/min (ref >60)
Glucose, Bld: 95 mg/dL (ref 70–99)
Potassium: 4 mmol/L (ref 3.5–5.1)
Sodium: 142 mmol/L (ref 135–145)
Total Bilirubin: 0.4 mg/dL (ref 0.3–1.2)
Total Protein: 7.6 g/dL (ref 6.5–8.1)

## 2020-10-30 LAB — GENETIC SCREENING ORDER

## 2020-10-30 NOTE — Progress Notes (Signed)
[facility] Psychosocial Distress Screening Spiritual Care  Met with Nanea and her husband Quita Skye in [facility] to introduce [facility] team/resources, reviewing distress screen per protocol.  The patient scored a 6 on the Psychosocial Distress Thermometer which indicates severe distress. Also assessed for distress and other psychosocial needs.   ONCBCN DISTRESS SCREENING 10/30/2020  Screening Type Initial Screening  Distress experienced in past week (1-10) 6  Emotional problem type Nervousness/Anxiety;Adjusting to illness  Physical Problem type Pain  Referral to support programs Yes   Magda Paganini and Quita Skye report good support from parents, friends, coworkers, Theme park manager and church community. Couple is newly married and working hard at establishing healthy family habits and supportive conversations with their combined four children (7, almost 87, 75, 54).  Together we talked through strategies for helping increase comfort and mitigate anxiety when (and after) they tell the children about Selam's diagnosis and treatment plan. Encouraged [place] programming as an additional layer of support.   Follow up needed: Yes.  Plan to follow up by phone in ca 2 weeks for a pastoral check-in.   [place], [place], [facility] Pager [phone] Voicemail [phone]

## 2020-10-30 NOTE — Progress Notes (Signed)
REFERRING PROVIDER: Nicholas Lose, MD [place],  [place] [id number]  PRIMARY PROVIDER:  No PCP listed  Encounter Diagnoses  Name Primary?   Malignant neoplasm of upper-outer quadrant of left breast in female, estrogen receptor positive ([place]) Yes   Family history of prostate cancer    Family history of rectal cancer    Family history of thyroid cancer    Family history of malignant melanoma      HISTORY OF PRESENT ILLNESS:   Gabriela Evans, a 40 y.o. female, was seen for a [place] cancer genetics consultation during the breast multidisciplinary clinic at the request of Dr. Lindi Adie due to a personal and family history of cancer.  Gabriela Evans presents to clinic today to discuss the possibility of a hereditary predisposition to cancer, to discuss genetic testing, and to further clarify her future cancer risks, as well as potential cancer risks for family members.   In October 2022, at the age of 34, [name] was diagnosed with invasive ductal carcinoma of the left breast. The preliminary treatment plan includes breast conserving surgery, Oncotype to determine potential benefit of chemotherapy, adjuvant radiation,and adjuvant anti-estrogens.   CANCER HISTORY:  Oncology History  Malignant neoplasm of upper-outer quadrant of left breast in female, estrogen receptor positive ([place])  10/28/2020 Initial Diagnosis   Presented with new focal thickening and pain her outer left breast. Diagnostic mammogram and [place]: indeterminate mass in the left breast at 3:00. 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  Breast MRI: 1 prominent lymph node and 9 mm mass        RISK FACTORS:  Menarche was at age unknown.  First live birth at age 61.  OCP use for approximately 2 years.  Ovaries intact: yes.  Hysterectomy: yes and salpingectomy; April 2021 HRT use: 0 years. Colonoscopy: no; not examined. Mammogram within the  last year: yes. Up to date with pelvic exams: most recent PAP April 2021.   Past Medical History:  Diagnosis Date   Anemia    Breast cancer ([place])    Complication of anesthesia    nausea   Kidney infection    Other abnormal glucose    Gestational diabetes with both children   Pneumonia    01-2019   PONV (postoperative nausea and vomiting)     Past Surgical History:  Procedure Laterality Date   CESAREAN SECTION     X2   IUD inserted in [place]  2009   remove perforated IUD   ROBOTIC ASSISTED LAPAROSCOPIC LYSIS OF ADHESION N/A 04/26/2019   Procedure: XI ROBOTIC ASSISTED LAPAROSCOPIC LYSIS OF ADHESION;  Surgeon: [name], MD;  Location: [facility];  Service: Gynecology;  Laterality: N/A;   ROBOTIC ASSISTED TOTAL HYSTERECTOMY WITH BILATERAL SALPINGO OOPHERECTOMY Bilateral 04/26/2019   Procedure: XI ROBOTIC ASSISTED TOTAL LAPAROSCOPIC HYSTERECTOMY WITH BILATERAL SALPINGECTOMY;  Surgeon: [name], MD;  Location: [facility];  Service: Gynecology;  Laterality: Bilateral;  requests 8:30 OR start in [place] block time requests 2 hours   WISDOM TOOTH EXTRACTION       FAMILY HISTORY:  We obtained a detailed, 4-generation family history.  Significant diagnoses are listed below: Family History  Problem Relation Age of Onset   Thyroid cancer Maternal Aunt        papillary; dx 32s   Brain cancer Maternal Uncle 2       gliolblastoma   Rectal cancer Paternal Uncle  mets; dx 40s   Cancer Paternal Uncle        unknown type; dx 52s   Thyroid cancer Maternal Grandmother        follicular   Prostate cancer Maternal Grandfather        dx 40s   Melanoma Paternal Grandmother        mets; dx 69s     [name] is unaware of previous family history of genetic testing for hereditary cancer risks. There is reported Ashkenazi Jewish ancestry in her paternal family.  She reported Jewish ancestry in her maternal  grandmother but does not believe she was of [place] descent. There is no known consanguinity.  GENETIC COUNSELING ASSESSMENT: Gabriela Evans is a 40 y.o. female with a personal history of cancer which is somewhat suggestive of a hereditary cancer syndrome and predisposition to cancer given her age of diagnoisis. We, therefore, discussed and recommended the following at today's visit.   DISCUSSION: We discussed that 5 - 10% of cancer is hereditary, with most cases of hereditary breast cancer associated with mutations in BRCA1/2.  There are other genes that can be associated with hereditary breast cancer syndromes.  Type of cancer risk and level of risk are gene-specific. We discussed that testing is beneficial for several reasons including knowing how to follow individuals after completing their treatment, identifying whether potential treatment options would be beneficial, and understanding if other family members could be at risk for cancer and allowing them to undergo genetic testing.   We reviewed the characteristics, features and inheritance patterns of hereditary cancer syndromes. We also discussed genetic testing, including the appropriate family members to test, the process of testing, insurance coverage and turn-around-time for results. We discussed the implications of a negative, positive and/or variant of uncertain significant result. In order to get genetic test results in a timely manner so that Ms. Trigueros can use these genetic test results for surgical decisions, we recommended Ms. Haley pursue genetic testing for the [employer].  The BRCAplus panel offered by [employer] and includes sequencing and deletion/duplication analysis for the following 8 genes: ATM, BRCA1, BRCA2, CDH1, CHEK2, PALB2, PTEN, and TP53. Once complete, we recommend Ms. Guerin pursue reflex genetic testing to a more comprehensive gene panel.   The CancerNext-Expanded gene panel offered by [facility]  and includes sequencing, rearrangement, and RNA analysis for the following 77 genes: AIP, ALK, APC, ATM, AXIN2, BAP1, BARD1, BLM, BMPR1A, BRCA1, BRCA2, BRIP1, CDC73, CDH1, CDK4, CDKN1B, CDKN2A, CHEK2, CTNNA1, DICER1, FANCC, FH, FLCN, GALNT12, KIF1B, LZTR1, MAX, MEN1, MET, MLH1, MSH2, MSH3, MSH6, MUTYH, NBN, NF1, NF2, NTHL1, PALB2, PHOX2B, PMS2, POT1, PRKAR1A, PTCH1, PTEN, RAD51C, RAD51D, RB1, RECQL, RET, SDHA, SDHAF2, SDHB, SDHC, SDHD, SMAD4, SMARCA4, SMARCB1, SMARCE1, STK11, SUFU, TMEM127, TP53, TSC1, TSC2, VHL and XRCC2 (sequencing and deletion/duplication); EGFR, EGLN1, HOXB13, KIT, MITF, PDGFRA, POLD1, and POLE (sequencing only); EPCAM and GREM1 (deletion/duplication only).   Based on Ms. Conran's personal history of cancer, she meets medical criteria for genetic testing. Despite that she meets criteria, she may still have an out of pocket cost. We discussed that if her out of pocket cost for testing is over $100, the laboratory should contact them to discuss self-pay prices, patient pay assistance programs, if applicable, and other billing options.   PLAN: After considering the risks, benefits, and limitations, Ms. Zamarron provided informed consent to pursue genetic testing and the blood sample was sent to [facility] for analysis of the [place] and CancerNext-Expanded +RNAinsight Panels. Results  should be available within approximately 1-2 weeks' time, at which point they will be disclosed by telephone to Ms. Schnapp, as will any additional recommendations warranted by these results. Ms. Jeffries will receive a summary of her genetic counseling visit and a copy of her results once available. This information will also be available in Epic.   Lastly, we encouraged Ms. Lacap to remain in contact with cancer genetics annually so that we can continuously update the family history and inform her of any changes in cancer genetics and testing that may be of benefit for this family.   Ms. Chavous  questions were answered to her satisfaction today. Our contact information was provided should additional questions or concerns arise. Thank you for the referral and allowing [place] to share in the care of your patient.   Danniell Rotundo M. Joette Catching, [place], [facility] Genetic Counselor [email or web address] (P) [phone]  The patient was seen for a total of 20 minutes in face-to-face genetic counseling.  The patient was accompanied by her husband, Adam.  Drs. Magrinat, Lindi Adie and/or Burr Medico were available to discuss this case as needed.  _______________________________________________________________________ For Office Staff:  Number of people involved in session: 2 Was an Intern/ student involved with case: no

## 2020-10-30 NOTE — Progress Notes (Signed)
Radiation Oncology         [phone] ________________________________  Name: Gabriela Evans        MRN: [id number]  Date of Service: 10/30/2020 DOB: 09-Sep-1980  WE:[id number], [name]  Jovita Kussmaul, MD     REFERRING PHYSICIAN: Autumn Messing III, MD   DIAGNOSIS: The encounter diagnosis was Malignant neoplasm of upper-outer quadrant of left breast in female, estrogen receptor positive ([place]).   HISTORY OF PRESENT ILLNESS: Gabriela Evans is a 40 y.o. female seen in the multidisciplinary breast clinic for a new diagnosis of left breast cancer. The patient was noted to have pain as well as changes of the skin of the left breast.  She presented for diagnostic imaging which revealed a mass in the 3 o'clock position as well as dense breast tissue.  By ultrasound the mass measured 9 mm in greatest extent and her axilla was negative for adenopathy.  She did undergo an MRI scan of both breasts on 10/25/2020 which revealed her known 9 mm finding in the 3 o'clock position and 1 abnormal appearing axillary lymph node.  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  Her cancer was ER/PR positive, HER2 was negative with a Ki-67 of 10%.  She is seen today to discuss treatment recommendations of her cancer.    PREVIOUS RADIATION THERAPY: No   PAST MEDICAL HISTORY:  Past Medical History:  Diagnosis Date   Anemia    Complication of anesthesia    nausea   Kidney infection    Other abnormal glucose    Gestational diabetes with both children   Pneumonia    01-2019   PONV (postoperative nausea and vomiting)        PAST SURGICAL HISTORY: Past Surgical History:  Procedure Laterality Date   CESAREAN SECTION     X2   IUD inserted in OR     PELVIC LAPAROSCOPY  2009   remove perforated IUD   ROBOTIC ASSISTED LAPAROSCOPIC LYSIS OF ADHESION N/A 04/26/2019   Procedure: XI ROBOTIC ASSISTED  LAPAROSCOPIC LYSIS OF ADHESION;  Surgeon: [name], MD;  Location: [facility];  Service: Gynecology;  Laterality: N/A;   ROBOTIC ASSISTED TOTAL HYSTERECTOMY WITH BILATERAL SALPINGO OOPHERECTOMY Bilateral 04/26/2019   Procedure: XI ROBOTIC ASSISTED TOTAL LAPAROSCOPIC HYSTERECTOMY WITH BILATERAL SALPINGECTOMY;  Surgeon: [name], MD;  Location: [facility];  Service: Gynecology;  Laterality: Bilateral;  requests 8:30 OR start in [place] Gyn block time requests 2 hours   WISDOM TOOTH EXTRACTION       FAMILY HISTORY:  Family History  Problem Relation Age of Onset   Diabetes Mother    Thyroid disease Father    Diabetes Father    Thyroid disease Sister    Thyroid disease Maternal Aunt    Cancer Paternal Aunt      SOCIAL HISTORY:  reports that she has never smoked. She has never used smokeless tobacco. She reports current alcohol use. She reports that she does not use drugs.  The patient is married and lives in [place].  She is accompanied by her husband. She works as an Tax adviser at [employer]. She has two children and two stepchildren. 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    ALLERGIES: Nickel and Oxycodone-acetaminophen   MEDICATIONS:  Current Outpatient Medications  Medication Sig Dispense Refill   BLACK ELDERBERRY,BERRY-FLOWER, PO Take 1 capsule by mouth daily as needed (cold  symptoms).      Multiple Vitamins-Minerals (MULTIVITAMIN WITH MINERALS) tablet Take 1 tablet by mouth daily.     Probiotic Product (PROBIOTIC PO) Take 1 tablet by mouth daily.     vitamin C (ASCORBIC ACID) 250 MG tablet Take 250 mg by mouth daily.     No current facility-administered medications for this encounter.     REVIEW OF SYSTEMS: On review of systems, the patient reports that she is doing well with her diagnosis and is very much aware of the process given what she does for a living. No specific breast complaints are noted. No other concerns are noted.      PHYSICAL EXAM:  Wt Readings from Last 3 Encounters:  10/08/20 209 lb (94.8 kg)  10/16/19 205 lb (93 kg)  04/26/19 208 lb 12.8 oz (94.7 kg)   Temp Readings from Last 3 Encounters:  10/16/19 98.6 F (37 C) (Oral)  04/26/19 98.2 F (36.8 C)  04/24/19 98.4 F (36.9 C) (Oral)   BP Readings from Last 3 Encounters:  10/08/20 124/80  10/16/19 (!) 128/97  06/15/19 136/84   Pulse Readings from Last 3 Encounters:  10/16/19 76  04/26/19 (!) 55  04/24/19 75    In general this is a well appearing Caucasian female in no acute distress. She's alert and oriented x4 and appropriate throughout the examination. Cardiopulmonary assessment is negative for acute distress and she exhibits normal effort. Bilateral breast exam is deferred.    ECOG = 0  0 - Asymptomatic (Fully active, able to carry on all predisease activities without restriction)  1 - Symptomatic but completely ambulatory (Restricted in physically strenuous activity but ambulatory and able to carry out work of a light or sedentary nature. For example, light housework, office work)  2 - Symptomatic, <50% in bed during the day (Ambulatory and capable of all self care but unable to carry out any work activities. Up and about more than 50% of waking hours)  3 - Symptomatic, >50% in bed, but not bedbound (Capable of only limited self-care, confined to bed or chair 50% or more of waking hours)  4 - Bedbound (Completely disabled. Cannot carry on any self-care. Totally confined to bed or chair)  5 - Death   Eustace Pen MM, Creech RH, Tormey DC, et al. [phone]). "Toxicity and response criteria of the [facility]". [place] Oncol. 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 She presents for breast MRI to determine extent of disease. Patient has no known family history of breast cancer. LABS:  Creatinine of 0.72 mg/dL on 10/08/2020 EXAM: BILATERAL BREAST MRI WITH AND WITHOUT CONTRAST TECHNIQUE: Multiplanar, multisequence MR images of both breasts were obtained prior to and following the intravenous administration of 10 ml of Gadavist  Three-dimensional MR images were rendered by post-processing of the original MR data on an independent workstation. The three-dimensional MR images were interpreted, and findings are reported in the following complete MRI report for this study. Three dimensional images were evaluated at the independent interpreting workstation using the DynaCAD thin client. COMPARISON:  No prior MRI available for comparison. Correlation made with prior mammogram and ultrasound images. FINDINGS: Breast composition: d. Extreme fibroglandular tissue. Background parenchymal enhancement: Marked, which limits the sensitivity of breast MRI. Right breast: No mass or abnormal enhancement. Left breast: Susceptibility artifact can be seen in the lower outer anterior left breast indicating the site of biopsy. There is a an 8-9 mm enhancing mass at this site (series 9, image 130). There is some adjacent enhancement, however the intensity is more similar to the patient's background parenchymal enhancement and may not be a part of the known malignancy. The mass on ultrasound measured 9 mm. Lymph nodes: There is 1 prominent left axillary lymph node (series 2, image 17). No abnormal right axillary lymph nodes. Ancillary  findings:  None. IMPRESSION: 1. The biopsy proven malignancy in the lower outer left breast is noted, measuring approximately 8-9 mm. 2.  There is 1 prominent left axillary lymph node. 3.  No evidence of malignancy in the right breast. RECOMMENDATION: 1. Left axillary ultrasound is recommended. If any abnormal lymph nodes are identified, ultrasound-guided biopsy would be recommended. The ultrasound and possible biopsy is scheduled for Monday, 10/28/20. BI-RADS CATEGORY  6: Known biopsy-proven malignancy. Electronically Signed   By: Ammie Ferrier M.D.   On: 10/25/2020 15:27  US BREAST LTD UNI LEFT INC AXILLA  Result Date: 10/10/2020 CLINICAL DATA:  40 year old female presenting with new focal thickening and pain in the outer left breast. EXAM: DIGITAL DIAGNOSTIC BILATERAL MAMMOGRAM WITH TOMOSYNTHESIS AND CAD; ULTRASOUND LEFT BREAST LIMITED TECHNIQUE: Bilateral digital diagnostic mammography and breast tomosynthesis was performed. The images were evaluated with computer-aided detection.; Targeted ultrasound examination of the left breast was performed. COMPARISON:  None. ACR Breast Density Category d: The breast tissue is extremely dense, which lowers the sensitivity of mammography. FINDINGS: Mammogram: Right breast: No suspicious mass, distortion, or microcalcifications are identified to suggest presence of malignancy. Left breast: There is dense tissue throughout the outer left breast without a definite discrete mass or distortion. No suspicious microcalcifications. There is no evidence of skin thickening. On physical exam of the left breast at the site of concern reported by the patient I feel regional thickening in the upper outer aspect without a fixed mass. There are no obvious skin changes. Ultrasound: Targeted ultrasound is performed throughout the upper-outer quadrant of the left breast demonstrating a likely incidental hypoechoic mass with indistinct margins at 3 o'clock 4 cm from the nipple  measuring 0.9 x 0.5 x 0.5 cm. No internal vascularity. There is no additional suspicious cystic or solid mass. Targeted ultrasound the left axilla demonstrates normal lymph nodes. IMPRESSION: 1. Indeterminate mass in the left breast at 3 o'clock, possibly a fibroadenoma. 2. Palpable area of thickening in the upper outer left breast without a mammographic or sonographic correlate. RECOMMENDATION: 1. Ultrasound-guided core needle biopsy of the left breast mass at 3 o'clock. 2. Following biopsy of the left breast mass at 3 o'clock recommend diagnostic breast MRI for evaluation of the new left palpable thickening. I have discussed the findings and recommendations with the patient. If applicable, a reminder letter will be sent to the patient regarding the next appointment. BI-RADS CATEGORY  4: Suspicious. Electronically Signed  By: Audie Pinto M.D.   On: 10/10/2020 12:59  MM DIAG BREAST TOMO BILATERAL  Result Date: 10/10/2020 CLINICAL DATA:  40 year old female presenting with new focal thickening and pain in the outer left breast. EXAM: DIGITAL DIAGNOSTIC BILATERAL MAMMOGRAM WITH TOMOSYNTHESIS AND CAD; ULTRASOUND LEFT BREAST LIMITED TECHNIQUE: Bilateral digital diagnostic mammography and breast tomosynthesis was performed. The images were evaluated with computer-aided detection.; Targeted ultrasound examination of the left breast was performed. COMPARISON:  None. ACR Breast Density Category d: The breast tissue is extremely dense, which lowers the sensitivity of mammography. FINDINGS: Mammogram: Right breast: No suspicious mass, distortion, or microcalcifications are identified to suggest presence of malignancy. Left breast: There is dense tissue throughout the outer left breast without a definite discrete mass or distortion. No suspicious microcalcifications. There is no evidence of skin thickening. On physical exam of the left breast at the site of concern reported by the patient I feel regional thickening  in the upper outer aspect without a fixed mass. There are no obvious skin changes. Ultrasound: Targeted ultrasound is performed throughout the upper-outer quadrant of the left breast demonstrating a likely incidental hypoechoic mass with indistinct margins at 3 o'clock 4 cm from the nipple measuring 0.9 x 0.5 x 0.5 cm. No internal vascularity. There is no additional suspicious cystic or solid mass. Targeted ultrasound the left axilla demonstrates normal lymph nodes. IMPRESSION: 1. Indeterminate mass in the left breast at 3 o'clock, possibly a fibroadenoma. 2. Palpable area of thickening in the upper outer left breast without a mammographic or sonographic correlate. RECOMMENDATION: 1. Ultrasound-guided core needle biopsy of the left breast mass at 3 o'clock. 2. Following biopsy of the left breast mass at 3 o'clock recommend diagnostic breast MRI for evaluation of the new left palpable thickening. I have discussed the findings and recommendations with the patient. If applicable, a reminder letter will be sent to the patient regarding the next appointment. BI-RADS CATEGORY  4: Suspicious. Electronically Signed   By: Audie Pinto M.D.   On: 10/10/2020 12:59  [place] AXILLA LEFT  Result Date: 10/28/2020 CLINICAL DATA:  40 year old female with recent diagnosis grade 1-2 invasive ductal carcinoma and DCIS of the left breast. A questionable lymph node was found in the left axilla on her MRI dated 10/25/2020. Though her report from 10/10/2020 did mention that her left axilla appeared normal, inadvertently images documenting the axilla were not acquired. EXAM: ULTRASOUND OF THE LEFT AXILLA COMPARISON:  Previous exam(s). FINDINGS: Ultrasound of the left axilla demonstrates multiple normal-appearing lymph nodes. I believe that the lymph node corresponding to the 1 seen on MRI is visualized in the low left axilla, and has a cortex of 2 mm, which is within normal limits. IMPRESSION: Normal left axillary lymph nodes.  RECOMMENDATION: Continue treatment plan for known left breast cancer. I have discussed the findings and recommendations with the patient. If applicable, a reminder letter will be sent to the patient regarding the next appointment. BI-RADS CATEGORY  1: Negative. Electronically Signed   By: Ammie Ferrier M.D.   On: 10/28/2020 12:44  MM CLIP PLACEMENT LEFT  Result Date: 10/23/2020 CLINICAL DATA:  Status post ultrasound-guided core biopsy of LEFT breast. EXAM: 3D DIAGNOSTIC LEFT MAMMOGRAM POST ULTRASOUND BIOPSY COMPARISON:  Previous exam(s). FINDINGS: 3D Mammographic images were obtained following ultrasound guided biopsy of mass in the 3 o'clock location of the LEFT breast and placement of a ribbon shaped clip. The biopsy marking clip is in expected position at the site of biopsy. IMPRESSION: Appropriate positioning of  the ribbon shaped biopsy marking clip at the site of biopsy in the LATERAL LEFT breast. Final Assessment: Post Procedure Mammograms for Marker Placement Electronically Signed   By: Nolon Nations M.D.   On: 10/23/2020 09:24  [place] LT BREAST BX W LOC DEV 1ST LESION IMG BX SPEC US GUIDE  Addendum Date: 10/24/2020   ADDENDUM REPORT: 10/24/2020 14:58 ADDENDUM: Pathology revealed GRADE I-II INVASIVE DUCTAL CARCINOMA WITH EXTRACELLULAR MUCIN, DUCTAL CARCINOMA IN SITU of the LEFT breast, 3 o'clock, 4cmfn, (ribbon clip). This was found to be concordant by Dr. Nolon Nations. Pathology results were discussed with the patient by telephone. The patient reported doing well after the biopsy with tenderness at the site. Post biopsy instructions and care were reviewed and questions were answered. The patient was encouraged to call [facility] for any additional concerns. My direct phone number was provided. The patient was referred to [facility] on October 30, 2020. Recommendation for a bilateral breast  MRI given the lesion is not seen mammographically. Patient has category D-extremely dense breast tissue and young age to exclude any additional sites of disease. Pathology results reported by Terie Purser, RN on 10/24/2020. Electronically Signed   By: Nolon Nations M.D.   On: 10/24/2020 14:58   Result Date: 10/24/2020 CLINICAL DATA:  Patient presents for ultrasound-guided core biopsy of the LEFT breast. EXAM: ULTRASOUND GUIDED LEFT BREAST CORE NEEDLE BIOPSY COMPARISON:  Previous exam(s). PROCEDURE: I met with the patient and we discussed the procedure of ultrasound-guided biopsy, including benefits and alternatives. We discussed the high likelihood of a successful procedure. We discussed the risks of the procedure, including infection, bleeding, tissue injury, clip migration, and inadequate sampling. Informed written consent was given. The usual time-out protocol was performed immediately prior to the procedure. Lesion quadrant: 3 o'clock LEFT breast Using sterile technique and 1% Lidocaine as local anesthetic, under direct ultrasound visualization, a 12 gauge spring-loaded device was used to perform biopsy of mass in the 3 o'clock location of the LEFT breast 4 centimeters from the nipple using a LATERAL to MEDIAL approach. At the conclusion of the procedure ribbon shaped tissue marker clip was deployed into the biopsy cavity. Follow up 2 view mammogram was performed and dictated separately. IMPRESSION: Ultrasound guided biopsy of LEFT breast mass. No apparent complications. Electronically Signed: By: Nolon Nations M.D. On: 10/23/2020 08:20      IMPRESSION/PLAN: 1. Stage IA, [id number], grade 2, ER/PR positive invasive ductal carcinoma of the left breast. Dr. Lisbeth Renshaw discusses the pathology findings and reviews the nature of early stage left breast disease. The consensus from the breast conference includes breast conservation with lumpectomy with  sentinel node biopsy. Dr. Lindi Adie will likely order Oncotype Dx  score to determine a role for systemic therapy. Provided that chemotherapy is not indicated, the patient's course would then be followed by external radiotherapy to the breast  to reduce risks of local recurrence followed by antiestrogen therapy. We discussed the risks, benefits, short, and long term effects of radiotherapy, as well as the curative intent, and the patient is in agreement. Dr. Lisbeth Renshaw discusses the delivery and logistics of radiotherapy and anticipates a course of 6 1/2 weeks of radiotherapy to the left breast with deep inspiration breath hold technique.  She is interested in treatment closer to home in [place], and will be referred to Dr. Baruch Gouty. She's aware of the  radiotherapy starting about 4-6 weeks after surgery.  2.  Possible genetic predisposition to malignancy. The patient is a candidate for genetic testing given her personal and family history. She was offered referral and will meet genetics today.  3. Contraceptive Counseling. No pregnancy testing is indicated due to prior hysterectomy.   In a visit lasting 60 minutes, greater than 50% of the time was spent face to face reviewing her case, as well as in preparation of, discussing, and coordinating the patient's care.  The above documentation reflects my direct findings during this shared patient visit. Please see the separate note by Dr. Lisbeth Renshaw on this date for the remainder of the patient's plan of care.    Carola Rhine, [facility]    **Disclaimer: This note was dictated with voice recognition software. Similar sounding words can inadvertently be transcribed and this note may contain transcription errors which may not have been corrected upon publication of note.**

## 2020-10-30 NOTE — Therapy (Signed)
[facility] @ [place], [place], [address] Phone: [phone]   Fax:  [phone]  Physical Therapy Evaluation  Patient Details  Name: Gabriela Evans MRN: [id number] Date of Birth: 04-29-1980 Referring Provider (PT): Dr. Autumn Messing   Encounter Date: 10/30/2020   PT End of Session - 10/30/20 1713     Visit Number 1    Number of Visits 2    Date for PT Re-Evaluation 12/25/20    PT Start Time 7001    PT Stop Time 1445    PT Time Calculation (min) 28 min    Activity Tolerance Patient tolerated treatment well    Behavior During Therapy [facility] for tasks assessed/performed             Past Medical History:  Diagnosis Date   Anemia    Breast cancer ([place])    Complication of anesthesia    nausea   Kidney infection    Other abnormal glucose    Gestational diabetes with both children   Pneumonia    01-2019   PONV (postoperative nausea and vomiting)     Past Surgical History:  Procedure Laterality Date   CESAREAN SECTION     X2   IUD inserted in [place]  2009   remove perforated IUD   ROBOTIC ASSISTED LAPAROSCOPIC LYSIS OF ADHESION N/A 04/26/2019   Procedure: XI ROBOTIC ASSISTED LAPAROSCOPIC LYSIS OF ADHESION;  Surgeon: [name], MD;  Location: [facility];  Service: Gynecology;  Laterality: N/A;   ROBOTIC ASSISTED TOTAL HYSTERECTOMY WITH BILATERAL SALPINGO OOPHERECTOMY Bilateral 04/26/2019   Procedure: XI ROBOTIC ASSISTED TOTAL LAPAROSCOPIC HYSTERECTOMY WITH BILATERAL SALPINGECTOMY;  Surgeon: [name], MD;  Location: [facility];  Service: Gynecology;  Laterality: Bilateral;  requests 8:30 OR start in [place] block time requests 2 hours   WISDOM TOOTH EXTRACTION      There were no vitals filed for this visit.    Subjective Assessment - 10/30/20 1703     Subjective Patient reports she is here today to be seen by her medical team  for her newly diagnosed left breast cancer.    Pertinent History Patient was diagnosed on 10/10/2020 with left grade II invasive ductal carcinoma breast cancer. It measures 9 mm and is located in the upper outer quadrant. It is ER/PR positive and HER2 negative with a Ki67 of 10%. She underwent a partial hysterectomy (ovaries still remain) in 04/2019.    Patient Stated Goals Reduce lymphedema risk and learn post op ROM HEP    Currently in Pain? No/denies   Except pain in left breast where she had biopsies               [facility] PT Assessment - 10/30/20 0001       Assessment   Medical Diagnosis Left breast cancer    Referring Provider (PT) Dr. Autumn Messing    Onset Date/Surgical Date 10/10/20    Hand Dominance Right    Prior Therapy none      Precautions   Precautions Other (comment)    Precaution Comments active cancer      Restrictions   Weight Bearing Restrictions No      Balance Screen   Has the patient fallen in the past 6 months No    Has the patient had a decrease in activity level because of a fear of falling?  No    Is the patient  reluctant to leave their home because of a fear of falling?  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    Leisure She walks 3x/week for 40 min      Cognition   Overall Cognitive Status Within Functional Limits for tasks assessed      Posture/Postural Control   Posture/Postural Control Postural limitations    Postural Limitations Rounded Shoulders;Forward head      ROM / Strength   AROM / PROM / Strength AROM;Strength      AROM   Overall AROM Comments Cervical AROM is WNL    AROM Assessment Site Shoulder    Right/Left Shoulder Right;Left     Right Shoulder Extension 54 Degrees    Right Shoulder Flexion 155 Degrees    Right Shoulder ABduction 158 Degrees    Right Shoulder Internal Rotation 70 Degrees    Right Shoulder External Rotation 90 Degrees    Left Shoulder Extension 52 Degrees    Left Shoulder Flexion 149 Degrees    Left Shoulder ABduction 162 Degrees    Left Shoulder Internal Rotation 58 Degrees    Left Shoulder External Rotation 87 Degrees      Strength   Overall Strength Within functional limits for tasks performed               LYMPHEDEMA/ONCOLOGY QUESTIONNAIRE - 10/30/20 0001       Type   Cancer Type Left breast cancer      Lymphedema Assessments   Lymphedema Assessments Upper extremities      Right Upper Extremity Lymphedema   10 cm Proximal to Olecranon Process 34.5 cm    Olecranon Process 27.9 cm    10 cm Proximal to Ulnar Styloid Process 24.9 cm    Just Proximal to Ulnar Styloid Process 17.4 cm    Across Hand at [employer] 20.8 cm    At [place] of 2nd Digit 6.9 cm      Left Upper Extremity Lymphedema   10 cm Proximal to Olecranon Process 35.3 cm    Olecranon Process 28.3 cm    10 cm Proximal to Ulnar Styloid Process 26.2 cm    Just Proximal to Ulnar Styloid Process 18 cm    Across Hand at [employer] 21.2 cm    At [place] of 2nd Digit 6.9 cm             L-DEX FLOWSHEETS - 10/30/20 1700       L-DEX LYMPHEDEMA SCREENING   Measurement Type Unilateral    L-DEX MEASUREMENT EXTREMITY Upper Extremity    POSITION  Standing    DOMINANT SIDE Right    At Risk Side Left    BASELINE SCORE (UNILATERAL) -3.4             The patient was assessed using the L-Dex machine today to produce a lymphedema index baseline score. The patient will be reassessed on a regular basis (typically every 3 months) to obtain new L-Dex scores. If the score is > 6.5 points away from his/her baseline score indicating onset of subclinical lymphedema, it will be recommended to wear a compression garment  for 4  weeks, 12 hours per day and then be reassessed. If the score continues to be > 6.5 points from baseline at reassessment, we will initiate lymphedema treatment. Assessing in this manner has a 95% rate of preventing clinically significant lymphedema.      Katina Dung - 10/30/20 0001     Open a tight or new jar No difficulty    Do heavy household chores (wash walls, wash floors) No difficulty    Carry a shopping bag or briefcase No difficulty    Wash your back No difficulty    Use a knife to cut food No difficulty    Recreational activities in which you take some force or impact through your arm, shoulder, or hand (golf, hammering, tennis) No difficulty    During the past week, to what extent has your arm, shoulder or hand problem interfered with your normal social activities with family, friends, neighbors, or groups? Slightly    During the past week, to what extent has your arm, shoulder or hand problem limited your work or other regular daily activities [place]    Arm, shoulder, or hand pain. None    Tingling (pins and needles) in your arm, shoulder, or hand None    Difficulty Sleeping No difficulty    DASH Score 6.82 %              Objective measurements completed on examination: See above findings.       Patient was instructed today in a home exercise program today for post op shoulder range of motion. These included active assist shoulder flexion in sitting, scapular retraction, wall walking with shoulder abduction, and hands behind head external rotation.  She was encouraged to do these twice a day, holding 3 seconds and repeating 5 times when permitted by her physician.           PT Education - 10/30/20 1712     Education Details Lymphedema risk reduction and post op HEP    Person(s) Educated Patient;Spouse    Methods Explanation;Demonstration;Handout    Comprehension Returned demonstration;Verbalized understanding                 PT Long Term  Goals - 10/30/20 1715       PT LONG TERM GOAL #1   Title Patient will demonstrate she has regainde full shoulder ROM and function post operatively compared to baselines.    Time 8    Period Weeks    Status New    Target Date 12/25/20             Breast Clinic Goals - 10/30/20 1715       Patient will be able to verbalize understanding of pertinent lymphedema risk reduction practices relevant to her diagnosis specifically related to skin care.   Time 1    Period Days    Status Achieved      Patient will be able to return demonstrate and/or verbalize understanding of the post-op home exercise program related to regaining shoulder range of motion.   Time 1    Period Days    Status Achieved      Patient will be able to verbalize understanding of the importance of attending the postoperative After Breast Cancer Class for further lymphedema risk reduction education and therapeutic exercise.   Time 1    Period Days    Status Achieved                   Plan - 10/30/20 1713  Clinical Impression Statement Patient was diagnosed on 10/10/2020 with left grade II invasive ductal carcinoma breast cancer. It measures 9 mm and is located in the upper outer quadrant. It is ER/PR positive and HER2 negative with a Ki67 of 10%. She underwent a partial hysterectomy (ovaries still remain) in 04/2019. Her multidisciplainry medical team met prior to her assessments to determine a recommended treatment plan. She is planning to have a left lumpectomy and sentinel node biopsy followed by Oncotype testing, radiation, and anti-estrogen therapy. She will benefit from a post op PT reassessment to determine needs and from L-Dex screens every 3 months for 2 years to detect subclinical lymphedema.    Stability/Clinical Decision Making Stable/Uncomplicated    Clinical Decision Making Low    Rehab Potential Excellent    PT Frequency --   Eval and 1 f/u visit   PT Treatment/Interventions ADLs/Self Care  Home Management;Therapeutic exercise;Patient/family education    PT Next Visit Plan Will reassess 3-4 weeks post op    PT Home Exercise Plan Post op shoulder ROM HEP    Consulted and Agree with Plan of Care Patient;Family member/caregiver    Family Member Consulted husband             Patient will benefit from skilled therapeutic intervention in order to improve the following deficits and impairments:  Postural dysfunction, Decreased range of motion, Decreased knowledge of precautions, Impaired UE functional use, Pain  Visit Diagnosis: Malignant neoplasm of upper-outer quadrant of left breast in female, estrogen receptor positive ([facility]) - Plan: PT plan of care cert/re-cert  Abnormal posture - Plan: PT plan of care cert/re-cert   Patient will follow up at outpatient cancer rehab 3-4 weeks following surgery.  If the patient requires physical therapy at that time, a specific plan will be dictated and sent to the referring physician for approval. The patient was educated today on appropriate basic range of motion exercises to begin post operatively and the importance of attending the After Breast Cancer class following surgery.  Patient was educated today on lymphedema risk reduction practices as it pertains to recommendations that will benefit the patient immediately following surgery.  She verbalized good understanding.     Problem List Patient Active Problem List   Diagnosis Date Noted   Malignant neoplasm of upper-outer quadrant of left breast in female, estrogen receptor positive ([place]) 10/28/2020   Postoperative state 04/26/2019   Post-operative state 04/26/2019   Menorrhagia 03/21/2018   Annia Friendly, PT 10/30/20 5:18 PM   [facility] @ [place], [place], [address] Phone: [phone]   Fax:  [phone]  Name: PRAPTI GRUSSING MRN: [id number] Date of Birth: 1980/03/04

## 2020-10-30 NOTE — Assessment & Plan Note (Signed)
10/23/2020:Presented with new focal thickening and pain her outer left breast. Diagnostic mammogram and [place]: indeterminate mass in the left breast at 3:00. 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  Breast MRI: 1 prominent lymph node and 9 mm mass  Pathology and radiology counseling:Discussed with the patient, the details of pathology including the type of breast cancer,the clinical staging, the significance of ER, PR and HER-2/neu receptors and the implications for treatment. After reviewing the pathology in detail, we proceeded to discuss the different treatment options between surgery, radiation, chemotherapy, antiestrogen therapies.  Recommendations: 1. Breast conserving surgery followed by 2. Oncotype DX testing to determine if chemotherapy would be of any benefit followed by 3. Adjuvant radiation therapy followed by 4. Adjuvant antiestrogen therapy 5.  Genetic testing  Oncotype counseling: I discussed Oncotype DX test. I explained to the patient that this is a 21 gene panel to evaluate patient tumors DNA to calculate recurrence score. This would help determine whether patient has high risk or low risk breast cancer. She understands that if her tumor was found to be high risk, she would benefit from systemic chemotherapy. If low risk, no need of chemotherapy.  Return to clinic after surgery to discuss final pathology report and then determine if Oncotype DX testing will need to be sent.

## 2020-10-30 NOTE — Patient Instructions (Signed)

## 2020-10-30 NOTE — Addendum Note (Signed)
Encounter addended by: Kyung Rudd, MD on: 10/30/2020 3:17 PM  Actions taken: Edit attestation on clinical note

## 2020-10-31 ENCOUNTER — Encounter: Payer: Self-pay | Admitting: Genetic Counselor

## 2020-10-31 ENCOUNTER — Telehealth: Payer: Self-pay | Admitting: *Deleted

## 2020-10-31 NOTE — Telephone Encounter (Signed)
Spoke to pt concerning [place] from 10.12.22. Denies questions or concerns regarding dx or treatment care plan. Encourage pt to call with needs. Received verbal understanding.

## 2020-11-01 ENCOUNTER — Telehealth: Payer: Self-pay | Admitting: Hematology and Oncology

## 2020-11-01 ENCOUNTER — Other Ambulatory Visit: Payer: Self-pay | Admitting: General Surgery

## 2020-11-01 DIAGNOSIS — C50412 Malignant neoplasm of upper-outer quadrant of left female breast: Secondary | ICD-10-CM

## 2020-11-01 NOTE — Telephone Encounter (Signed)
Scheduled per 10/14 in basket, pt has been called and confirmed appt 

## 2020-11-05 ENCOUNTER — Ambulatory Visit: Payer: PRIVATE HEALTH INSURANCE | Admitting: Internal Medicine

## 2020-11-05 ENCOUNTER — Encounter: Payer: Self-pay | Admitting: *Deleted

## 2020-11-07 ENCOUNTER — Telehealth: Payer: Self-pay | Admitting: Genetic Counselor

## 2020-11-07 ENCOUNTER — Encounter: Payer: Self-pay | Admitting: Genetic Counselor

## 2020-11-07 DIAGNOSIS — Z1379 Encounter for other screening for genetic and chromosomal anomalies: Secondary | ICD-10-CM | POA: Insufficient documentation

## 2020-11-07 NOTE — Telephone Encounter (Signed)
Revealed negative genetic testing.  Discussed that we do not know why she has breast cancer. It could be familial, due to a different gene that we are not testing, or maybe our current technology may not be able to pick something up.  It will be important for her to keep in contact with genetics to keep up with whether additional testing may be needed.  Results of pan-cancer panel are pending.

## 2020-11-12 ENCOUNTER — Ambulatory Visit: Payer: Self-pay | Admitting: Genetic Counselor

## 2020-11-12 DIAGNOSIS — Z170 Estrogen receptor positive status [ER+]: Secondary | ICD-10-CM

## 2020-11-12 DIAGNOSIS — C50412 Malignant neoplasm of upper-outer quadrant of left female breast: Secondary | ICD-10-CM

## 2020-11-12 DIAGNOSIS — Z1379 Encounter for other screening for genetic and chromosomal anomalies: Secondary | ICD-10-CM

## 2020-11-12 NOTE — Progress Notes (Signed)
HPI:   Ms. Pepitone was previously seen in the [facility] clinic due to a personal and family history of cancer and concerns regarding a hereditary predisposition to cancer. Please refer to our prior cancer genetics clinic note for more information regarding our discussion, assessment and recommendations, at the time. Ms. Junkins recent genetic test results were disclosed to her, as were recommendations warranted by these results. These results and recommendations are discussed in more detail below.  CANCER HISTORY:  Oncology History  Malignant neoplasm of upper-outer quadrant of left breast in female, estrogen receptor positive ([place])  10/28/2020 Initial Diagnosis   Presented with new focal thickening and pain her outer left breast. Diagnostic mammogram and [place]: indeterminate mass in the left breast at 3:00. 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  Breast MRI: 1 prominent lymph node and 9 mm mass     11/06/2020 Genetic Testing   Negative hereditary cancer genetic testing: no pathogenic variants detected in [facility] BRCAPlus Panel and [facility] CancerNext-Expanded +RNAinsight Panel. The report dates are November 06, 2020 and November 10, 2020, respectively.   The BRCAplus panel offered by [employer] and includes sequencing and deletion/duplication analysis for the following 8 genes: ATM, BRCA1, BRCA2, CDH1, CHEK2, PALB2, PTEN, and TP53.  The CancerNext-Expanded gene panel offered by [facility] and includes sequencing, rearrangement, and RNA analysis for the following 77 genes: AIP, ALK, APC, ATM, AXIN2, BAP1, BARD1, BLM, BMPR1A, BRCA1, BRCA2, BRIP1, CDC73, CDH1, CDK4, CDKN1B, CDKN2A, CHEK2, CTNNA1, DICER1, FANCC, FH, FLCN, GALNT12, KIF1B, LZTR1, MAX, MEN1, MET, MLH1, MSH2, MSH3, MSH6, MUTYH, NBN, NF1, NF2, NTHL1, PALB2, PHOX2B, PMS2, POT1, PRKAR1A, PTCH1, PTEN, RAD51C, RAD51D, RB1, RECQL, RET, SDHA, SDHAF2, SDHB, SDHC, SDHD,  SMAD4, SMARCA4, SMARCB1, SMARCE1, STK11, SUFU, TMEM127, TP53, TSC1, TSC2, VHL and XRCC2 (sequencing and deletion/duplication); EGFR, EGLN1, HOXB13, KIT, MITF, PDGFRA, POLD1, and POLE (sequencing only); EPCAM and GREM1 (deletion/duplication only).      FAMILY HISTORY:  We obtained a detailed, 4-generation family history.  Significant diagnoses are listed below: Family History  Problem Relation Age of Onset   Thyroid cancer Maternal Aunt        papillary; dx 26s   Brain cancer Maternal Uncle 2       gliolblastoma   Rectal cancer Paternal Uncle        mets; dx 58s   Cancer Paternal Uncle        unknown type; dx 62s   Thyroid cancer Maternal Grandmother        follicular   Prostate cancer Maternal Grandfather        dx 68s   Melanoma Paternal Grandmother        mets; dx 47s      Ms. Fialkowski is unaware of previous family history of genetic testing for hereditary cancer risks. There is reported Ashkenazi Jewish ancestry in her paternal family.  She reported Jewish ancestry in her maternal grandmother but does not believe she was of [place] descent. There is no known consanguinity.  GENETIC TEST RESULTS:  The [facility] CancerNext-Expanded +RNAinsight Panel found no pathogenic mutations. The CancerNext-Expanded gene panel offered by [facility] and includes sequencing, rearrangement, and RNA analysis for the following 77 genes: AIP, ALK, APC, ATM, AXIN2, BAP1, BARD1, BLM, BMPR1A, BRCA1, BRCA2, BRIP1, CDC73, CDH1, CDK4, CDKN1B, CDKN2A, CHEK2, CTNNA1, DICER1, FANCC, FH, FLCN, GALNT12, KIF1B, LZTR1, MAX, MEN1, MET, MLH1, MSH2, MSH3, MSH6, MUTYH, NBN, NF1, NF2, NTHL1, PALB2, PHOX2B, PMS2, POT1, PRKAR1A, PTCH1, PTEN, RAD51C, RAD51D,  RB1, RECQL, RET, SDHA, SDHAF2, SDHB, SDHC, SDHD, SMAD4, SMARCA4, SMARCB1, SMARCE1, STK11, SUFU, TMEM127, TP53, TSC1, TSC2, VHL and XRCC2 (sequencing and deletion/duplication); EGFR, EGLN1, HOXB13, KIT, MITF, PDGFRA, POLD1, and POLE (sequencing only); EPCAM and GREM1  (deletion/duplication only).   The test report has been scanned into EPIC and is located under the Molecular Pathology section of the Results Review tab.  A portion of the result report is included below for reference. Genetic testing reported out on November 10, 2020.        Even though a pathogenic variant was not identified, possible explanations for the cancer in the family may include: There may be no hereditary risk for cancer in the family. The cancers in Ms. Russman and/or her family may be sporadic/familial or due to other genetic and environmental factors. There may be a gene mutation in one of these genes that current testing methods cannot detect but that chance is small. There could be another gene that has not yet been discovered, or that we have not yet tested, that is responsible for the cancer diagnoses in the family.  It is also possible there is a hereditary cause for the cancer in the family that Ms. Descoteaux did not inherit.   Therefore, it is important to remain in touch with cancer genetics in the future so that we can continue to offer Ms. Melichar the most up to date genetic testing.    ADDITIONAL GENETIC TESTING:  We discussed with Ms. Curlin that her genetic testing was fairly extensive.  If there are genes identified to increase cancer risk that can be analyzed in the future, we would be happy to discuss and coordinate this testing at that time.    CANCER SCREENING RECOMMENDATIONS:  Ms. Savard test result is considered negative (normal).  This means that we have not identified a hereditary cause for her personal history of cancer at this time.   An individual's cancer risk and medical management are not determined by genetic test results alone. Overall cancer risk assessment incorporates additional factors, including personal medical history, family history, and any available genetic information that may result in a personalized plan for cancer prevention and  surveillance. Therefore, it is recommended she continue to follow the cancer management and screening guidelines provided by her oncology and primary healthcare provider.  RECOMMENDATIONS FOR FAMILY MEMBERS:   Since she did not inherit a identifiable mutation in a cancer predisposition gene included on this panel, her children could not have inherited a known mutation from her in one of these genes. Individuals in this family might be at some increased risk of developing cancer, over the general population risk, due to the family history of cancer.  Individuals in the family should notify their providers of the family history of cancer. We recommend women in this family have a yearly mammogram beginning at age 80, or 68 years younger than the earliest onset of cancer, an annual clinical breast exam, and perform monthly breast self-exams.  Family members should have colonoscopies by at age 35, or earlier, as recommended by their providers.    FOLLOW-UP:  Lastly, we discussed with Ms. Sistare that cancer genetics is a rapidly advancing field and it is possible that new genetic tests will be appropriate for her and/or her family members in the future. We encouraged her to remain in contact with cancer genetics on an annual basis so we can update her personal and family histories and let her know of advances in cancer genetics that  may benefit this family.   Our contact number was provided. Ms. Dedrick questions were answered to her satisfaction, and she knows she is welcome to call us at anytime with additional questions or concerns.   [place] Fairclough M. Joette Catching, [place], [facility] Genetic Counselor [email or web address] (P) [phone]

## 2020-11-12 NOTE — Telephone Encounter (Signed)
Revealed negative genetic testing for pan-cancer panel.

## 2020-11-13 ENCOUNTER — Encounter: Payer: Self-pay | Admitting: General Practice

## 2020-11-13 ENCOUNTER — Ambulatory Visit: Payer: Self-pay | Admitting: Obstetrics & Gynecology

## 2020-11-13 NOTE — Progress Notes (Signed)
[facility] Note  Gabriela Evans by phone for [facility] follow-up support. She is doing a lot of internal work and family processing related to cancer diagnosis, mortality, survivor guilt, self-care and balance, and extended family relationships. She is also working on her PT exercises and walking in preparation for a strong recovery after surgery.  Provided empathic listening, emotional support, pastoral reflection, and affirmation of strengths (self-awareness, insight, personal reflective work, Clinical research associate, Social research officer, government). Reminded her of Breast Cancer Support Group, [facility] peer mentors, and [place] as ongoing resources available to her. We plan to follow up by phone in ca two weeks, and she knows that we can schedule a lengthier video visit whenever she would like, as well. 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

## 2020-11-18 ENCOUNTER — Other Ambulatory Visit: Payer: Self-pay | Admitting: *Deleted

## 2020-11-18 MED ORDER — PROCHLORPERAZINE MALEATE 10 MG PO TABS: 10.0000 mg | ORAL_TABLET | Freq: Four times a day (QID) | ORAL | 0 refills | Status: DC | PRN

## 2020-11-20 ENCOUNTER — Encounter (HOSPITAL_BASED_OUTPATIENT_CLINIC_OR_DEPARTMENT_OTHER): Payer: Self-pay | Admitting: General Surgery

## 2020-11-28 ENCOUNTER — Ambulatory Visit
Admission: RE | Admit: 2020-11-28 | Discharge: 2020-11-28 | Disposition: A | Discharge status: Home / Self Care | Payer: Managed Care, Other (non HMO) | Source: Ambulatory Visit | Attending: General Surgery | Admitting: General Surgery

## 2020-11-28 ENCOUNTER — Encounter: Payer: Self-pay | Admitting: Physical Therapy

## 2020-11-28 ENCOUNTER — Ambulatory Visit: Payer: Managed Care, Other (non HMO) | Admitting: Physical Therapy

## 2020-11-28 ENCOUNTER — Other Ambulatory Visit: Payer: Self-pay

## 2020-11-28 ENCOUNTER — Other Ambulatory Visit: Payer: Self-pay | Admitting: *Deleted

## 2020-11-28 DIAGNOSIS — C50412 Malignant neoplasm of upper-outer quadrant of left female breast: Secondary | ICD-10-CM

## 2020-11-28 DIAGNOSIS — R293 Abnormal posture: Secondary | ICD-10-CM

## 2020-11-28 DIAGNOSIS — Z170 Estrogen receptor positive status [ER+]: Secondary | ICD-10-CM | POA: Insufficient documentation

## 2020-11-28 DIAGNOSIS — R600 Localized edema: Secondary | ICD-10-CM | POA: Insufficient documentation

## 2020-11-28 DIAGNOSIS — M542 Cervicalgia: Secondary | ICD-10-CM

## 2020-11-28 DIAGNOSIS — Z79899 Other long term (current) drug therapy: Secondary | ICD-10-CM | POA: Diagnosis not present

## 2020-11-28 IMAGING — MG MM PLC BREAST LOC DEV 1ST LESION INC MAMMO GUIDE*L*
7 series · 7 of 7 positions shown · non-contrast
Comparison: Previous exam(s).

CLINICAL DATA: Recently diagnosed invasive ductal carcinoma and
DCIS in the 3 o'clock position of the left breast, marked a ribbon
shaped biopsy marker clip.

EXAM:
MAMMOGRAPHIC GUIDED RADIOACTIVE SEED LOCALIZATION OF THE LEFT BREAST

[L CC (1 of 2)]
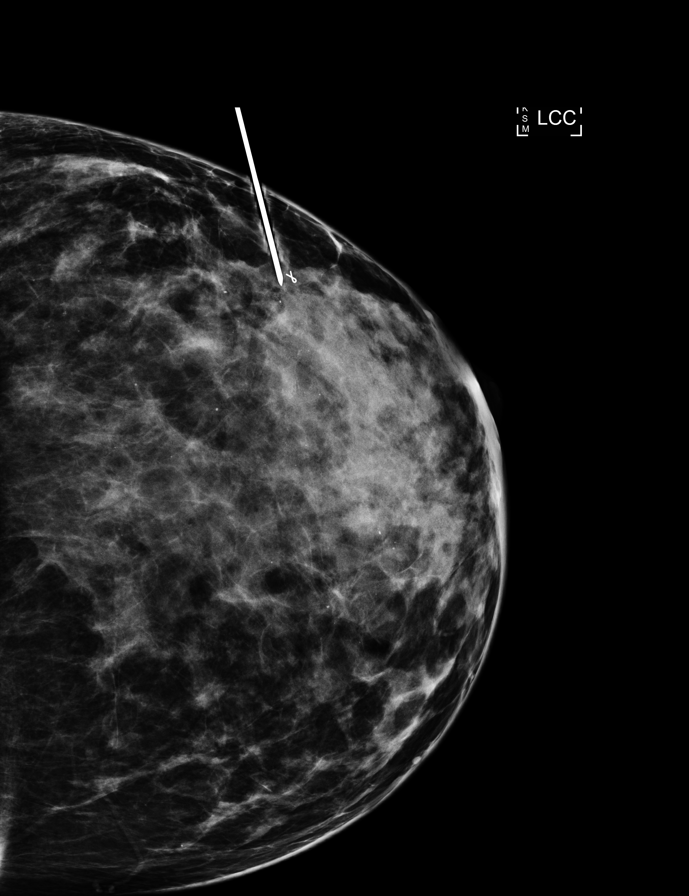

[L LM (1 of 5)]
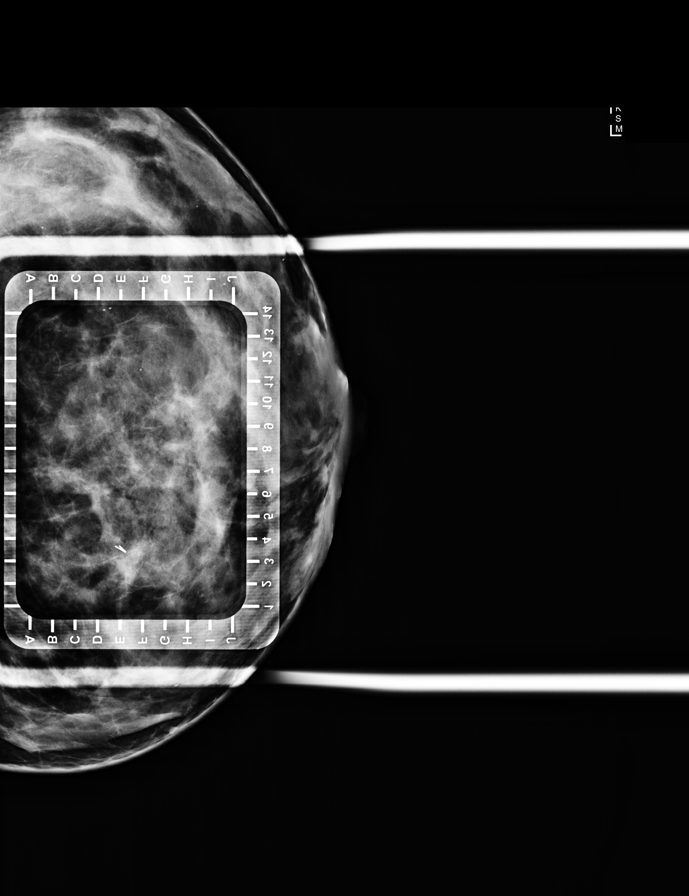

[L LM (2 of 5)]
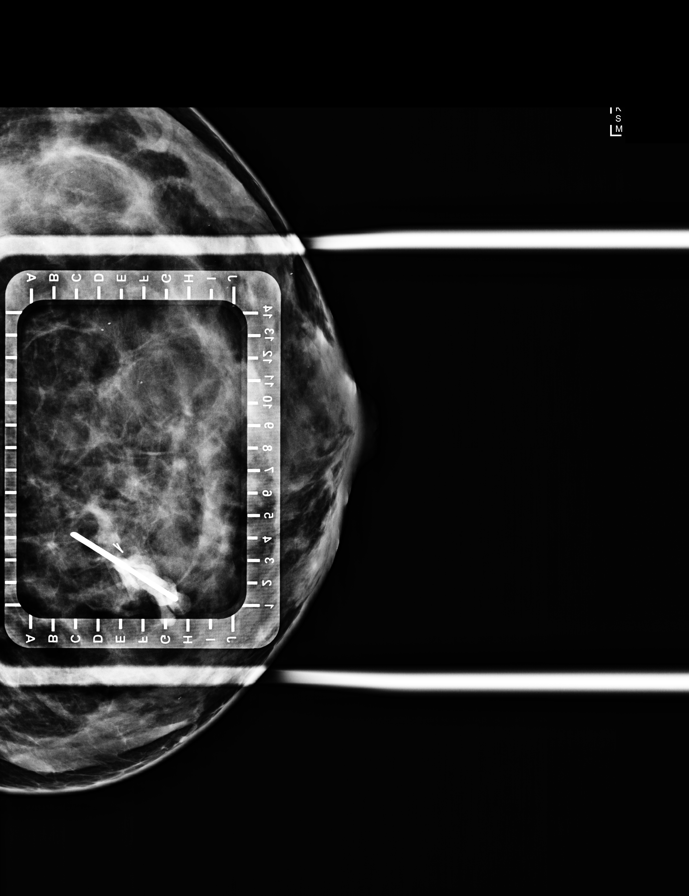

[L LM (3 of 5)]
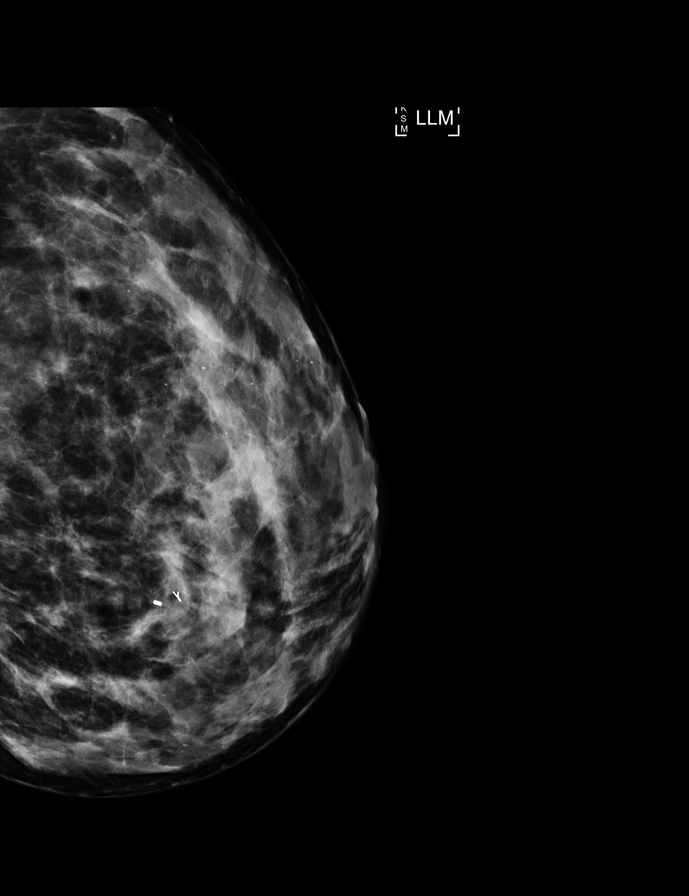

[L LM (4 of 5)]
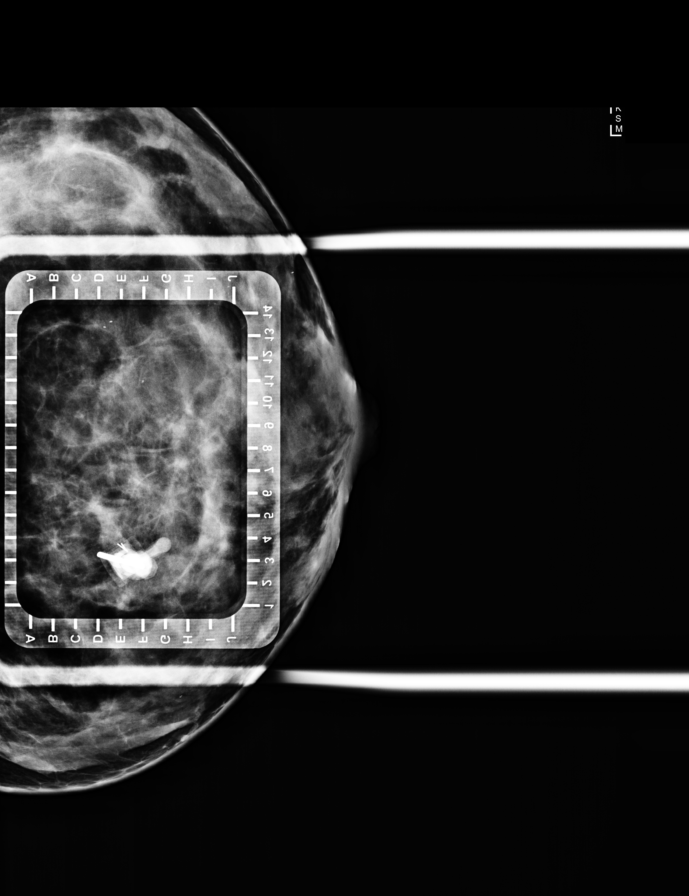

[L LM (5 of 5)]
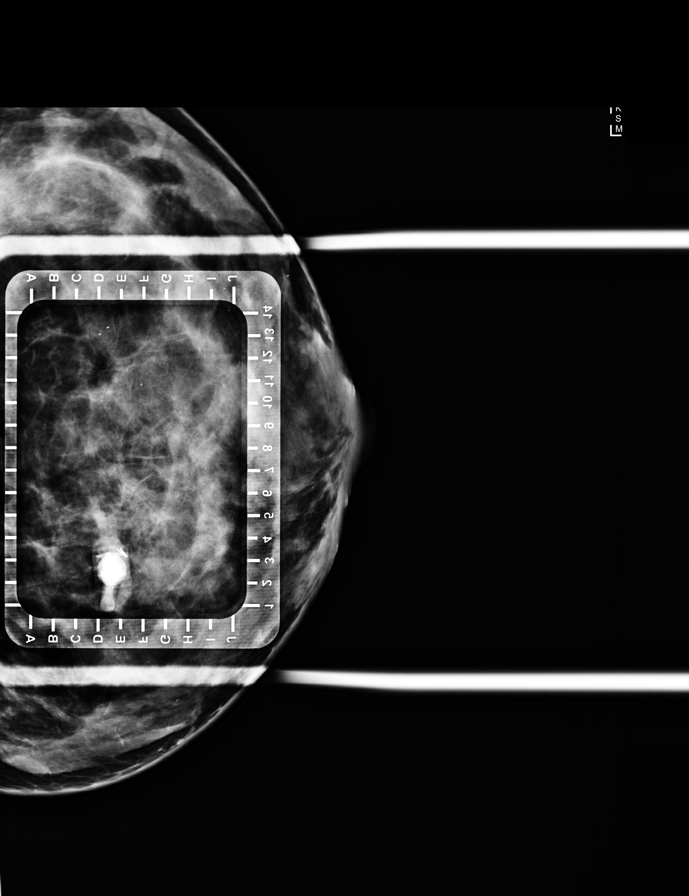

[L CC (2 of 2)]
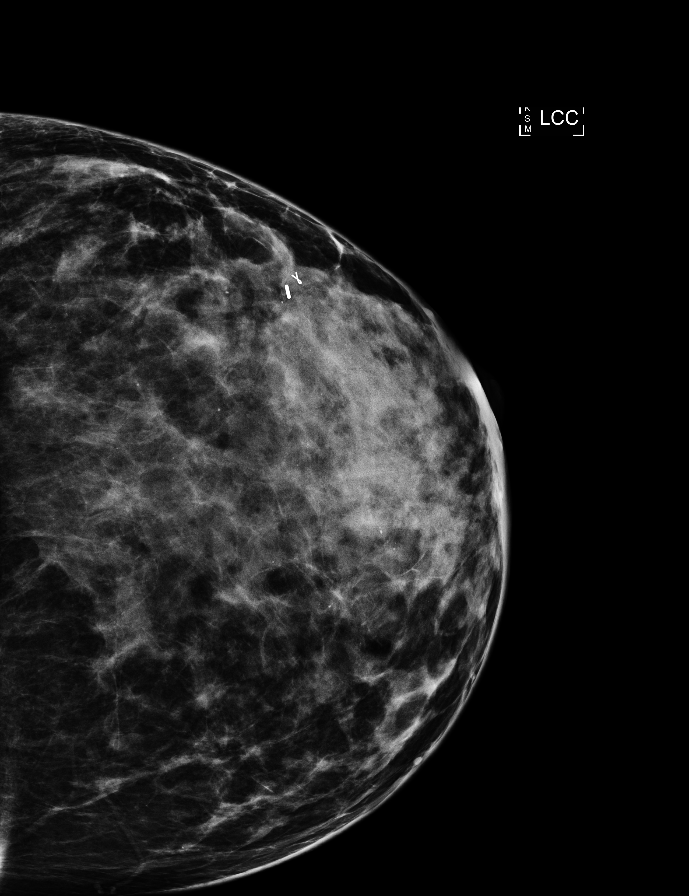

[7 of 7 positions shown; findings below may reference images not displayed]

FINDINGS: Patient presents for radioactive seed localization prior to left
lumpectomy. I met with the patient and we discussed the procedure of
seed localization including benefits and alternatives. We discussed
the high likelihood of a successful procedure. We discussed the
risks of the procedure including infection, bleeding, tissue injury
and further surgery. We discussed the low dose of radioactivity
involved in the procedure. Informed, written consent was given.

The usual time-out protocol was performed immediately prior to the
procedure.

Using mammographic guidance, sterile technique, 1% lidocaine and an
[K6] radioactive seed, the recently placed ribbon shaped biopsy
marker clip in the outer left breast was localized using a lateral
approach. The follow-up mammogram images confirm the seed in the
expected location and were marked for Dr. HALVERSON.

Follow-up survey of the patient confirms presence of the radioactive
seed.

Order number of [K6] seed:  [PHONE_NUMBER].

Total activity:  0.260 mCi reference Date: [DATE]

The patient tolerated the procedure well and was released from the
[REDACTED]. She was given instructions regarding seed removal.
IMPRESSION: Radioactive seed localization left breast. No apparent
complications.

## 2020-11-28 NOTE — Therapy (Signed)
[facility] @ [address] [place], [place], [address] Phone: [phone]   Fax:  [phone]  Physical Therapy Treatment  Patient Details  Name: Gabriela Evans MRN: [id number] Date of Birth: [birth year] Referring Provider (PT): Dr. Autumn Messing   Encounter Date: 11/28/2020   PT End of Session - 11/28/20 1256     Visit Number 2    Number of Visits 3    Date for PT Re-Evaluation 12/25/20    PT Start Time 0950    PT Stop Time 1100    PT Time Calculation (min) 70 min    Activity Tolerance Patient tolerated treatment well    Behavior During Therapy [facility] for tasks assessed/performed             Past Medical History:  Diagnosis Date   Anemia    Breast cancer ([place])    Complication of anesthesia    nausea   Kidney infection    Other abnormal glucose    Gestational diabetes with both children   Pneumonia    01-2019   PONV (postoperative nausea and vomiting)     Past Surgical History:  Procedure Laterality Date   CESAREAN SECTION     X2   IUD inserted in [place]  2009   remove perforated IUD   ROBOTIC ASSISTED LAPAROSCOPIC LYSIS OF ADHESION N/A 04/26/2019   Procedure: XI ROBOTIC ASSISTED LAPAROSCOPIC LYSIS OF ADHESION;  Surgeon: [name], MD;  Location: [facility];  Service: Gynecology;  Laterality: N/A;   ROBOTIC ASSISTED TOTAL HYSTERECTOMY WITH BILATERAL SALPINGO OOPHERECTOMY Bilateral 04/26/2019   Procedure: XI ROBOTIC ASSISTED TOTAL LAPAROSCOPIC HYSTERECTOMY WITH BILATERAL SALPINGECTOMY;  Surgeon: [name], MD;  Location: [facility];  Service: Gynecology;  Laterality: Bilateral;  requests 8:30 OR start in [place] block time requests 2 hours   WISDOM TOOTH EXTRACTION      There were no vitals filed for this visit.   Subjective Assessment - 11/28/20 0956     Subjective Patient reports she began having left intrascapular pain radiating into  her neck which started around 11/11/2020 when doing her exercises she was given for post op. She is scheduled for her left lumpectomy and sentinel node biopsy 11/29/2020.    Pertinent History Patient was diagnosed on 10/10/2020 with left grade II invasive ductal carcinoma breast cancer. It measures 9 mm and is located in the upper outer quadrant. It is ER/PR positive and HER2 negative with a Ki67 of 10%. She underwent a partial hysterectomy (ovaries still remain) in 04/2019.    Patient Stated Goals Decrease neck pain    Currently in Pain? Yes    Pain Score 3     Pain Location Neck    Pain Orientation Left    Pain Descriptors / Indicators Tightness    Pain Onset 1 to 4 weeks ago    Pain Frequency Intermittent    Aggravating Factors  Turning to the left or moving arm suddenly    Pain Relieving Factors Unknown                [facility] PT Assessment - 11/28/20 0001       Assessment   Medical Diagnosis Left breast cancer and left neck pain    Referring Provider (PT) Dr. Autumn Messing    Onset Date/Surgical Date 11/11/20   For neck pain   Hand Dominance Right    Prior Therapy Baselines  Precautions   Precautions Other (comment)    Precaution Comments active cancer      Restrictions   Weight Bearing Restrictions No      Balance Screen   Has the patient fallen in the past 6 months No    Has the patient had a decrease in activity level because of a fear of falling?  No    Is the patient reluctant to leave their home because of a fear of falling?  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    Leisure She is walking 3-4x/week for 40 min       Cognition   Overall Cognitive Status Within Functional Limits for tasks assessed      Posture/Postural Control   Posture/Postural Control Postural limitations    Postural Limitations Rounded Shoulders;Forward head      ROM / Strength   AROM / PROM / Strength AROM      AROM   AROM Assessment Site Cervical    Cervical Flexion 50% limited    Cervical Extension 25% limited    Cervical - Right Side Bend 25% limited    Cervical - Left Side Bend 25% limited    Cervical - Right Rotation 25% limited    Cervical - Left Rotation 50% limited      Strength   Overall Strength Comments Left UE 5/5      Palpation   Palpation comment Palpable tightness present left intrascapular region. Assessed vertebrae and she does not appear to be rotated.      Special Tests    Special Tests Cervical    Cervical Tests Dictraction      Distraction Test   Findngs Negative    Comment No change in pain with cervical distraction                           [facility] Adult PT Treatment/Exercise - 11/28/20 0001       Modalities   Modalities Moist Heat      Moist Heat Therapy   Number Minutes Moist Heat 15 Minutes    Moist Heat Location Other (comment)   Upper back / scapular region     Manual Therapy   Manual Therapy Scapular mobilization;Passive ROM;Manual Traction;Soft tissue mobilization    Manual therapy comments Suboccipital release in supine.    Soft tissue mobilization To bilateral upper traps, intrascapular regions, and rhomboids in supine and in right sidelying with Palmer's lotion.    Scapular Mobilization Attempted left scpular mobs in right sidelying but PT unable to get under the medial border of the scapula due to tightness.    Passive ROM PROM to cervical spine in supine - sidebending and rotation to pt tolerance    Manual Traction Gentle manual cervical traction supine with hold/release a few times but pt reported no change in symptoms.                     PT  Education - 11/28/20 1254     Education Details Encouraged pt to do  gentle cervical rotation and sidebending stretches. She is having surgery tomorrow so we will readdress post operatively    Person(s) Educated Patient    Methods Explanation;Demonstration    Comprehension Verbalized understanding;Returned demonstration                 PT Long Term Goals - 11/28/20 1330       PT LONG TERM GOAL #1   Title Patient will demonstrate she has regained full shoulder ROM and function post operatively compared to baselines.    Time 4    Period Weeks    Status On-going    Target Date 12/26/20      PT LONG TERM GOAL #2   Title Patient will report >/= 50% less pain and tightness in her neck and scapular region to tolerate daily tasks with less difficulty.    Time 4    Period Weeks    Status New    Target Date 12/26/20      PT LONG TERM GOAL #3   Title Patient will increase cervical AROM to be WNL for improved overall movement for tunring head while driving and doing daily tasks.    Time 4    Period Weeks    Status New    Target Date 12/26/20                   Plan - 11/28/20 1304     Clinical Impression Statement Patient was seen today (1 day prior to her breast cancer surgery) due to a new symptoms of neck and scapular pain which is interfering with her life and she wanted to try to improve her pain prior ot her surgery tomorrow. Her pain began around 11/11/2020 for unknown reasons but believes the post op exercises she started pre-operatively might have flared her up. She has limited cervical AROM, palpble tightness present in left intrascapular region, and neural tension in left UE. She will benefit from PT to further address this post operatively if her pain persists. She reported mild vertigo during manual treatment but it resolved quickly.    Stability/Clinical Decision Making Stable/Uncomplicated    Clinical Decision Making Low    Rehab Potential Excellent    PT  Frequency 2x / week    PT Duration 4 weeks    PT Treatment/Interventions ADLs/Self Care Home Management;Therapeutic exercise;Patient/family education;Dry needling;Passive range of motion;Manual techniques;Scar mobilization;Moist Heat   Moist heat prior to her risk for lymphedema   PT Next Visit Plan Will reassess 3-4 weeks post op    Consulted and Agree with Plan of Care Patient             Patient will benefit from skilled therapeutic intervention in order to improve the following deficits and impairments:  Postural dysfunction, Decreased range of motion, Decreased knowledge of precautions, Impaired UE functional use, Pain  Visit Diagnosis: Malignant neoplasm of upper-outer quadrant of left breast in female, estrogen receptor positive ([place]) - Plan: PT plan of care cert/re-cert  Abnormal posture - Plan: PT plan of care cert/re-cert  Cervicalgia - Plan: PT plan of care cert/re-cert     Problem List Patient Active Problem List   Diagnosis Date Noted   Genetic testing 11/07/2020   Malignant neoplasm of upper-outer quadrant of left breast in female, estrogen receptor positive ([place]) 10/28/2020   Postoperative state 04/26/2019   Post-operative state 04/26/2019   Menorrhagia 03/21/2018   Annia Friendly, PT 11/28/20 1:38 PM   [facility] @ [place] [place], [place], [address] Phone: [phone]   Fax:  [phone]  Name: Gabriela Evans MRN: [id number] Date of Birth: 12-21-1980

## 2020-11-28 NOTE — Progress Notes (Signed)

## 2020-11-29 ENCOUNTER — Encounter (HOSPITAL_BASED_OUTPATIENT_CLINIC_OR_DEPARTMENT_OTHER)
Admission: RE | Disposition: A | Discharge status: Home / Self Care | Payer: Self-pay | Source: Home / Self Care | Attending: General Surgery

## 2020-11-29 ENCOUNTER — Ambulatory Visit
Admission: RE | Admit: 2020-11-29 | Discharge: 2020-11-29 | Disposition: A | Discharge status: Home / Self Care | Payer: Managed Care, Other (non HMO) | Source: Ambulatory Visit | Attending: General Surgery | Admitting: General Surgery

## 2020-11-29 ENCOUNTER — Ambulatory Visit (HOSPITAL_BASED_OUTPATIENT_CLINIC_OR_DEPARTMENT_OTHER)
Admission: RE | Admit: 2020-11-29 | Discharge: 2020-11-29 | Disposition: A | Discharge status: Home / Self Care | Payer: Managed Care, Other (non HMO) | Attending: General Surgery | Admitting: General Surgery

## 2020-11-29 ENCOUNTER — Ambulatory Visit (HOSPITAL_BASED_OUTPATIENT_CLINIC_OR_DEPARTMENT_OTHER): Payer: Managed Care, Other (non HMO) | Admitting: Anesthesiology

## 2020-11-29 ENCOUNTER — Other Ambulatory Visit: Payer: Self-pay

## 2020-11-29 ENCOUNTER — Encounter (HOSPITAL_BASED_OUTPATIENT_CLINIC_OR_DEPARTMENT_OTHER): Payer: Self-pay | Admitting: General Surgery

## 2020-11-29 DIAGNOSIS — Z170 Estrogen receptor positive status [ER+]: Secondary | ICD-10-CM | POA: Diagnosis not present

## 2020-11-29 DIAGNOSIS — E119 Type 2 diabetes mellitus without complications: Secondary | ICD-10-CM | POA: Diagnosis not present

## 2020-11-29 DIAGNOSIS — C50412 Malignant neoplasm of upper-outer quadrant of left female breast: Secondary | ICD-10-CM

## 2020-11-29 HISTORY — PX: BREAST LUMPECTOMY WITH RADIOACTIVE SEED AND SENTINEL LYMPH NODE BIOPSY: SHX6550

## 2020-11-29 HISTORY — DX: Gestational diabetes mellitus in pregnancy, unspecified control: O24.419

## 2020-11-29 IMAGING — MG MM BREAST SURGICAL SPECIMEN
1 series · 2 of 2 positions shown · non-contrast
Comparison: Previous exam(s).

CLINICAL DATA: Specimen radiograph status post left breast
lumpectomy.

EXAM:
SPECIMEN RADIOGRAPH OF THE LEFT BREAST

[Series 1: L · left · 0.07mm/px · 2 of 2 slices shown]
[im 1/2]
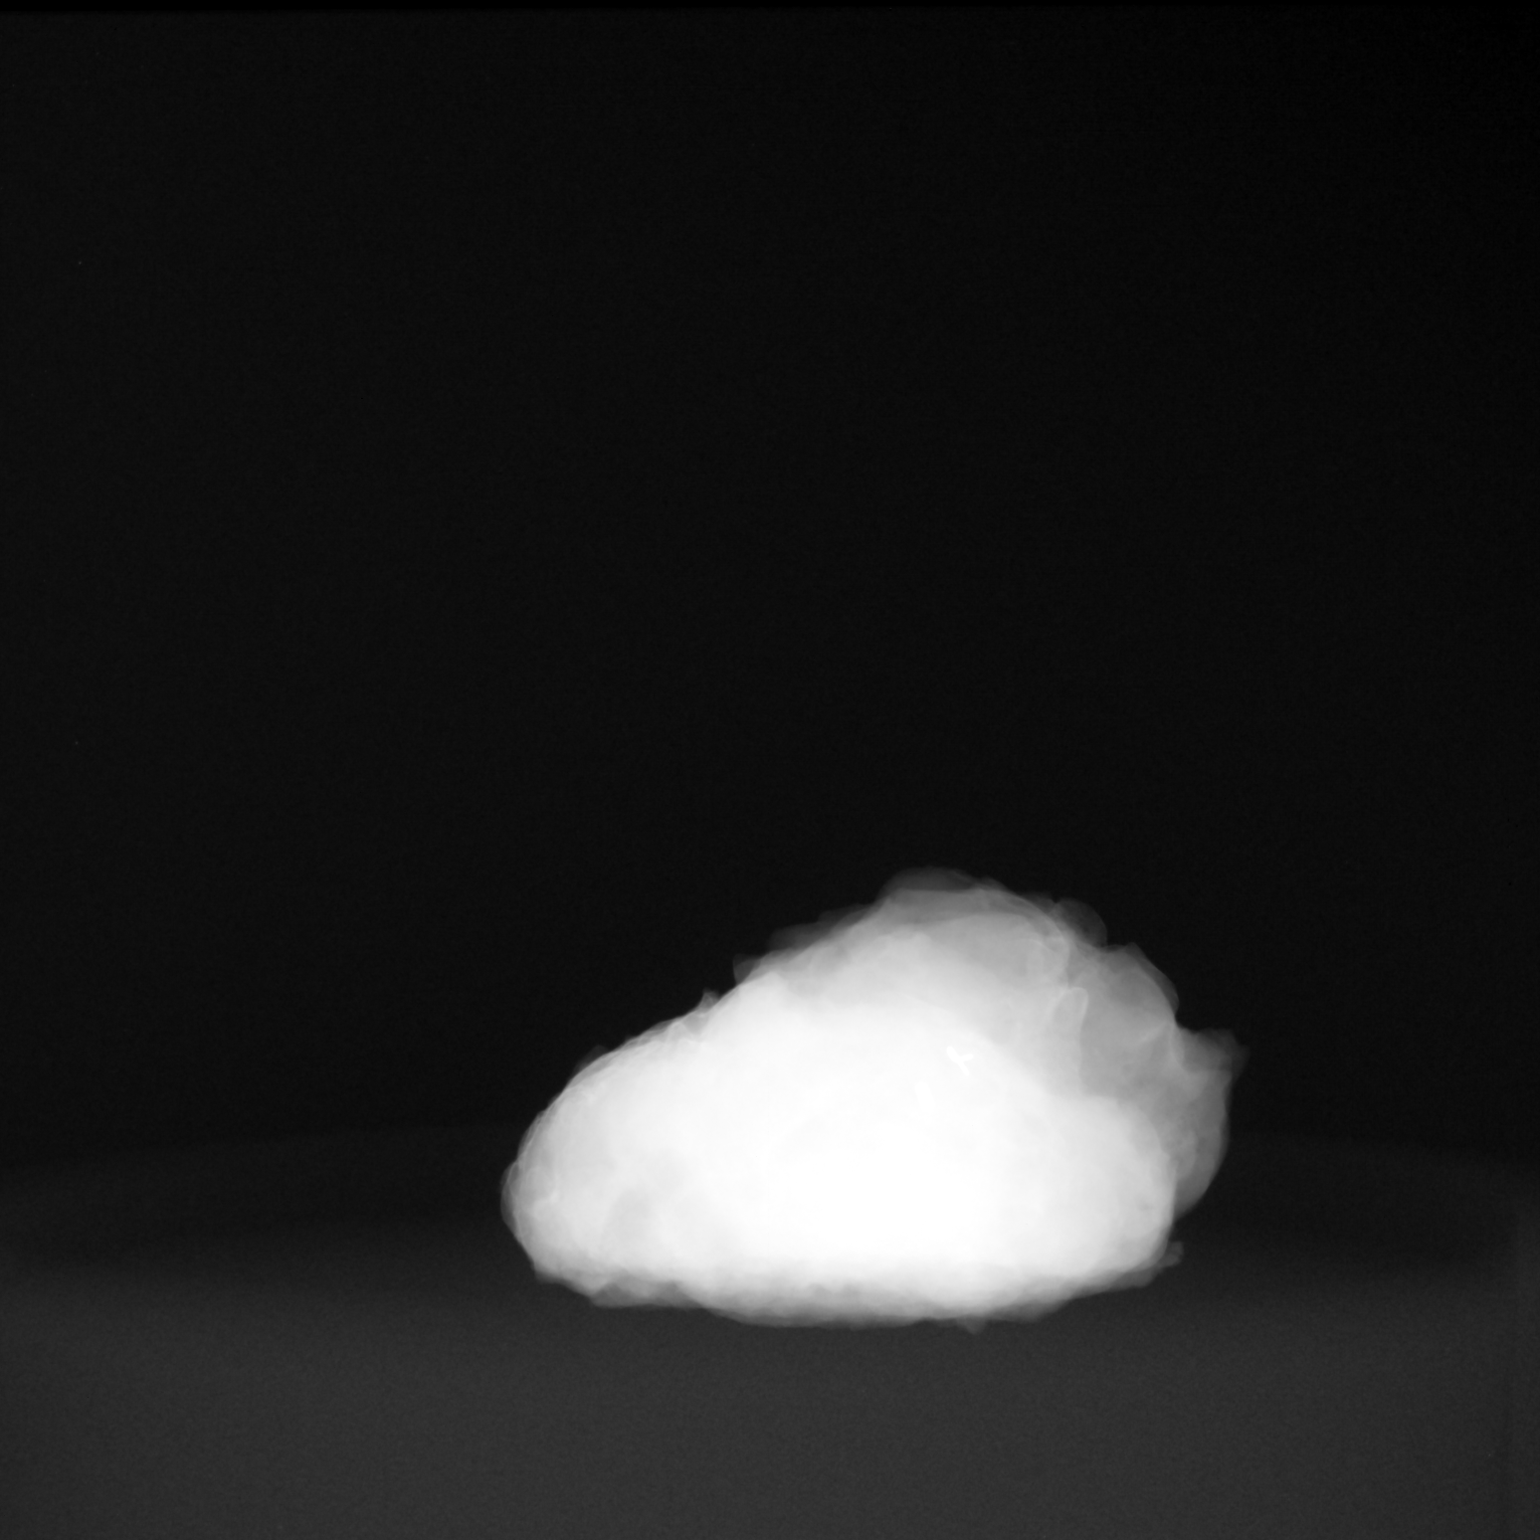
[im 2/2]
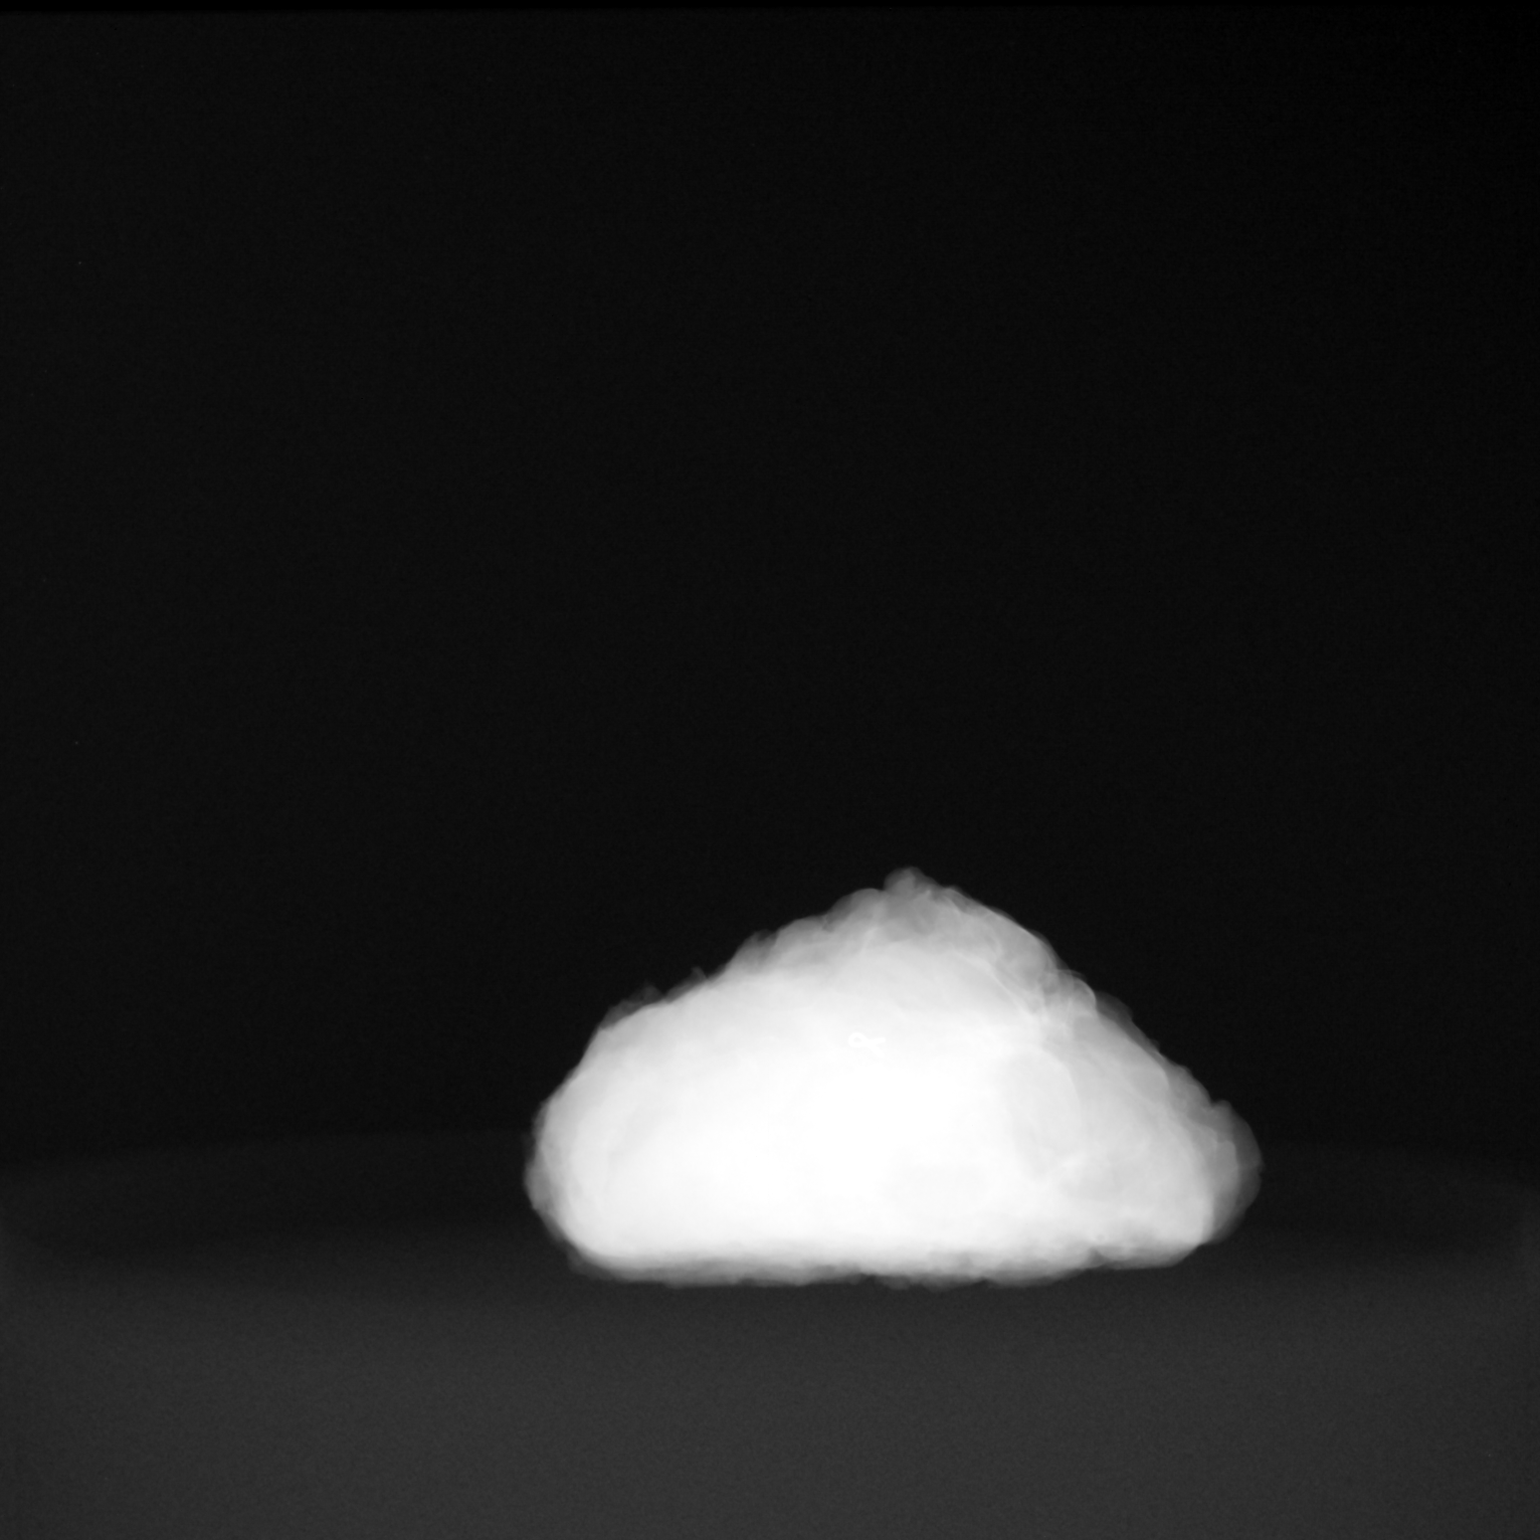

[2 of 2 positions shown; findings below may reference images not displayed]

FINDINGS: Status post excision of the left breast. The radioactive seed and
biopsy marker clip are present and completely intact within the
specimen. These findings were communicated with the OR at [DATE]
a.m.
IMPRESSION: Specimen radiograph of the left breast.

## 2020-11-29 SURGERY — BREAST LUMPECTOMY WITH RADIOACTIVE SEED AND SENTINEL LYMPH NODE BIOPSY
Anesthesia: Regional | Site: Breast | Laterality: Left

## 2020-11-29 MED ORDER — ONDANSETRON HCL 4 MG/2ML IJ SOLN
INTRAMUSCULAR | Status: AC
  Filled 2020-11-29: qty 2

## 2020-11-29 MED ORDER — PROPOFOL 10 MG/ML IV BOLUS
INTRAVENOUS | Status: AC
  Filled 2020-11-29: qty 20

## 2020-11-29 MED ORDER — TRAMADOL HCL 50 MG PO TABS
50.0000 mg | ORAL_TABLET | Freq: Once | ORAL | Status: AC
  Administered 2020-11-29: 50 mg via ORAL

## 2020-11-29 MED ORDER — 0.9 % SODIUM CHLORIDE (POUR BTL) OPTIME
TOPICAL | Status: DC | PRN
  Administered 2020-11-29: 200 mL

## 2020-11-29 MED ORDER — FENTANYL CITRATE (PF) 100 MCG/2ML IJ SOLN
25.0000 ug | INTRAMUSCULAR | Status: DC | PRN
  Administered 2020-11-29 (×2): 50 ug via INTRAVENOUS

## 2020-11-29 MED ORDER — FENTANYL CITRATE (PF) 100 MCG/2ML IJ SOLN
INTRAMUSCULAR | Status: AC
  Filled 2020-11-29: qty 2

## 2020-11-29 MED ORDER — CEFAZOLIN SODIUM-DEXTROSE 2-4 GM/100ML-% IV SOLN
INTRAVENOUS | Status: AC
  Filled 2020-11-29: qty 100

## 2020-11-29 MED ORDER — CEFAZOLIN SODIUM-DEXTROSE 2-3 GM-%(50ML) IV SOLR
INTRAVENOUS | Status: DC | PRN
  Administered 2020-11-29: 2 g via INTRAVENOUS

## 2020-11-29 MED ORDER — CELECOXIB 200 MG PO CAPS: 200.0000 mg | ORAL_CAPSULE | ORAL | Status: DC

## 2020-11-29 MED ORDER — MAGTRACE LYMPHATIC TRACER
INTRAMUSCULAR | Status: DC | PRN
  Administered 2020-11-29: 2 mL via INTRAMUSCULAR

## 2020-11-29 MED ORDER — ACETAMINOPHEN 10 MG/ML IV SOLN
INTRAVENOUS | Status: AC
  Filled 2020-11-29: qty 100

## 2020-11-29 MED ORDER — KETAMINE HCL 100 MG/ML IJ SOLN
INTRAMUSCULAR | Status: AC
  Filled 2020-11-29: qty 1

## 2020-11-29 MED ORDER — PROPOFOL 10 MG/ML IV BOLUS
INTRAVENOUS | Status: DC | PRN
  Administered 2020-11-29: 180 mg via INTRAVENOUS
  Administered 2020-11-29: 20 mg via INTRAVENOUS

## 2020-11-29 MED ORDER — GLYCOPYRROLATE 0.2 MG/ML IJ SOLN
INTRAMUSCULAR | Status: DC | PRN
  Administered 2020-11-29: .2 mg via INTRAVENOUS

## 2020-11-29 MED ORDER — LACTATED RINGERS IV SOLN: INTRAVENOUS | Status: DC

## 2020-11-29 MED ORDER — KETOROLAC TROMETHAMINE 30 MG/ML IJ SOLN
30.0000 mg | Freq: Once | INTRAMUSCULAR | Status: AC
  Administered 2020-11-29: 30 mg via INTRAVENOUS

## 2020-11-29 MED ORDER — GABAPENTIN 300 MG PO CAPS
300.0000 mg | ORAL_CAPSULE | ORAL | Status: AC
  Administered 2020-11-29: 300 mg via ORAL

## 2020-11-29 MED ORDER — CELECOXIB 200 MG PO CAPS
ORAL_CAPSULE | ORAL | Status: AC
  Filled 2020-11-29: qty 1

## 2020-11-29 MED ORDER — TRAMADOL HCL 50 MG PO TABS
ORAL_TABLET | ORAL | Status: AC
  Filled 2020-11-29: qty 1

## 2020-11-29 MED ORDER — FENTANYL CITRATE (PF) 100 MCG/2ML IJ SOLN
50.0000 ug | Freq: Once | INTRAMUSCULAR | Status: AC
  Administered 2020-11-29: 50 ug via INTRAVENOUS

## 2020-11-29 MED ORDER — CEFAZOLIN SODIUM-DEXTROSE 2-4 GM/100ML-% IV SOLN
2.0000 g | INTRAVENOUS | Status: AC
  Administered 2020-11-29: 2 g via INTRAVENOUS

## 2020-11-29 MED ORDER — MIDAZOLAM HCL 2 MG/2ML IJ SOLN
INTRAMUSCULAR | Status: AC
  Filled 2020-11-29: qty 2

## 2020-11-29 MED ORDER — MIDAZOLAM HCL 2 MG/2ML IJ SOLN
2.0000 mg | Freq: Once | INTRAMUSCULAR | Status: AC
  Administered 2020-11-29: 2 mg via INTRAVENOUS

## 2020-11-29 MED ORDER — LIDOCAINE 2% (20 MG/ML) 5 ML SYRINGE
INTRAMUSCULAR | Status: AC
  Filled 2020-11-29: qty 5

## 2020-11-29 MED ORDER — KETOROLAC TROMETHAMINE 30 MG/ML IJ SOLN
INTRAMUSCULAR | Status: AC
  Filled 2020-11-29: qty 1

## 2020-11-29 MED ORDER — BUPIVACAINE-EPINEPHRINE 0.25% -1:200000 IJ SOLN
INTRAMUSCULAR | Status: DC | PRN
  Administered 2020-11-29: 20 mL

## 2020-11-29 MED ORDER — CHLORHEXIDINE GLUCONATE CLOTH 2 % EX PADS: 6.0000 | MEDICATED_PAD | Freq: Once | CUTANEOUS | Status: DC

## 2020-11-29 MED ORDER — ONDANSETRON HCL 4 MG/2ML IJ SOLN
INTRAMUSCULAR | Status: DC | PRN
  Administered 2020-11-29: 4 mg via INTRAVENOUS

## 2020-11-29 MED ORDER — FENTANYL CITRATE (PF) 100 MCG/2ML IJ SOLN
INTRAMUSCULAR | Status: DC | PRN
  Administered 2020-11-29 (×4): 25 ug via INTRAVENOUS

## 2020-11-29 MED ORDER — LIDOCAINE HCL (CARDIAC) PF 100 MG/5ML IV SOSY
PREFILLED_SYRINGE | INTRAVENOUS | Status: DC | PRN
  Administered 2020-11-29: 60 mg via INTRATRACHEAL

## 2020-11-29 MED ORDER — GABAPENTIN 300 MG PO CAPS
ORAL_CAPSULE | ORAL | Status: AC
  Filled 2020-11-29: qty 1

## 2020-11-29 MED ORDER — ACETAMINOPHEN 10 MG/ML IV SOLN
1000.0000 mg | Freq: Once | INTRAVENOUS | Status: AC
  Administered 2020-11-29: 1000 mg via INTRAVENOUS

## 2020-11-29 MED ORDER — PROPOFOL 500 MG/50ML IV EMUL
INTRAVENOUS | Status: DC | PRN
  Administered 2020-11-29: 200 ug/kg/min via INTRAVENOUS
  Administered 2020-11-29: 250 ug/kg/min via INTRAVENOUS

## 2020-11-29 MED ORDER — KETAMINE HCL 10 MG/ML IJ SOLN
INTRAMUSCULAR | Status: DC | PRN
  Administered 2020-11-29: 50 mg via INTRAVENOUS

## 2020-11-29 MED ORDER — BUPIVACAINE-EPINEPHRINE (PF) 0.5% -1:200000 IJ SOLN
INTRAMUSCULAR | Status: DC | PRN
  Administered 2020-11-29: 30 mL via PERINEURAL

## 2020-11-29 MED ORDER — TRAMADOL HCL 50 MG PO TABS: 50.0000 mg | ORAL_TABLET | Freq: Four times a day (QID) | ORAL | 0 refills | Status: DC | PRN

## 2020-11-29 MED ORDER — DEXAMETHASONE SODIUM PHOSPHATE 10 MG/ML IJ SOLN
INTRAMUSCULAR | Status: DC | PRN
  Administered 2020-11-29: 5 mg via INTRAVENOUS

## 2020-11-29 SURGICAL SUPPLY — 47 items
ADH SKN CLS APL DERMABOND .7 (GAUZE/BANDAGES/DRESSINGS) ×1
APL PRP STRL LF DISP 70% ISPRP (MISCELLANEOUS) ×1
APPLIER CLIP 9.375 MED OPEN (MISCELLANEOUS) ×2
APR CLP MED 9.3 20 MLT OPN (MISCELLANEOUS) ×1
BLADE SURG 15 STRL LF DISP TIS (BLADE) ×1 IMPLANT
BLADE SURG 15 STRL SS (BLADE) ×2
CANISTER SUC SOCK COL 7IN (MISCELLANEOUS) IMPLANT
CANISTER SUCT 1200ML W/VALVE (MISCELLANEOUS) ×2 IMPLANT
CHLORAPREP W/TINT 26 (MISCELLANEOUS) ×2 IMPLANT
CLIP APPLIE 9.375 MED OPEN (MISCELLANEOUS) ×1 IMPLANT
COVER BACK TABLE 60X90IN (DRAPES) ×2 IMPLANT
COVER MAYO STAND STRL (DRAPES) ×2 IMPLANT
COVER PROBE W GEL 5X96 (DRAPES) ×2 IMPLANT
DECANTER SPIKE VIAL GLASS SM (MISCELLANEOUS) IMPLANT
DERMABOND ADVANCED (GAUZE/BANDAGES/DRESSINGS) ×1
DERMABOND ADVANCED .7 DNX12 (GAUZE/BANDAGES/DRESSINGS) ×1 IMPLANT
DRAPE LAPAROSCOPIC ABDOMINAL (DRAPES) ×2 IMPLANT
DRAPE UTILITY XL STRL (DRAPES) ×2 IMPLANT
ELECT COATED BLADE 2.86 ST (ELECTRODE) ×2 IMPLANT
ELECT REM PT RETURN 9FT ADLT (ELECTROSURGICAL) ×2
ELECTRODE REM PT RTRN 9FT ADLT (ELECTROSURGICAL) ×1 IMPLANT
GLOVE SURG ENC MOIS LTX SZ7.5 (GLOVE) ×2 IMPLANT
GLOVE SURG POLYISO LF SZ6.5 (GLOVE) ×2 IMPLANT
GLOVE SURG UNDER POLY LF SZ6.5 (GLOVE) ×2 IMPLANT
GLOVE SURG UNDER POLY LF SZ7 (GLOVE) ×2 IMPLANT
GOWN STRL REUS W/ TWL LRG LVL3 (GOWN DISPOSABLE) ×2 IMPLANT
GOWN STRL REUS W/TWL LRG LVL3 (GOWN DISPOSABLE) ×4
ILLUMINATOR WAVEGUIDE N/F (MISCELLANEOUS) IMPLANT
KIT MARKER MARGIN INK (KITS) ×2 IMPLANT
LIGHT WAVEGUIDE WIDE FLAT (MISCELLANEOUS) IMPLANT
NDL SAFETY ECLIPSE 18X1.5 (NEEDLE) IMPLANT
NEEDLE HYPO 18GX1.5 SHARP (NEEDLE)
NEEDLE HYPO 25X1 1.5 SAFETY (NEEDLE) ×4 IMPLANT
NS IRRIG 1000ML POUR BTL (IV SOLUTION) ×2 IMPLANT
PACK BASIN DAY SURGERY FS (CUSTOM PROCEDURE TRAY) ×2 IMPLANT
PENCIL SMOKE EVACUATOR (MISCELLANEOUS) ×2 IMPLANT
SLEEVE SCD COMPRESS KNEE MED (STOCKING) ×2 IMPLANT
SPONGE T-LAP 18X18 ~~LOC~~+RFID (SPONGE) ×2 IMPLANT
SUT MON AB 4-0 PC3 18 (SUTURE) ×4 IMPLANT
SUT SILK 2 0 SH (SUTURE) IMPLANT
SUT VICRYL 3-0 CR8 SH (SUTURE) ×2 IMPLANT
SYR CONTROL 10ML LL (SYRINGE) ×2 IMPLANT
TOWEL GREEN STERILE FF (TOWEL DISPOSABLE) ×2 IMPLANT
TRACER MAGTRACE VIAL (MISCELLANEOUS) ×2 IMPLANT
TRAY FAXITRON CT DISP (TRAY / TRAY PROCEDURE) ×2 IMPLANT
TUBE CONNECTING 20X1/4 (TUBING) ×2 IMPLANT
YANKAUER SUCT BULB TIP NO VENT (SUCTIONS) ×2 IMPLANT

## 2020-11-29 NOTE — Discharge Instructions (Signed)

## 2020-11-29 NOTE — Anesthesia Procedure Notes (Signed)
Anesthesia Regional Block: Pectoralis block   Pre-Anesthetic Checklist: , timeout performed,  Correct Patient, Correct Site, Correct Laterality,  Correct Procedure, Correct Position, site marked,  Risks and benefits discussed,  Surgical consent,  Pre-op evaluation,  At surgeon's request and post-op pain management  Laterality: Left  Prep: chloraprep       Needles:  Injection technique: Single-shot      Needle Length: 9cm  Needle Gauge: 22     Additional Needles: Arrow StimuQuik ECHO Echogenic Stimulating PNB Needle  Procedures:,,,, ultrasound used (permanent image in chart),,    Narrative:  Start time: 11/29/2020 10:07 AM End time: 11/29/2020 10:17 AM Injection made incrementally with aspirations every 5 mL.  Performed by: Personally  Anesthesiologist: Oleta Mouse, MD

## 2020-11-29 NOTE — Interval H&P Note (Signed)
History and Physical Interval Note:  11/29/2020 10:30 AM  Benn Moulder  has presented today for surgery, with the diagnosis of LEFT BREAST CANCER.  The various methods of treatment have been discussed with the patient and family. After consideration of risks, benefits and other options for treatment, the patient has consented to  Procedure(s): LEFT BREAST LUMPECTOMY WITH RADIOACTIVE SEED AND SENTINEL LYMPH NODE BIOPSY (Left) as a surgical intervention.  The patient's history has been reviewed, patient examined, no change in status, stable for surgery.  I have reviewed the patient's chart and labs.  Questions were answered to the patient's satisfaction.     Autumn Messing III

## 2020-11-29 NOTE — Transfer of Care (Signed)
Immediate Anesthesia Transfer of Care Note  Patient: Gabriela Evans  Procedure(s) Performed: LEFT BREAST LUMPECTOMY WITH RADIOACTIVE SEED AND SENTINEL LYMPH NODE BIOPSY (Left: Breast)  Patient Location: PACU  Anesthesia Type:General  Level of Consciousness: awake, alert  and oriented  Airway & Oxygen Therapy: Patient Spontanous Breathing and Patient connected to face mask oxygen  Post-op Assessment: Report given to RN and Post -op Vital signs reviewed and stable  Post vital signs: Reviewed and stable  Last Vitals:  Vitals Value Taken Time  BP    Temp    Pulse    Resp    SpO2      Last Pain:  Vitals:   11/29/20 0834  TempSrc: Oral  PainSc: 3       Patients Stated Pain Goal: 4 (36/12/24 4975)  Complications: No notable events documented.

## 2020-11-29 NOTE — Anesthesia Preprocedure Evaluation (Addendum)
Anesthesia Evaluation  Patient identified by MRN, date of birth, ID band Patient awake    Reviewed: Allergy & Precautions, NPO status , Patient's Chart, lab work & pertinent test results  History of Anesthesia Complications (+) PONV and history of anesthetic complications  Airway Mallampati: II  TM Distance: >3 FB Neck ROM: Full    Dental  (+) Dental Advisory Given, Teeth Intact   Pulmonary neg shortness of breath, neg sleep apnea, neg COPD, neg recent URI,    breath sounds clear to auscultation       Cardiovascular negative cardio ROS   Rhythm:Regular     Neuro/Psych negative neurological ROS  negative psych ROS   GI/Hepatic negative GI ROS, Neg liver ROS,   Endo/Other  negative endocrine ROS  Renal/GU negative Renal ROS     Musculoskeletal negative musculoskeletal ROS (+)   Abdominal   Peds  Hematology negative hematology ROS (+) Lab Results      Component                Value               Date                      WBC                      8.9                 10/30/2020                HGB                      13.1                10/30/2020                HCT                      38.6                10/30/2020                MCV                      87.1                10/30/2020                PLT                      291                 10/30/2020              Anesthesia Other Findings   Reproductive/Obstetrics                            Anesthesia Physical Anesthesia Plan  ASA: 1  Anesthesia Plan: General and Regional   Post-op Pain Management:  Regional for Post-op pain   Induction: Intravenous  PONV Risk Score and Plan: 4 or greater and Ondansetron, Dexamethasone, Propofol infusion, TIVA and Midazolam  Airway Management Planned: LMA  Additional Equipment: None  Intra-op Plan:   Post-operative Plan: Extubation in OR  Informed Consent: I have reviewed the patients  History and Physical, chart, labs and discussed the  procedure including the risks, benefits and alternatives for the proposed anesthesia with the patient or authorized representative who has indicated his/her understanding and acceptance.     Dental advisory given  Plan Discussed with: CRNA and Anesthesiologist  Anesthesia Plan Comments: (Ketamine)        Anesthesia Quick Evaluation

## 2020-11-29 NOTE — Op Note (Signed)
11/29/2020  12:12 PM  PATIENT:  Gabriela Evans  40 y.o. female  PRE-OPERATIVE DIAGNOSIS:  LEFT BREAST CANCER  POST-OPERATIVE DIAGNOSIS:  LEFT BREAST CANCER  PROCEDURE:  Procedure(s): LEFT BREAST LUMPECTOMY WITH RADIOACTIVE SEED LOCALIZATION AND DEEP LEFT AXILLARY SENTINEL LYMPH NODE BIOPSY (Left)  SURGEON:  Surgeon(s) and Role:    * Jovita Kussmaul, MD - Primary  PHYSICIAN ASSISTANT:   ASSISTANTS: none   ANESTHESIA:   local and general  EBL:  minimal   BLOOD ADMINISTERED:none  DRAINS: none   LOCAL MEDICATIONS USED:  MARCAINE     SPECIMEN:  Source of Specimen:  left breast tissue with additional medial margin and sentinel node x 2  DISPOSITION OF SPECIMEN:  PATHOLOGY  COUNTS:  YES  TOURNIQUET:  * No tourniquets in log *  DICTATION: .Dragon Dictation  After informed consent was obtained the patient was brought to the operating room and placed in the supine position on the operating table.  After adequate induction of general anesthesia the patient's left chest, breast, and axillary area were prepped with ChloraPrep, allowed to dry, and draped in usual sterile manner.  An appropriate timeout was performed.  2 cc of iron oxide were then injected in the subareolar plexus of the left breast and the breast was massaged for 5 minutes.  Previously an I-125 seed was placed in the outer aspect of the left breast to mark an area of invasive breast cancer.  The attention was first turned to the left axilla.  The mag trace was used to identify a signal in the left axilla.  The area overlying this was infiltrated with quarter percent Marcaine.  A small transversely oriented incision was made overlying the area of activity.  The incision was carried through the skin and subcutaneous tissue sharply with the electrocautery until the deep left axillary space was entered.  I was able to identify 2 hot lymph nodes.  Each of these was excised sharply with the electrocautery and the surrounding  small vessels and lymphatics were controlled with clips.  These were sent as sentinel nodes numbers 1 and 2.  No other hot or palpable nodes were identified in the left axilla.  Hemostasis was achieved using the Bovie electrocautery.  The deep layer of the incision was then closed with interrupted 3-0 Vicryl stitches.  The skin was closed with a running 4-0 Monocryl subcuticular stitch.  Attention was then turned to the left breast.  The neoprobe was set to I-125 in the area of radioactivity was readily identified.  The area around this was infiltrated with quarter percent Marcaine.  A curvilinear incision was made along the outer edge of the areola of the left breast with a 15 blade knife.  The incision was carried through the skin and subcutaneous tissue sharply with the electrocautery.  Dissection was then carried out throughout the outer aspect of the left breast between the breast tissue and the subcutaneous fat and skin until the dissection was well beyond the area of the cancer.  A circular portion of breast tissue was then excised sharply with the electrocautery around the radioactive seed while checking the area of radioactivity frequently.  Once the specimen was removed it was oriented with the appropriate paint colors.  A specimen radiograph was obtained that showed the clip and seed to be near the center of the specimen.  I did elect to take an additional medial margin and this was also marked appropriately.  The tissue was then all sent to  pathology for further evaluation.  Hemostasis was achieved using the Bovie electrocautery.  The wound was irrigated with copious amounts of saline.  The wound was infiltrated with more quarter percent Marcaine.  The deep layer of the wound was then closed with interrupted 3-0 Vicryl stitches.  The skin was then closed with interrupted 4-0 Monocryl subcuticular stitches.  Dermabond dressings were applied.  The patient tolerated the procedure well.  At the end of the  case all needle sponge and instrument counts were correct.  The patient was then awakened and taken recovery in stable condition.  PLAN OF CARE: Discharge to home after PACU  PATIENT DISPOSITION:  PACU - hemodynamically stable.   Delay start of Pharmacological VTE agent (>24hrs) due to surgical blood loss or risk of bleeding: not applicable

## 2020-11-29 NOTE — Progress Notes (Signed)
Assisted Dr. Moser with left, ultrasound guided, pectoralis block. Side rails up, monitors on throughout procedure. See vital signs in flow sheet. Tolerated Procedure well. 

## 2020-11-29 NOTE — Anesthesia Procedure Notes (Signed)
Procedure Name: LMA Insertion Date/Time: 11/29/2020 11:03 AM Performed by: Verita Lamb, CRNA Pre-anesthesia Checklist: Patient identified, Emergency Drugs available, Suction available and Patient being monitored Patient Re-evaluated:Patient Re-evaluated prior to induction Oxygen Delivery Method: Circle system utilized Preoxygenation: Pre-oxygenation with 100% oxygen Induction Type: IV induction Ventilation: Mask ventilation without difficulty LMA: LMA inserted LMA Size: 4.0 Number of attempts: 1 Airway Equipment and Method: Bite block Placement Confirmation: positive ETCO2, CO2 detector and breath sounds checked- equal and bilateral Tube secured with: Tape Dental Injury: Teeth and Oropharynx as per pre-operative assessment

## 2020-11-29 NOTE — H&P (Signed)
REFERRING PHYSICIAN: [facility] *  PROVIDER: Landry Corporal, MD  MRN: [id number] DOB: January 13, 1981 Subjective  Chief Complaint: Breast Cancer   History of Present Illness: Gabriela Evans is a 40 y.o. female who is seen today as an office consultation at the request of Dr. Imaging for evaluation of Breast Cancer .   We are asked to see the patient in consultation by Dr. Lindi Adie to evaluate her for a new left breast cancer. The patient is a 40 year old white female who initially felt a mass in the outer aspect of the left breast. She was evaluated with mammogram ultrasound and MRI and found to have a 9 mm mass in the outer aspect of the left breast. The lymph nodes all looked normal. 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 She has no family history of breast cancer. She does not smoke. She is otherwise in good health.  Review of Systems: A complete review of systems was obtained from the patient. I have reviewed this information and discussed as appropriate with the patient. See HPI as well for other ROS.  ROS   Medical History: Past Medical History:  Diagnosis Date   Anemia   Diabetes mellitus without complication (CMS-HCC)   History of cancer   Patient Active Problem List  Diagnosis   Malignant neoplasm of upper-outer quadrant of left breast in female, estrogen receptor positive (CMS-HCC)   Past Surgical History:  Procedure Laterality Date   CESAREAN SECTION  Date Unknown   HYSTERECTOMY N/A 04/26/2019  Robotic assisted total hysterectomy with bilateral salpingo oopherectomy    Allergies  Allergen Reactions   Nickel Swelling and Other (See Comments)   Oxycodone-Acetaminophen Itching   Current Outpatient Medications on File Prior to Visit  Medication Sig Dispense Refill   ascorbic acid, vitamin C, (VITAMIN C) 250 MG tablet Take by mouth    L.acid/B.animalis,bifidum/FOS (PROBIOTIC COMPLEX ORAL) Take 1 tablet by mouth once daily   multivitamin with minerals tablet Take 1 tablet by mouth once daily   vit C/zinc citrate/elderberry (ELDERBERRY IMMUNE HEALTH ORAL) Take by mouth   No current facility-administered medications on file prior to visit.   Family History  Problem Relation Age of Onset   Diabetes Mother    Social History   Tobacco Use  Smoking Status Never Smoker  Smokeless Tobacco Never Used    Social History   Socioeconomic History   Marital status: Unknown  Tobacco Use   Smoking status: Never Smoker   Smokeless tobacco: Never Used  Scientific laboratory technician Use: Never used  Substance and Sexual Activity   Alcohol use: Never   Drug use: Never   Objective:  There were no vitals filed for this visit.  There is no height or weight on file to calculate BMI.  Physical Exam Vitals reviewed.  Constitutional:  General: She is not in acute distress. Appearance: Normal appearance.  HENT:  Head: Normocephalic and atraumatic.  Right Ear: External ear normal.  Left Ear: External ear normal.  Nose: Nose normal.  Mouth/Throat:  Mouth: Mucous membranes are moist.  Pharynx: Oropharynx is clear.  Eyes:  General: No scleral icterus. Extraocular Movements: Extraocular movements intact.  Conjunctiva/sclera: Conjunctivae normal.  Pupils: Pupils are equal, round, and reactive to light.  Cardiovascular:  Rate and Rhythm: Normal rate and regular rhythm.  Pulses: Normal pulses.  Heart sounds: Normal heart sounds.  Pulmonary:  Effort: Pulmonary effort is normal. No respiratory distress.  Breath sounds: Normal breath sounds.  Abdominal:  General: Bowel sounds are normal.  Palpations: Abdomen is soft.  Tenderness: There is no abdominal tenderness.  Musculoskeletal:  General: No swelling, tenderness or deformity. Normal range of motion.  Cervical back: Normal range of motion and neck supple.  Skin: General: Skin is  warm and dry.  Coloration: Skin is not jaundiced.  Neurological:  General: No focal deficit present.  Mental Status: She is alert and oriented to person, place, and time.  Psychiatric:  Mood and Affect: Mood normal.  Behavior: Behavior normal.   Breast: The patient has symmetric dense nodular breast tissue bilaterally. There is some fullness near some bruising in the lateral aspect of the left breast. Other than this there is no distinct palpable mass in either breast. There is no palpable axillary, supraclavicular, or cervical lymphadenopathy.  Labs, Imaging and Diagnostic Testing:  Assessment and Plan:  Diagnoses and all orders for this visit:  Malignant neoplasm of upper-outer quadrant of left breast in female, estrogen receptor positive (CMS-HCC)    The patient appears to have a 9 mm cancer in the outer aspect of the left breast with clinically negative nodes. I have discussed with her in detail the different options for treatment and at this point she favors breast conservation. She is a good candidate for sentinel node biopsy as well. I have discussed with her in detail the risks and benefits of the operation as well as some of the technical aspects including the use of a radioactive seed for localization and she understands and wishes to proceed. She will also undergo genetic testing given her young age. She will also meet with medical and radiation oncology to discuss adjuvant therapy.

## 2020-11-30 NOTE — Anesthesia Postprocedure Evaluation (Signed)
Anesthesia Post Note  Patient: Gabriela Evans  Procedure(s) Performed: LEFT BREAST LUMPECTOMY WITH RADIOACTIVE SEED AND SENTINEL LYMPH NODE BIOPSY (Left: Breast)     Patient location during evaluation: PACU Anesthesia Type: Regional and General Level of consciousness: awake and alert Pain management: pain level controlled Vital Signs Assessment: post-procedure vital signs reviewed and stable Respiratory status: spontaneous breathing, nonlabored ventilation, respiratory function stable and patient connected to nasal cannula oxygen Cardiovascular status: blood pressure returned to baseline and stable Postop Assessment: no apparent nausea or vomiting Anesthetic complications: no   No notable events documented.  Last Vitals:  Vitals:   11/29/20 1330 11/29/20 1402  BP: (!) 155/75 134/86  Pulse: (!) 58 99  Resp: 10 16  Temp:  36.7 C  SpO2: 96% 98%    Last Pain:  Vitals:   11/29/20 1400  TempSrc:   PainSc: 4                  Luann Aspinwall

## 2020-12-02 ENCOUNTER — Encounter (HOSPITAL_BASED_OUTPATIENT_CLINIC_OR_DEPARTMENT_OTHER): Payer: Self-pay | Admitting: General Surgery

## 2020-12-02 LAB — SURGICAL PATHOLOGY

## 2020-12-02 NOTE — Addendum Note (Signed)
Addendum  created 12/02/20 1213 by Tawni Millers, CRNA   Charge Capture section accepted

## 2020-12-03 ENCOUNTER — Telehealth: Payer: Self-pay | Admitting: *Deleted

## 2020-12-03 ENCOUNTER — Encounter: Payer: Self-pay | Admitting: *Deleted

## 2020-12-03 NOTE — Telephone Encounter (Signed)
Received order for oncotype testing. Requisition faxed to pathology and [facility] °

## 2020-12-05 ENCOUNTER — Encounter: Payer: Self-pay | Admitting: General Practice

## 2020-12-05 NOTE — Progress Notes (Signed)
[facility] Spiritual Care Note  Left voicemail, encouraging return call when meaningful.   [place], [place], [facility] Pager [phone] Voicemail [phone]

## 2020-12-06 ENCOUNTER — Encounter: Payer: Self-pay | Admitting: General Practice

## 2020-12-06 NOTE — Progress Notes (Signed)
[facility] Spiritual Care Note  Received and returned call from [place]. Scheduled Spiritual Care visit in my office for ca 1:45 on Monday, immediately before her appointment with Dr Lindi Adie.   [place], [place], [facility] Pager [phone] Voicemail [phone]

## 2020-12-08 NOTE — Progress Notes (Incomplete)
Patient Care Team: Patient, [name] as PCP - General (General Practice) Rockwell Germany, RN as Oncology Nurse Navigator Mauro Kaufmann, RN as Oncology Nurse Navigator Jovita Kussmaul, MD as Consulting Physician (General Surgery) Nicholas Lose, MD as Consulting Physician (Hematology and Oncology) Kyung Rudd, MD as Consulting Physician (Radiation Oncology)  DIAGNOSIS: No diagnosis found.  SUMMARY OF ONCOLOGIC HISTORY: Oncology History  Malignant neoplasm of upper-outer quadrant of left breast in female, estrogen receptor positive ([place])  10/28/2020 Initial Diagnosis   Presented with new focal thickening and pain her outer left breast. Diagnostic mammogram and [place]: indeterminate mass in the left breast at 3:00. 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  Breast MRI: 1 prominent lymph node and 9 mm mass     11/06/2020 Genetic Testing   Negative hereditary cancer genetic testing: no pathogenic variants detected in [facility] BRCAPlus Panel and [facility] CancerNext-Expanded +RNAinsight Panel. The report dates are November 06, 2020 and November 10, 2020, respectively.   The BRCAplus panel offered by [employer] and includes sequencing and deletion/duplication analysis for the following 8 genes: ATM, BRCA1, BRCA2, CDH1, CHEK2, PALB2, PTEN, and TP53.  The CancerNext-Expanded gene panel offered by [facility] and includes sequencing, rearrangement, and RNA analysis for the following 77 genes: AIP, ALK, APC, ATM, AXIN2, BAP1, BARD1, BLM, BMPR1A, BRCA1, BRCA2, BRIP1, CDC73, CDH1, CDK4, CDKN1B, CDKN2A, CHEK2, CTNNA1, DICER1, FANCC, FH, FLCN, GALNT12, KIF1B, LZTR1, MAX, MEN1, MET, MLH1, MSH2, MSH3, MSH6, MUTYH, NBN, NF1, NF2, NTHL1, PALB2, PHOX2B, PMS2, POT1, PRKAR1A, PTCH1, PTEN, RAD51C, RAD51D, RB1, RECQL, RET, SDHA, SDHAF2, SDHB, SDHC, SDHD, SMAD4, SMARCA4, SMARCB1, SMARCE1, STK11, SUFU, TMEM127, TP53, TSC1, TSC2, VHL and XRCC2  (sequencing and deletion/duplication); EGFR, EGLN1, HOXB13, KIT, MITF, PDGFRA, POLD1, and POLE (sequencing only); EPCAM and GREM1 (deletion/duplication only).      CHIEF COMPLIANT: Follow-up of left breast cancer  INTERVAL HISTORY: Gabriela Evans is a 40 y.o. with above-mentioned history of left breast cancer. She presents to the clinic today for follow-up. Left lumpectomy on 11/29/2020 showed mucinous carcinoma with clear margins of resection and lymph nodes negative for carcinoma. She presents to the clinic today for follow-up.   ALLERGIES:  is allergic to nickel, other, and oxycodone-acetaminophen.  MEDICATIONS:  Current Outpatient Medications  Medication Sig Dispense Refill   acetaminophen (TYLENOL) 500 MG tablet Take 500 mg by mouth every 6 (six) hours as needed.     BLACK ELDERBERRY,BERRY-FLOWER, PO Take 1 capsule by mouth daily.     Charcoal Activated (ACTIVATED CHARCOAL) POWD by Does not apply route as needed.     ibuprofen (ADVIL) 400 MG tablet Take 400 mg by mouth every 6 (six) hours as needed.     Medium Chain Triglycerides (MCT OIL PO) Take by mouth daily.     Multiple Vitamins-Minerals (MULTIVITAMIN WITH MINERALS) tablet Take 2 tablets by mouth daily.     Probiotic Product (PROBIOTIC PO) Take 1 tablet by mouth daily.     prochlorperazine (COMPAZINE) 10 MG tablet Take 1 tablet (10 mg total) by mouth every 6 (six) hours as needed for nausea or vomiting. 30 tablet 0   Theanine 200 MG CAPS Take by mouth.     traMADol (ULTRAM) 50 MG tablet Take 1 tablet (50 mg total) by mouth every 6 (six) hours as needed. 20 tablet 0   vitamin C (ASCORBIC ACID) 250 MG tablet Take 250 mg by mouth daily.     vitamin E 1000 UNIT capsule Take 1,000  Units by mouth daily.     No current facility-administered medications for this visit.    PHYSICAL EXAMINATION: ECOG PERFORMANCE STATUS: {CHL ONC ECOG [id number]}  There were no vitals filed for this visit. There were no vitals filed for this  visit.  BREAST:*** No palpable masses or nodules in either right or left breasts. No palpable axillary supraclavicular or infraclavicular adenopathy no breast tenderness or nipple discharge. (exam performed in the presence of a chaperone)  LABORATORY DATA:  I have reviewed the data as listed CMP Latest Ref Rng & Units 10/30/2020 10/08/2020  Glucose 70 - 99 mg/dL 95 116(H)  BUN 6 - 20 mg/dL 12 14  Creatinine 0.44 - 1.00 mg/dL 0.67 0.72  Sodium 135 - 145 mmol/L 142 139  Potassium 3.5 - 5.1 mmol/L 4.0 3.8  Chloride 98 - 111 mmol/L 110 105  CO2 22 - 32 mmol/L 25 25  Calcium 8.9 - 10.3 mg/dL 9.8 9.5  Total Protein 6.5 - 8.1 g/dL 7.6 6.8  Total Bilirubin 0.3 - 1.2 mg/dL 0.4 0.3  Alkaline Phos 38 - 126 U/L 46 -  AST 15 - 41 U/L 17 16  ALT 0 - 44 U/L 19 14    Lab Results  Component Value Date   WBC 8.9 10/30/2020   HGB 13.1 10/30/2020   HCT 38.6 10/30/2020   MCV 87.1 10/30/2020   PLT 291 10/30/2020   NEUTROABS 5.2 10/30/2020    ASSESSMENT & PLAN:  No problem-specific Assessment & Plan notes found for this encounter.    No orders of the defined types were placed in this encounter.  The patient has a good understanding of the overall plan. she agrees with it. she will call with any problems that may develop before the next visit here.  Total time spent: *** mins including face to face time and time spent for planning, charting and coordination of care  Rulon Eisenmenger, MD, MPH 12/08/2020  I, Thana Ates, am acting as scribe for Dr. Nicholas Lose.  {insert scribe attestation}

## 2020-12-09 ENCOUNTER — Inpatient Hospital Stay: Payer: Managed Care, Other (non HMO) | Admitting: Hematology and Oncology

## 2020-12-09 ENCOUNTER — Telehealth: Payer: Self-pay | Admitting: Hematology and Oncology

## 2020-12-09 NOTE — Assessment & Plan Note (Deleted)
10/23/2020:Presented with new focal thickening and pain her outer left breast. Diagnostic mammogram and [place]: indeterminate mass in the left breast at 3:00. 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 I discussed the margins as well as lymph node surgeries. We also discussed the final staging along with previously performed ER/PR and HER-2/neu testing.  Treatment plan: 1. Oncotype DX testing to determine if chemotherapy would be of any benefit followed by 2. Adjuvant radiation therapy followed by 3. Adjuvant antiestrogen therapy 4.  Genetic testing  Return to clinic based on Oncotype DX test result

## 2020-12-09 NOTE — Telephone Encounter (Signed)
Scheduled per sch msg. Called and spoke with patient. Confirmed new date and time  

## 2020-12-13 ENCOUNTER — Encounter (HOSPITAL_COMMUNITY): Payer: Self-pay

## 2020-12-15 NOTE — Progress Notes (Incomplete)
Patient Care Team: Patient, [name] as PCP - General (General Practice) Rockwell Germany, RN as Oncology Nurse Navigator Mauro Kaufmann, RN as Oncology Nurse Navigator Jovita Kussmaul, MD as Consulting Physician (General Surgery) Nicholas Lose, MD as Consulting Physician (Hematology and Oncology) Kyung Rudd, MD as Consulting Physician (Radiation Oncology)  DIAGNOSIS: No diagnosis found.  SUMMARY OF ONCOLOGIC HISTORY: Oncology History  Malignant neoplasm of upper-outer quadrant of left breast in female, estrogen receptor positive ([place])  10/28/2020 Initial Diagnosis   Presented with new focal thickening and pain her outer left breast. Diagnostic mammogram and [place]: indeterminate mass in the left breast at 3:00. 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  Breast MRI: 1 prominent lymph node and 9 mm mass     11/06/2020 Genetic Testing   Negative hereditary cancer genetic testing: no pathogenic variants detected in [facility] BRCAPlus Panel and [facility] CancerNext-Expanded +RNAinsight Panel. The report dates are November 06, 2020 and November 10, 2020, respectively.   The BRCAplus panel offered by [employer] and includes sequencing and deletion/duplication analysis for the following 8 genes: ATM, BRCA1, BRCA2, CDH1, CHEK2, PALB2, PTEN, and TP53.  The CancerNext-Expanded gene panel offered by [facility] and includes sequencing, rearrangement, and RNA analysis for the following 77 genes: AIP, ALK, APC, ATM, AXIN2, BAP1, BARD1, BLM, BMPR1A, BRCA1, BRCA2, BRIP1, CDC73, CDH1, CDK4, CDKN1B, CDKN2A, CHEK2, CTNNA1, DICER1, FANCC, FH, FLCN, GALNT12, KIF1B, LZTR1, MAX, MEN1, MET, MLH1, MSH2, MSH3, MSH6, MUTYH, NBN, NF1, NF2, NTHL1, PALB2, PHOX2B, PMS2, POT1, PRKAR1A, PTCH1, PTEN, RAD51C, RAD51D, RB1, RECQL, RET, SDHA, SDHAF2, SDHB, SDHC, SDHD, SMAD4, SMARCA4, SMARCB1, SMARCE1, STK11, SUFU, TMEM127, TP53, TSC1, TSC2, VHL and XRCC2  (sequencing and deletion/duplication); EGFR, EGLN1, HOXB13, KIT, MITF, PDGFRA, POLD1, and POLE (sequencing only); EPCAM and GREM1 (deletion/duplication only).    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 with above-mentioned history of left breast cancer. 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 She presents to the clinic today for follow-up.  ALLERGIES:  is allergic to nickel, other, and oxycodone-acetaminophen.  MEDICATIONS:  Current Outpatient Medications  Medication Sig Dispense Refill   acetaminophen (TYLENOL) 500 MG tablet Take 500 mg by mouth every 6 (six) hours as needed.     BLACK ELDERBERRY,BERRY-FLOWER, PO Take 1 capsule by mouth daily.     Charcoal Activated (ACTIVATED CHARCOAL) POWD by Does not apply route as needed.     ibuprofen (ADVIL) 400 MG tablet Take 400 mg by mouth every 6 (six) hours as needed.     Medium Chain Triglycerides (MCT OIL PO) Take by mouth daily.     Multiple Vitamins-Minerals (MULTIVITAMIN WITH MINERALS) tablet Take 2 tablets by mouth daily.     Probiotic Product (PROBIOTIC PO) Take 1 tablet by mouth daily.     prochlorperazine (COMPAZINE) 10 MG tablet Take 1 tablet (10 mg total) by mouth every 6 (six) hours as needed for nausea or vomiting. 30 tablet 0   Theanine 200 MG CAPS Take by mouth.     traMADol (ULTRAM) 50 MG tablet Take 1 tablet (50 mg total) by mouth every 6 (six) hours as needed. 20 tablet 0  vitamin C (ASCORBIC ACID) 250 MG tablet Take 250 mg by mouth daily.     vitamin E 1000 UNIT capsule Take 1,000 Units by mouth daily.     No current facility-administered medications for this visit.    PHYSICAL EXAMINATION: ECOG  PERFORMANCE STATUS: {CHL ONC ECOG PS:[id number]}  There were no vitals filed for this visit. There were no vitals filed for this visit.  BREAST:*** No palpable masses or nodules in either right or left breasts. No palpable axillary supraclavicular or infraclavicular adenopathy no breast tenderness or nipple discharge. (exam performed in the presence of a chaperone)  LABORATORY DATA:  I have reviewed the data as listed CMP Latest Ref Rng & Units 10/30/2020 10/08/2020  Glucose 70 - 99 mg/dL 95 116(H)  BUN 6 - 20 mg/dL 12 14  Creatinine 0.44 - 1.00 mg/dL 0.67 0.72  Sodium 135 - 145 mmol/L 142 139  Potassium 3.5 - 5.1 mmol/L 4.0 3.8  Chloride 98 - 111 mmol/L 110 105  CO2 22 - 32 mmol/L 25 25  Calcium 8.9 - 10.3 mg/dL 9.8 9.5  Total Protein 6.5 - 8.1 g/dL 7.6 6.8  Total Bilirubin 0.3 - 1.2 mg/dL 0.4 0.3  Alkaline Phos 38 - 126 U/L 46 -  AST 15 - 41 U/L 17 16  ALT 0 - 44 U/L 19 14    Lab Results  Component Value Date   WBC 8.9 10/30/2020   HGB 13.1 10/30/2020   HCT 38.6 10/30/2020   MCV 87.1 10/30/2020   PLT 291 10/30/2020   NEUTROABS 5.2 10/30/2020    ASSESSMENT & PLAN:  No problem-specific Assessment & Plan notes found for this encounter.    No orders of the defined types were placed in this encounter.  The patient has a good understanding of the overall plan. she agrees with it. she will call with any problems that may develop before the next visit here.  Total time spent: *** mins including face to face time and time spent for planning, charting and coordination of care  Rulon Eisenmenger, MD, MPH 12/15/2020  I, Thana Ates, am acting as scribe for Dr. Nicholas Lose.  {insert scribe attestation}

## 2020-12-16 ENCOUNTER — Ambulatory Visit: Payer: Managed Care, Other (non HMO) | Admitting: Rehabilitation

## 2020-12-16 ENCOUNTER — Other Ambulatory Visit: Payer: Self-pay

## 2020-12-16 ENCOUNTER — Ambulatory Visit
Admission: RE | Admit: 2020-12-16 | Discharge: 2020-12-16 | Disposition: A | Discharge status: Home / Self Care | Payer: Managed Care, Other (non HMO) | Source: Ambulatory Visit | Attending: Radiation Oncology | Admitting: Radiation Oncology

## 2020-12-16 ENCOUNTER — Telehealth: Payer: Self-pay

## 2020-12-16 ENCOUNTER — Inpatient Hospital Stay: Payer: Managed Care, Other (non HMO) | Attending: Hematology and Oncology | Admitting: Hematology and Oncology

## 2020-12-16 ENCOUNTER — Encounter: Payer: Self-pay | Admitting: Radiation Oncology

## 2020-12-16 ENCOUNTER — Encounter: Payer: Self-pay | Admitting: Rehabilitation

## 2020-12-16 ENCOUNTER — Inpatient Hospital Stay: Payer: Managed Care, Other (non HMO) | Admitting: Hematology and Oncology

## 2020-12-16 VITALS — BP 136/100 | HR 71 | Temp 97.6°F | Wt 214.9 lb

## 2020-12-16 DIAGNOSIS — Z170 Estrogen receptor positive status [ER+]: Secondary | ICD-10-CM | POA: Diagnosis not present

## 2020-12-16 DIAGNOSIS — Z800 Family history of malignant neoplasm of digestive organs: Secondary | ICD-10-CM | POA: Diagnosis not present

## 2020-12-16 DIAGNOSIS — C50412 Malignant neoplasm of upper-outer quadrant of left female breast: Secondary | ICD-10-CM | POA: Diagnosis present

## 2020-12-16 DIAGNOSIS — Z808 Family history of malignant neoplasm of other organs or systems: Secondary | ICD-10-CM | POA: Insufficient documentation

## 2020-12-16 DIAGNOSIS — M542 Cervicalgia: Secondary | ICD-10-CM

## 2020-12-16 DIAGNOSIS — Z8042 Family history of malignant neoplasm of prostate: Secondary | ICD-10-CM | POA: Insufficient documentation

## 2020-12-16 DIAGNOSIS — R600 Localized edema: Secondary | ICD-10-CM

## 2020-12-16 DIAGNOSIS — Z79899 Other long term (current) drug therapy: Secondary | ICD-10-CM | POA: Insufficient documentation

## 2020-12-16 DIAGNOSIS — R293 Abnormal posture: Secondary | ICD-10-CM

## 2020-12-16 NOTE — Assessment & Plan Note (Addendum)
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 I discussed the margins.  We also discussed the final staging along with previously performed ER/PR testing.  Treatment plan: 1. Oncotype DX: Low risk no role of chemo 2. Adjuvant radiation therapy followed by 3. Adjuvant antiestrogen therapy  Return to clinic after radiation is complete.

## 2020-12-16 NOTE — Therapy (Signed)
[facility] @ [address] [place], [place], [address] Phone: [phone]   Fax:  [phone]  Physical Therapy Treatment  Patient Details  Name: Gabriela Evans MRN: [id number] Date of Birth: [birth year] Referring Provider (PT): Dr. Autumn Messing   Encounter Date: 12/16/2020   PT End of Session - 12/16/20 2216     Visit Number 3    Number of Visits 15    Date for PT Re-Evaluation 01/27/21    PT Start Time 1100    PT Stop Time 2951    PT Time Calculation (min) 53 min    Activity Tolerance Patient tolerated treatment well    Behavior During Therapy [facility] for tasks assessed/performed             Past Medical History:  Diagnosis Date   Anemia    Breast cancer ([place])    Complication of anesthesia    nausea   Gestational diabetes    Kidney infection    Other abnormal glucose    Gestational diabetes with both children   Pneumonia    01-2019   PONV (postoperative nausea and vomiting)     Past Surgical History:  Procedure Laterality Date   BREAST LUMPECTOMY WITH RADIOACTIVE SEED AND SENTINEL LYMPH NODE BIOPSY Left 11/29/2020   Procedure: LEFT BREAST LUMPECTOMY WITH RADIOACTIVE SEED AND SENTINEL LYMPH NODE BIOPSY;  Surgeon: Jovita Kussmaul, MD;  Location: [facility];  Service: General;  Laterality: Left;   CESAREAN SECTION     X2   IUD inserted in [place]  2009   remove perforated IUD   ROBOTIC ASSISTED LAPAROSCOPIC LYSIS OF ADHESION N/A 04/26/2019   Procedure: XI ROBOTIC ASSISTED LAPAROSCOPIC LYSIS OF ADHESION;  Surgeon: [name], MD;  Location: [facility];  Service: Gynecology;  Laterality: N/A;   ROBOTIC ASSISTED TOTAL HYSTERECTOMY WITH BILATERAL SALPINGO OOPHERECTOMY Bilateral 04/26/2019   Procedure: XI ROBOTIC ASSISTED TOTAL LAPAROSCOPIC HYSTERECTOMY WITH BILATERAL SALPINGECTOMY;  Surgeon: [name], MD;  Location: [facility];  Service:  Gynecology;  Laterality: Bilateral;  requests 8:30 OR start in [place] block time requests 2 hours   WISDOM TOOTH EXTRACTION      There were no vitals filed for this visit.   Subjective Assessment - 12/16/20 1058     Subjective I am still having that pain from the neck from right before surgery.  Also surgical pain and swelling in the breast    Pertinent History Lt lumpectomy with 2 lymph nodes removed on 11/29/20.  She underwent a partial hysterectomy (ovaries still remain) in 04/2019. Has to do radiation.    Limitations Lifting    Patient Stated Goals Decrease neck pain, decrease left arm pain, decrease breast swelling    Currently in Pain? Yes    Pain Score 2     Pain Location Scapula    Pain Orientation Left    Pain Descriptors / Indicators Discomfort    Pain Type Acute pain;Surgical pain    Pain Radiating Towards the neck    Pain Onset 1 to 4 weeks ago    Pain Frequency Intermittent    Aggravating Factors  turning the neck both ways, intermittent arm movements                [facility] PT Assessment - 12/16/20 0001       Observation/Other Assessments   Observations axillary incision and nipple incision healed and scabbed with bruising  around the nipple.  Lt breast larger than the Rt due to general edema and indentations at the medial breast from the bra      Sensation   Additional Comments int numbness and tingling Lt UE in mainly ulnar distribution ending in the 5th digit      AROM   Left Shoulder Extension 60 Degrees    Left Shoulder Flexion 155 Degrees    Left Shoulder ABduction 135 Degrees    Left Shoulder External Rotation 90 Degrees    Cervical Flexion 45   pull into neck   Cervical Extension 50   some tignthess   Cervical - Right Rotation 60   suboccipital tension Rt   Cervical - Left Rotation 45   suboccipital tension on the Lt     Palpation   Palpation comment increased scar tissue and edema Lt axilla near axillary incision, what feels like a cord  from medial axillary incision running into breast with some ttp.  No cording evident in the UE, some fibrosis inferior lateral breast.     In regards to the scapular pain pt has +1 ttp upper trap and levator scapulae with trigger points present, suboccipitial tension and stiffness C4-7 with CPA pressure in seated               LYMPHEDEMA/ONCOLOGY QUESTIONNAIRE - 12/16/20 0001       Type   Cancer Type Left breast cancer                        [facility] Adult PT Treatment/Exercise - 12/16/20 0001       Exercises   Exercises Other Exercises    Other Exercises  updated HEP to include seated UT, LS stretches, cervical retraction, and ulnar nerve flossing in addition to post op 4 exercises.      Manual Therapy   Manual Therapy Joint mobilization;Edema management    Manual therapy comments Suboccipital release in supine.    Edema Management made 1/4" small foam insert for the left lateral axilla    Joint Mobilization in seated bil UPA/CPA in seated C4-7 x 20" each    Soft tissue mobilization To the left upper traps, levator, interscapular regions, and rhomboids in supine and in right sidelying with Palmer's lotion.    Passive ROM PROM to cervical spine in supine - sidebending and rotation to pt tolerance                          PT Long Term Goals - 12/16/20 2225       PT LONG TERM GOAL #1   Title Patient will demonstrate she has regained full shoulder ROM and function post operatively compared to baselines.    Status On-going      PT LONG TERM GOAL #2   Title Patient will report >/= 50% less pain and tightness in her neck and scapular region to tolerate daily tasks with less difficulty.    Status On-going      PT LONG TERM GOAL #3   Title Patient will increase cervical AROM to be WNL for improved overall movement for tunring head while driving and doing daily tasks.    Status On-going      PT LONG TERM GOAL #4   Title Pt will decrease left breast  discomfort to none during the day    Time 6    Period Weeks    Status New  Plan - 12/16/20 2217     Clinical Impression Statement Pt is now 2.5 weeks post left lumpectomy with removal of 2 negative lymph nodes with continued pain into the Left scapular region, cspine, and left UE in an ulnar distribution from prior to surgery when pt tweaked her neck.  Pt is much improved since this incident but is still having referred pain, muscle tension, and pain and cervical AROM.  2 trigger points noted today referring pain into the UE with palpation.  In regards to the breast surgery pt presents with breast edema, increased scar tissue, and what feels like a prominent cord from the axillary incision into the breast.  Pt would benefit from PT 1-2x per week for up to 6 weeks but pt lives in [place] so we will see if she can transfer care to this location returning here for [facility] screening.    Personal Factors and Comorbidities Comorbidity 1    Comorbidities breast cancer hx, SLNB    Examination-Activity Limitations Lift;Reach Overhead    Stability/Clinical Decision Making Stable/Uncomplicated    PT Frequency 2x / week    PT Duration 6 weeks    PT Treatment/Interventions ADLs/Self Care Home Management;Therapeutic exercise;Patient/family education;Dry needling;Passive range of motion;Manual techniques;Scar mobilization;Moist Heat;Manual lymph drainage;Taping;Traction    PT Next Visit Plan how is the neck pain? continue STM to the Lt upper quadrant and cervical stretches/joint mob to continue to decrease referred cervical pain, ulnar nerve flossing, then start shoulder PROM, cording release and education in self MLD for the left breast             Patient will benefit from skilled therapeutic intervention in order to improve the following deficits and impairments:  Postural dysfunction, Decreased range of motion, Decreased knowledge of precautions, Impaired UE functional use, Pain,  Decreased scar mobility  Visit Diagnosis: Malignant neoplasm of upper-outer quadrant of left breast in female, estrogen receptor positive ([facility])  Abnormal posture  Cervicalgia  Localized edema     Problem List Patient Active Problem List   Diagnosis Date Noted   Genetic testing 11/07/2020   Malignant neoplasm of upper-outer quadrant of left breast in female, estrogen receptor positive ([place]) 10/28/2020   Postoperative state 04/26/2019   Post-operative state 04/26/2019   Menorrhagia 03/21/2018    Stark Bray, PT 12/16/2020, 10:28 PM  [facility] @ [address] [place], [place], [address] Phone: [phone]   Fax:  [phone]  Name: Gabriela Evans MRN: [id number] Date of Birth: 1980/06/29

## 2020-12-16 NOTE — Telephone Encounter (Signed)
Notified patient of completion of short term disability form. Fax transmission confirmation received. Patient picked up copy from Provider's Nurse as requested.

## 2020-12-16 NOTE — Consult Note (Signed)
NEW PATIENT EVALUATION  Name: Gabriela Evans  MRN: [id number]  Date:   12/16/2020     DOB: September 02, 1980   This 40 y.o. 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  REFERRING PHYSICIAN: Hayden Pedro,*  CHIEF COMPLAINT:  Chief Complaint  Patient presents with   Breast Cancer    DIAGNOSIS: The encounter diagnosis was Malignant neoplasm of upper-outer quadrant of left breast in female, estrogen receptor positive ([place]).   PREVIOUS INVESTIGATIONS:  MR, mammograms and ultrasound all reviewed Pathology report reviewed Clinical notes reviewed  HPI: Patient is a 40 year old female who presented some thicknessAround 3 o'clock position of her left breast and some breast pain.This prompted mammogramShowing indeterminate mass in the left breast at the 3 o'clock position possibly a fibroadenoma.  Breast MRI showed mass in the lower outer left breast measuring approximately 8 to 9 mm was a prominent left axillary lymph node although on ultrasound this was normal.  Ultrasound-guided biopsy was positive for invasive mammary carcinoma.  She went on to have a wide local excision and sentinel node biopsy.  Final pathology showed a overall grade two 1.2 cm x 1.1 cm invasive mammary carcinoma mucinous type.  All margins were clear.  Margins were at least 5 mm DCIS was focally positive although margin of for that also was 4 mm.  4 sentinel lymph nodes were examined all negative for metastatic disease.  Again tumor was ER/PR positive HER2/neu not overexpressed.  She had an Oncotype DX score of 20 showing a less than 1% benefit to chemotherapy.  She is seen today for consideration of radiation.  She is doing well still has some tenderness of the left breast and some left axillary tenderness which is all  expected at this time.  PLANNED TREATMENT REGIMEN: Left hypofractionated whole breast radiation  PAST MEDICAL HISTORY:  has a past medical history of Anemia, Breast cancer ([place]), Complication of anesthesia, Gestational diabetes, Kidney infection, Other abnormal glucose, Pneumonia, and PONV (postoperative nausea and vomiting).    PAST SURGICAL HISTORY:  Past Surgical History:  Procedure Laterality Date   BREAST LUMPECTOMY WITH RADIOACTIVE SEED AND SENTINEL LYMPH NODE BIOPSY Left 11/29/2020   Procedure: LEFT BREAST LUMPECTOMY WITH RADIOACTIVE SEED AND SENTINEL LYMPH NODE BIOPSY;  Surgeon: Jovita Kussmaul, MD;  Location: [facility];  Service: General;  Laterality: Left;   CESAREAN SECTION     X2   IUD inserted in [place]  2009   remove perforated IUD   ROBOTIC ASSISTED LAPAROSCOPIC LYSIS OF ADHESION N/A 04/26/2019   Procedure: XI ROBOTIC ASSISTED LAPAROSCOPIC LYSIS OF ADHESION;  Surgeon: [name], MD;  Location: [facility];  Service: Gynecology;  Laterality: N/A;   ROBOTIC ASSISTED TOTAL HYSTERECTOMY WITH BILATERAL SALPINGO OOPHERECTOMY Bilateral 04/26/2019   Procedure: XI ROBOTIC ASSISTED TOTAL LAPAROSCOPIC HYSTERECTOMY WITH BILATERAL SALPINGECTOMY;  Surgeon: [name], MD;  Location: [facility];  Service: Gynecology;  Laterality: Bilateral;  requests 8:30 OR start in [place] [facility] block time requests 2 hours   WISDOM TOOTH EXTRACTION      FAMILY HISTORY: family history includes Brain cancer (age of onset: 2) in her maternal uncle; Cancer in her paternal uncle; Diabetes in her father and mother; Melanoma in her paternal grandmother; Prostate cancer in her maternal grandfather;  Rectal cancer in her paternal uncle; Thyroid cancer in her maternal aunt and maternal grandmother; Thyroid disease in her father and sister.  SOCIAL HISTORY:  reports that she has never smoked. She has never used smokeless tobacco. She  reports current alcohol use. She reports that she does not use drugs.  ALLERGIES: Nickel, Other, and Oxycodone-acetaminophen  MEDICATIONS:  Current Outpatient Medications  Medication Sig Dispense Refill   acetaminophen (TYLENOL) 500 MG tablet Take 500 mg by mouth every 6 (six) hours as needed.     BLACK ELDERBERRY,BERRY-FLOWER, PO Take 1 capsule by mouth daily.     Charcoal Activated (ACTIVATED CHARCOAL) POWD by Does not apply route as needed.     ibuprofen (ADVIL) 400 MG tablet Take 400 mg by mouth every 6 (six) hours as needed.     Medium Chain Triglycerides (MCT OIL PO) Take by mouth daily.     Multiple Vitamins-Minerals (MULTIVITAMIN WITH MINERALS) tablet Take 2 tablets by mouth daily.     Probiotic Product (PROBIOTIC PO) Take 1 tablet by mouth daily.     prochlorperazine (COMPAZINE) 10 MG tablet Take 1 tablet (10 mg total) by mouth every 6 (six) hours as needed for nausea or vomiting. 30 tablet 0   Theanine 200 MG CAPS Take by mouth.     traMADol (ULTRAM) 50 MG tablet Take 1 tablet (50 mg total) by mouth every 6 (six) hours as needed. 20 tablet 0   vitamin C (ASCORBIC ACID) 250 MG tablet Take 250 mg by mouth daily.     vitamin E 1000 UNIT capsule Take 1,000 Units by mouth daily.     No current facility-administered medications for this encounter.    ECOG PERFORMANCE STATUS:  0 - Asymptomatic  REVIEW OF SYSTEMS collected and documented by nursing like to take this opportunity for allowing me to participate in the patient care of this patient. PHYSICAL EXAM: BP (!) 136/100   Pulse 71   Temp 97.6 F (36.4 C) (Tympanic)   Wt 214 lb 14.4 oz (97.5 kg)   LMP 04/18/2019   BMI 31.74 kg/m patient is a well-developed well-nourished female in NAD she is status post recent lumpectomy of the left breast with incision healing well no dominant masses noted in either breast.  No axillary or supraclavicular adenopathy is identified.  She is also status post axillary node dissection which is  healing well.  Lungs are clear to AMP cardiac examination shows regular rate and rhythm.  Abdomen is benign.  Motor or sensory in detail levels are equal and symmetric in the upper lower extremities.  No peripheral edema is noted.   LABORATORY DATA: Pathology report reviewed    RADIOLOGY RESULTS: Mammograms ultrasound and MRI scans reviewed compatible with above-stated findings   IMPRESSION: Stage Ia (T1 cN0 M0 ER/PR positive HER2 negative invasive mammary carcinoma left breast status post wide local excision and sentinel node biopsy in 40 year old female with low Oncotype DX score  PLAN: At this time I recommended a hypofractionated course of whole breast radiation over 3 weeks.  Would also boost her scar another 1000 cGy using electron beam.  Risks and benefits of treatment occluding skin reaction fatigue alteration of blood counts possible inclusion of superficial lung alteration of blood counts all were described in detail to the patient.  I personally set up and ordered CT simulation for later this week.  Patient comprehends my recommendations well.  She also will be a candidate from aromatase inhibitor after completion of radiation.  Macro thanks  Noreene Filbert, MD

## 2020-12-16 NOTE — Progress Notes (Signed)
Patient Care Team: Patient, [name] as PCP - General (General Practice) Rockwell Germany, RN as Oncology Nurse Navigator Mauro Kaufmann, RN as Oncology Nurse Navigator Jovita Kussmaul, MD as Consulting Physician (General Surgery) Nicholas Lose, MD as Consulting Physician (Hematology and Oncology) Kyung Rudd, MD as Consulting Physician (Radiation Oncology)  DIAGNOSIS:  Encounter Diagnosis  Name Primary?   Malignant neoplasm of upper-outer quadrant of left breast in female, estrogen receptor positive ([place])     SUMMARY OF ONCOLOGIC HISTORY: Oncology History  Malignant neoplasm of upper-outer quadrant of left breast in female, estrogen receptor positive ([place])  10/28/2020 Initial Diagnosis   Presented with new focal thickening and pain her outer left breast. Diagnostic mammogram and [place]: indeterminate mass in the left breast at 3:00. 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  Breast MRI: 1 prominent lymph node and 9 mm mass     11/06/2020 Genetic Testing   Negative hereditary cancer genetic testing: no pathogenic variants detected in [facility] BRCAPlus Panel and [facility] CancerNext-Expanded +RNAinsight Panel. The report dates are November 06, 2020 and November 10, 2020, respectively.   The BRCAplus panel offered by [employer] and includes sequencing and deletion/duplication analysis for the following 8 genes: ATM, BRCA1, BRCA2, CDH1, CHEK2, PALB2, PTEN, and TP53.  The CancerNext-Expanded gene panel offered by [facility] and includes sequencing, rearrangement, and RNA analysis for the following 77 genes: AIP, ALK, APC, ATM, AXIN2, BAP1, BARD1, BLM, BMPR1A, BRCA1, BRCA2, BRIP1, CDC73, CDH1, CDK4, CDKN1B, CDKN2A, CHEK2, CTNNA1, DICER1, FANCC, FH, FLCN, GALNT12, KIF1B, LZTR1, MAX, MEN1, MET, MLH1, MSH2, MSH3, MSH6, MUTYH, NBN, NF1, NF2, NTHL1, PALB2, PHOX2B, PMS2, POT1, PRKAR1A, PTCH1, PTEN, RAD51C, RAD51D, RB1, RECQL,  RET, SDHA, SDHAF2, SDHB, SDHC, SDHD, SMAD4, SMARCA4, SMARCB1, SMARCE1, STK11, SUFU, TMEM127, TP53, TSC1, TSC2, VHL and XRCC2 (sequencing and deletion/duplication); EGFR, EGLN1, HOXB13, KIT, MITF, PDGFRA, POLD1, and POLE (sequencing only); EPCAM and GREM1 (deletion/duplication only).    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  She is healing and recovering very well from the surgery.  She had cording in the breast and axilla and has seen physical therapy today.  She is still very sore from the breast surgery.   ALLERGIES:  is allergic to nickel, other, and oxycodone-acetaminophen.  MEDICATIONS:  Current Outpatient Medications  Medication Sig Dispense Refill   acetaminophen (TYLENOL) 500 MG tablet Take 500 mg by mouth every 6 (six) hours as needed.     BLACK ELDERBERRY,BERRY-FLOWER, PO Take 1 capsule by mouth daily.     Charcoal Activated (ACTIVATED CHARCOAL) POWD by Does not apply route as needed.     ibuprofen (ADVIL) 400 MG tablet Take 400 mg by mouth every 6 (six) hours as needed.     Medium Chain Triglycerides (MCT OIL PO) Take by mouth daily.     Multiple Vitamins-Minerals (MULTIVITAMIN WITH MINERALS) tablet Take 2 tablets by mouth daily.     Probiotic Product (PROBIOTIC PO) Take 1 tablet by mouth daily.     prochlorperazine (COMPAZINE) 10 MG  tablet Take 1 tablet (10 mg total) by mouth every 6 (six) hours as needed for nausea or vomiting. 30 tablet 0   Theanine 200 MG CAPS Take by mouth.     traMADol (ULTRAM) 50 MG tablet Take 1  tablet (50 mg total) by mouth every 6 (six) hours as needed. 20 tablet 0   vitamin C (ASCORBIC ACID) 250 MG tablet Take 250 mg by mouth daily.     vitamin E 1000 UNIT capsule Take 1,000 Units by mouth daily.     No current facility-administered medications for this visit.    PHYSICAL EXAMINATION: ECOG PERFORMANCE STATUS: 1 - Symptomatic but completely ambulatory  Vitals:   12/16/20 1230  BP: 128/81  Pulse: 71  Resp: 18  Temp: (!) 97.5 F (36.4 C)  SpO2: 100%   Filed Weights   12/16/20 1230  Weight: 214 lb 11.2 oz (97.4 kg)      LABORATORY DATA:  I have reviewed the data as listed CMP Latest Ref Rng & Units 10/30/2020 10/08/2020  Glucose 70 - 99 mg/dL 95 116(H)  BUN 6 - 20 mg/dL 12 14  Creatinine 0.44 - 1.00 mg/dL 0.67 0.72  Sodium 135 - 145 mmol/L 142 139  Potassium 3.5 - 5.1 mmol/L 4.0 3.8  Chloride 98 - 111 mmol/L 110 105  CO2 22 - 32 mmol/L 25 25  Calcium 8.9 - 10.3 mg/dL 9.8 9.5  Total Protein 6.5 - 8.1 g/dL 7.6 6.8  Total Bilirubin 0.3 - 1.2 mg/dL 0.4 0.3  Alkaline Phos 38 - 126 U/L 46 -  AST 15 - 41 U/L 17 16  ALT 0 - 44 U/L 19 14    Lab Results  Component Value Date   WBC 8.9 10/30/2020   HGB 13.1 10/30/2020   HCT 38.6 10/30/2020   MCV 87.1 10/30/2020   PLT 291 10/30/2020   NEUTROABS 5.2 10/30/2020    ASSESSMENT & PLAN:  Malignant neoplasm of upper-outer quadrant of left breast in female, estrogen receptor positive ([place]) 11/29/2020: 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 I discussed the margins.  We also discussed the final staging along with previously performed ER/PR testing.  Treatment plan: 1. Oncotype DX: Low risk, no role of chemo 2. Adjuvant radiation therapy followed  by 3. Adjuvant antiestrogen therapy with tamoxifen 20 mg daily x10 years  Tamoxifen counseling: We discussed the risks and benefits of tamoxifen. These include but not limited to insomnia, hot flashes, mood changes, vaginal dryness, and weight gain. Although rare, serious side effects including endometrial cancer, risk of blood clots were also discussed. We strongly believe that the benefits far outweigh the risks. Patient understands these risks and consented to starting treatment. Planned treatment duration is 10 years.   Return to clinic after radiation is complete.  She is going to get radiation with Dr. Donella Stade    No orders of the defined types were placed in this encounter.  The patient has a good understanding of the overall plan. she agrees with it. she will call with any problems that may develop before the next visit here. Total time spent: 30 mins including face to face time and time spent for planning, charting and co-ordination of care   Gabriela Ohara, MD  12/16/20     

## 2020-12-17 ENCOUNTER — Encounter: Payer: Self-pay | Admitting: *Deleted

## 2020-12-17 ENCOUNTER — Telehealth: Payer: Self-pay | Admitting: *Deleted

## 2020-12-17 ENCOUNTER — Telehealth: Payer: Self-pay | Admitting: Radiation Oncology

## 2020-12-17 ENCOUNTER — Other Ambulatory Visit: Payer: Self-pay | Admitting: *Deleted

## 2020-12-17 ENCOUNTER — Encounter: Payer: Self-pay | Admitting: Hematology and Oncology

## 2020-12-17 DIAGNOSIS — Z170 Estrogen receptor positive status [ER+]: Secondary | ICD-10-CM

## 2020-12-17 DIAGNOSIS — C50412 Malignant neoplasm of upper-outer quadrant of left female breast: Secondary | ICD-10-CM

## 2020-12-17 NOTE — Telephone Encounter (Signed)
Received oncotype score of 20. Physician team aware

## 2020-12-17 NOTE — Telephone Encounter (Addendum)
We received a message that the patient desires to reestablish herself with Dr. Lisbeth Renshaw for radiation.  I called and reviewed her pathology report.  Fortunately she has clear margins and all of her sampled lymph nodes were negative for metastatic disease. Her Oncotype Dx was 20 and no chemotherapy is planned.  Dr. Lisbeth Renshaw had previously offered her a course of 6-1/2 weeks of radiotherapy to the left breast.  We reviewed the delivery and logistics of treatment as well as the short and long-term effects of radiotherapy as well as the treatment over 6-1/2 weeks with deep inspiration breath-hold technique to avoid the heart.  Our staff will contact her to coordinate simulation.  She is comfortable with this and desires to forego the formality of a follow up visit since we are having this phone call today she will sign written consent at the time of simulation to proceed.

## 2020-12-18 ENCOUNTER — Ambulatory Visit: Payer: Managed Care, Other (non HMO)

## 2020-12-23 ENCOUNTER — Encounter: Payer: Self-pay | Admitting: *Deleted

## 2020-12-25 ENCOUNTER — Ambulatory Visit: Payer: Managed Care, Other (non HMO)

## 2020-12-26 ENCOUNTER — Ambulatory Visit: Payer: Managed Care, Other (non HMO)

## 2020-12-27 ENCOUNTER — Ambulatory Visit: Payer: Managed Care, Other (non HMO)

## 2020-12-30 ENCOUNTER — Ambulatory Visit: Payer: Managed Care, Other (non HMO)

## 2020-12-31 ENCOUNTER — Ambulatory Visit: Payer: Managed Care, Other (non HMO)

## 2021-01-01 ENCOUNTER — Other Ambulatory Visit: Payer: Self-pay

## 2021-01-01 ENCOUNTER — Encounter: Payer: Self-pay | Admitting: Radiation Oncology

## 2021-01-01 ENCOUNTER — Ambulatory Visit: Payer: Managed Care, Other (non HMO)

## 2021-01-01 ENCOUNTER — Ambulatory Visit
Admission: RE | Admit: 2021-01-01 | Discharge: 2021-01-01 | Disposition: A | Discharge status: Home / Self Care | Payer: Managed Care, Other (non HMO) | Source: Ambulatory Visit | Attending: Radiation Oncology | Admitting: Radiation Oncology

## 2021-01-01 DIAGNOSIS — C50412 Malignant neoplasm of upper-outer quadrant of left female breast: Secondary | ICD-10-CM | POA: Insufficient documentation

## 2021-01-01 DIAGNOSIS — Z170 Estrogen receptor positive status [ER+]: Secondary | ICD-10-CM | POA: Insufficient documentation

## 2021-01-01 DIAGNOSIS — Z510 Encounter for antineoplastic radiation therapy: Secondary | ICD-10-CM | POA: Diagnosis present

## 2021-01-01 NOTE — Progress Notes (Signed)
I saw the patient in simulation to consent her and review her incision. She has well healing left breast and axillary incisions. She has mild bruising in the periareolar region laterally, and some induration deep to the incision more laterally as well with some fluctuance but no definite pocket of fluid. She does have mild fullness in her left axilla and some cording as well. I think we can still proceed with simulation today as she has noticed improvement in the fullness over the last week or so, and she will continue to wear a compression bra and try the wedge insert lower down to try to improve this as well.

## 2021-01-02 ENCOUNTER — Ambulatory Visit: Payer: Managed Care, Other (non HMO)

## 2021-01-03 ENCOUNTER — Ambulatory Visit: Payer: Managed Care, Other (non HMO)

## 2021-01-03 ENCOUNTER — Encounter: Payer: Self-pay | Admitting: Hematology and Oncology

## 2021-01-06 ENCOUNTER — Ambulatory Visit: Payer: Managed Care, Other (non HMO)

## 2021-01-07 ENCOUNTER — Other Ambulatory Visit: Payer: Self-pay

## 2021-01-07 ENCOUNTER — Encounter: Payer: Self-pay | Admitting: *Deleted

## 2021-01-07 ENCOUNTER — Ambulatory Visit
Admission: RE | Admit: 2021-01-07 | Discharge: 2021-01-07 | Disposition: A | Discharge status: Home / Self Care | Payer: Managed Care, Other (non HMO) | Source: Ambulatory Visit | Attending: General Surgery | Admitting: General Surgery

## 2021-01-07 ENCOUNTER — Other Ambulatory Visit: Payer: Self-pay | Admitting: General Surgery

## 2021-01-07 ENCOUNTER — Ambulatory Visit: Payer: Managed Care, Other (non HMO)

## 2021-01-07 DIAGNOSIS — M542 Cervicalgia: Secondary | ICD-10-CM

## 2021-01-07 IMAGING — MR MR CERVICAL SPINE WO/W CM
5 of 8 series · 28 of 48 positions shown · IV contrast (gadavist)
Comparison: None available.

CLINICAL DATA: Initial evaluation for severe left upper extremity
numbness and tingling since left breast lumpectomy on [DATE],
particularly in fourth and fifth digits.

EXAM:
MRI CERVICAL SPINE WITHOUT AND WITH CONTRAST
TECHNIQUE: Multiplanar and multiecho pulse sequences of the cervical spine, to
include the craniocervical junction and cervicothoracic junction,
were obtained without and with intravenous contrast.
CONTRAST:  10mL GADAVIST GADOBUTROL 1 MMOL/ML IV SOLN

[Series 2: T1 · sagittal · 3.0mm · 0.41mm/px · 4 of 15 slices shown (1 of 2)]
[im 1/15]
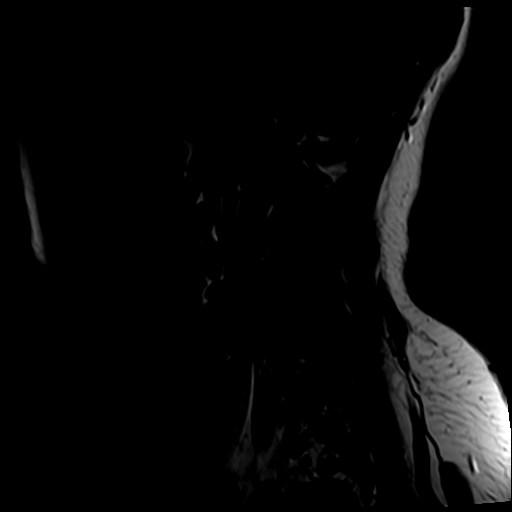
[im 5/15]
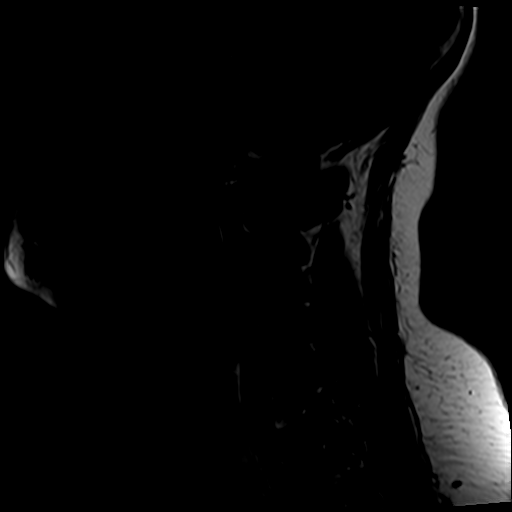
[im 10/15]
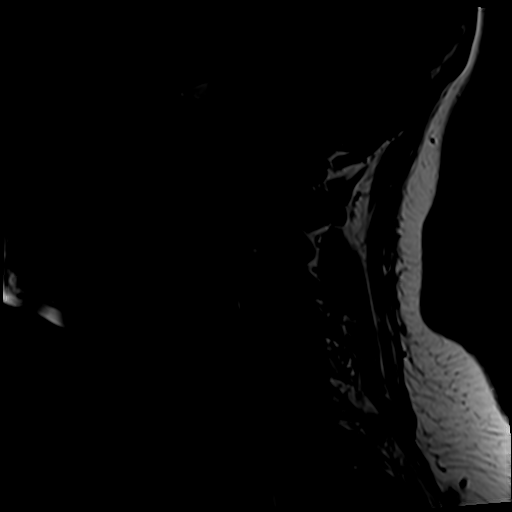
[im 15/15]
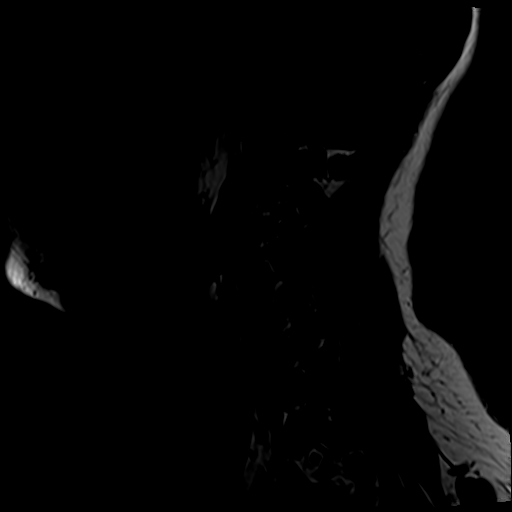

[Series 5: T2 · axial · 3.0mm · 0.70mm/px · z∈[-40,+57]mm · 8 of 27 slices shown (1 of 2)]
[im 1/27]
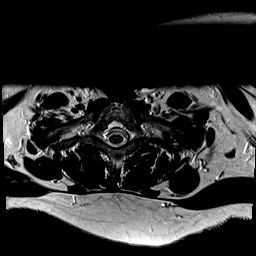
[im 4/27]
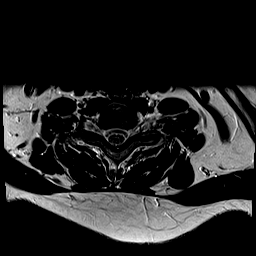
[im 8/27]
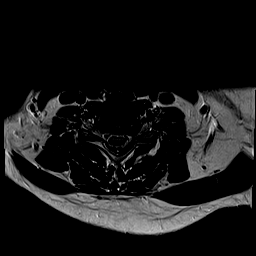
[im 12/27]
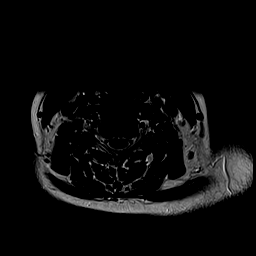
[im 15/27]
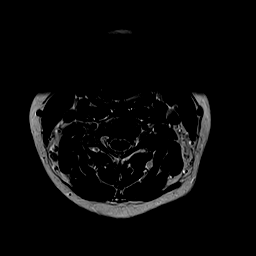
[im 19/27]
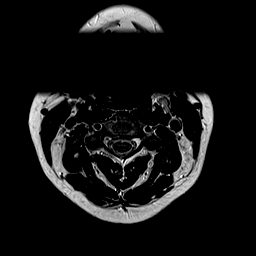
[im 23/27]
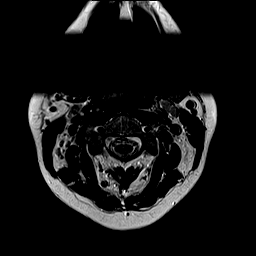
[im 27/27]
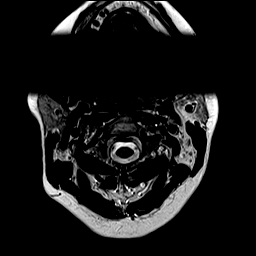

[Series 6: T1 · axial · non-contrast · 3.0mm · 0.35mm/px · z∈[-40,+57]mm · 8 of 27 slices shown (2 of 2)]
[im 1/27]
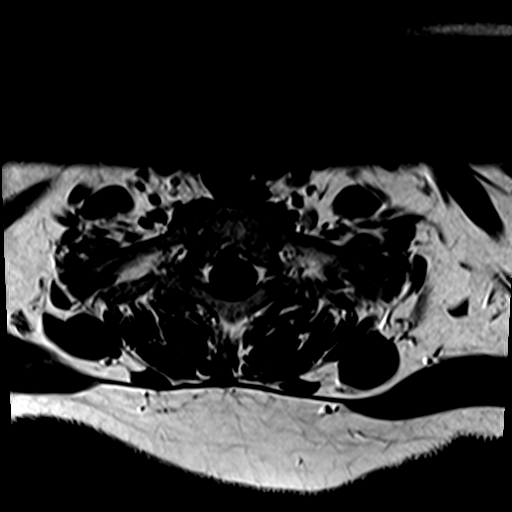
[im 4/27]
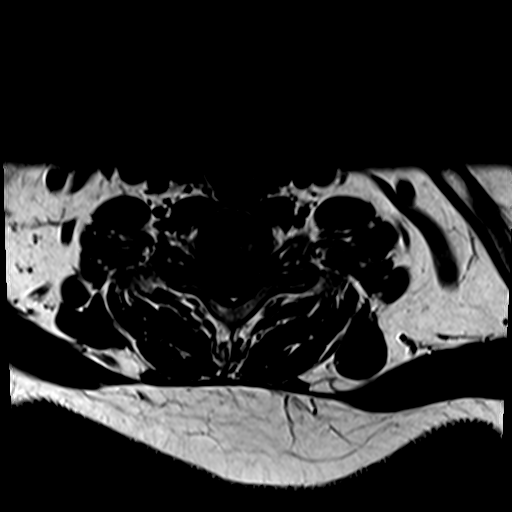
[im 8/27]
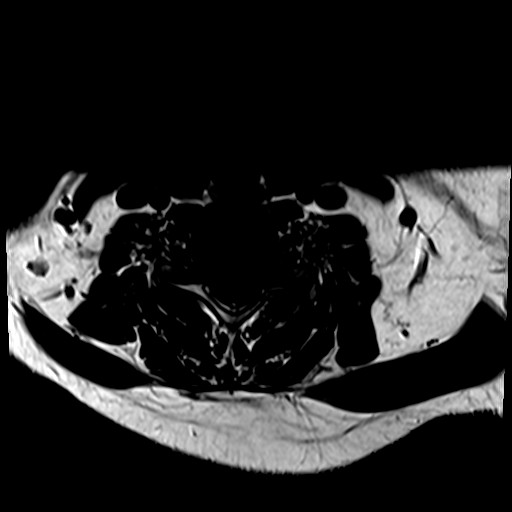
[im 12/27]
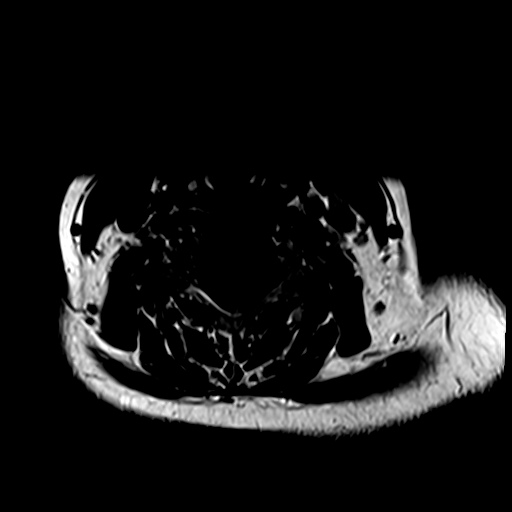
[im 15/27]
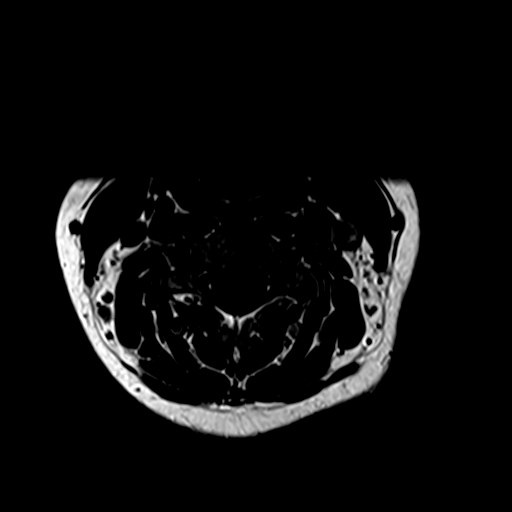
[im 19/27]
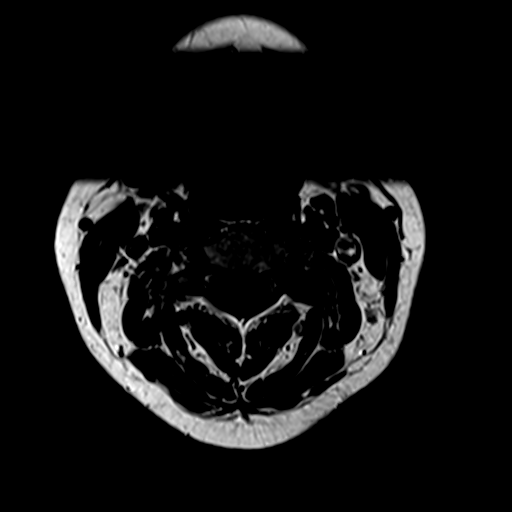
[im 23/27]
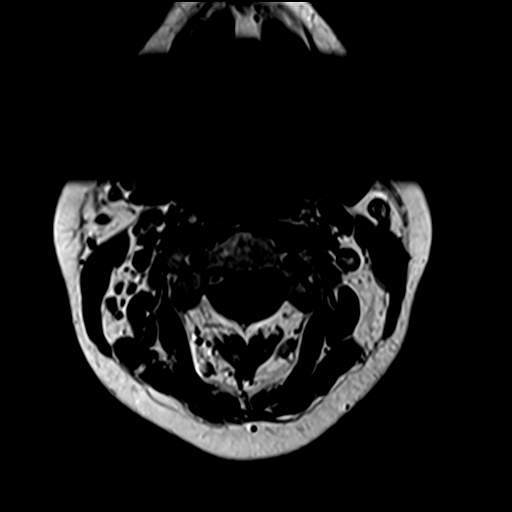
[im 27/27]
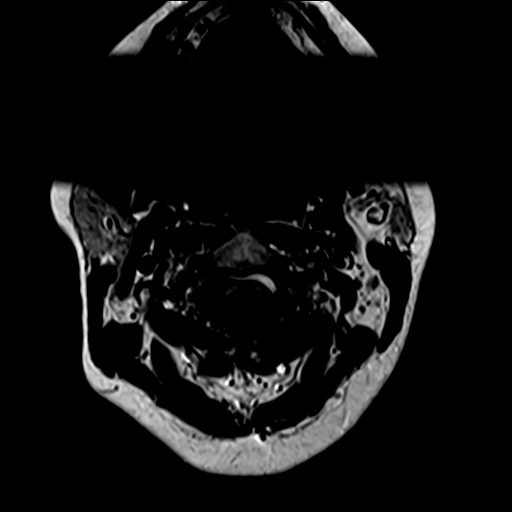

[Series 7: T2 · sagittal · 3.0mm · 0.66mm/px · 4 of 15 slices shown (2 of 2)]
[im 1/15]
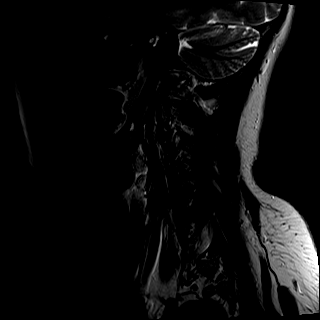
[im 5/15]
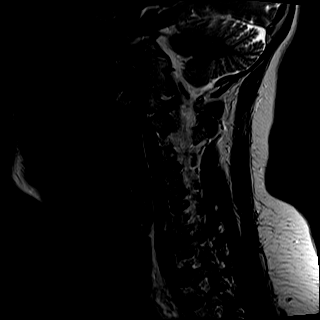
[im 10/15]
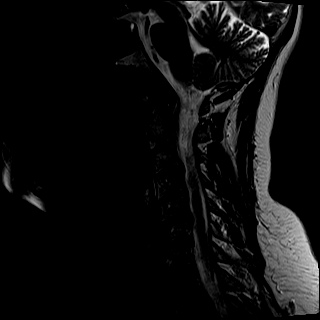
[im 15/15]
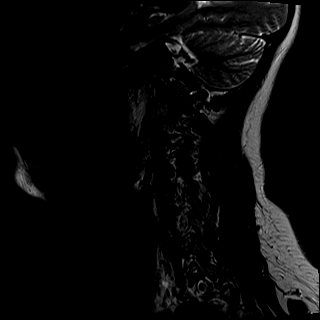

[Series 9: T1 fat-sat post-contrast · sagittal · 3.0mm · 0.82mm/px · 4 of 15 slices shown]
[im 1/15]
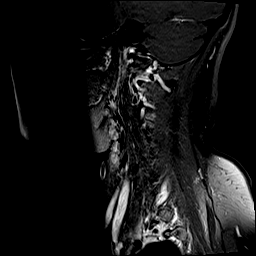
[im 5/15]
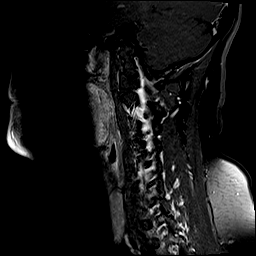
[im 10/15]
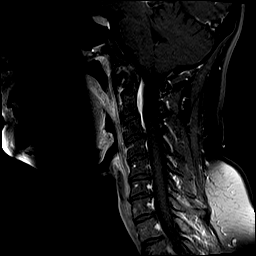
[im 15/15]
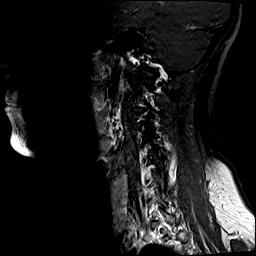

[28 of 48 positions shown; findings below may reference images not displayed]

FINDINGS: Alignment: Straightening with mild reversal of the normal cervical
lordosis. No listhesis.

Vertebrae: Vertebral body height maintained without acute or chronic
fracture. Bone marrow signal intensity within normal limits. No
discrete or worrisome osseous lesions. No evidence for metastatic
disease. No abnormal marrow edema or enhancement.

Cord: Normal signal and morphology.  No abnormal enhancement.

Posterior Fossa, vertebral arteries, paraspinal tissues: Visualized
brain and posterior fossa within normal limits. Craniocervical
junction normal. Paraspinous and prevertebral soft tissues within
normal limits. Normal intravascular flow voids seen within the
vertebral arteries bilaterally.

Disc levels:

C2-C3: Unremarkable.

C3-C4: Mild left greater than right uncovertebral spurring without
significant disc bulge. No spinal stenosis. Foramina remain patent.

C4-C5: Mild left eccentric disc bulge with bilateral uncovertebral
spurring. Mild flattening of the ventral thecal sac without
significant spinal stenosis or cord deformity. Mild left C5
foraminal narrowing. Right neural foramina remains patent.

C5-C6: Mild disc bulge with right-sided uncovertebral spurring. No
significant spinal stenosis. Mild right C6 foraminal narrowing. Left
neural foramina remains patent.

C6-C7: Mild degenerative intervertebral disc space narrowing.
Diffuse disc bulge with associated endplate and uncovertebral
spurring, worse on the left. Flattening of the ventral thecal sac
without significant spinal stenosis or cord deformity. Mild right
with moderate to severe left C7 foraminal stenosis. Finding could
contribute to left upper extremity radicular symptoms.

C7-T1: Normal interspace. Minimal facet hypertrophy. No canal or
foraminal stenosis.

Visualized upper thoracic spine demonstrates no significant finding.
IMPRESSION: 1. Left eccentric disc bulge with uncovertebral spurring at C6-7
with resultant moderate to severe left C7 foraminal stenosis.
Finding could contribute to left upper extremity radicular symptoms.
2. Mild disc bulge with uncovertebral disease at C4-5 and C5-6 with
resultant mild left C5 and right C6 foraminal stenosis. No
significant spinal stenosis within the cervical spine.
3. No evidence for metastatic disease.

## 2021-01-07 MED ORDER — GADOBUTROL 1 MMOL/ML IV SOLN
10.0000 mL | Freq: Once | INTRAVENOUS | Status: AC | PRN
  Administered 2021-01-07: 18:00:00 10 mL via INTRAVENOUS

## 2021-01-08 ENCOUNTER — Ambulatory Visit: Payer: Managed Care, Other (non HMO)

## 2021-01-09 ENCOUNTER — Ambulatory Visit: Payer: Managed Care, Other (non HMO)

## 2021-01-09 ENCOUNTER — Telehealth: Payer: Self-pay

## 2021-01-09 NOTE — Telephone Encounter (Signed)
Patient notified of completion of Critical Illness Claim Form. Fax Transmission Confirmation received from [employer]. Copy of Form placed at Registration Desk for Pick-up as requested

## 2021-01-10 ENCOUNTER — Ambulatory Visit: Payer: Managed Care, Other (non HMO)

## 2021-01-13 DIAGNOSIS — C50412 Malignant neoplasm of upper-outer quadrant of left female breast: Secondary | ICD-10-CM | POA: Diagnosis not present

## 2021-01-14 ENCOUNTER — Ambulatory Visit: Payer: Managed Care, Other (non HMO)

## 2021-01-14 ENCOUNTER — Other Ambulatory Visit: Payer: Self-pay

## 2021-01-14 ENCOUNTER — Telehealth: Payer: Self-pay | Admitting: Hematology and Oncology

## 2021-01-14 ENCOUNTER — Ambulatory Visit
Admission: RE | Admit: 2021-01-14 | Discharge: 2021-01-14 | Disposition: A | Discharge status: Home / Self Care | Payer: Managed Care, Other (non HMO) | Source: Ambulatory Visit | Attending: Radiation Oncology | Admitting: Radiation Oncology

## 2021-01-14 DIAGNOSIS — C50412 Malignant neoplasm of upper-outer quadrant of left female breast: Secondary | ICD-10-CM | POA: Diagnosis not present

## 2021-01-14 NOTE — Telephone Encounter (Signed)
Scheduled appointment per 12/20 scheduling message. Patient is aware. °

## 2021-01-15 ENCOUNTER — Ambulatory Visit: Payer: Managed Care, Other (non HMO)

## 2021-01-15 ENCOUNTER — Ambulatory Visit
Admission: RE | Admit: 2021-01-15 | Discharge: 2021-01-15 | Disposition: A | Discharge status: Home / Self Care | Payer: Managed Care, Other (non HMO) | Source: Ambulatory Visit | Attending: Radiation Oncology | Admitting: Radiation Oncology

## 2021-01-15 DIAGNOSIS — C50412 Malignant neoplasm of upper-outer quadrant of left female breast: Secondary | ICD-10-CM | POA: Diagnosis not present

## 2021-01-16 ENCOUNTER — Ambulatory Visit
Admission: RE | Admit: 2021-01-16 | Discharge: 2021-01-16 | Disposition: A | Discharge status: Home / Self Care | Payer: Managed Care, Other (non HMO) | Source: Ambulatory Visit | Attending: Radiation Oncology | Admitting: Radiation Oncology

## 2021-01-16 ENCOUNTER — Ambulatory Visit: Payer: Managed Care, Other (non HMO)

## 2021-01-16 ENCOUNTER — Other Ambulatory Visit: Payer: Self-pay

## 2021-01-16 DIAGNOSIS — C50412 Malignant neoplasm of upper-outer quadrant of left female breast: Secondary | ICD-10-CM | POA: Diagnosis not present

## 2021-01-17 ENCOUNTER — Ambulatory Visit: Payer: Managed Care, Other (non HMO)

## 2021-01-17 ENCOUNTER — Ambulatory Visit
Admission: RE | Admit: 2021-01-17 | Discharge: 2021-01-17 | Disposition: A | Discharge status: Home / Self Care | Payer: Managed Care, Other (non HMO) | Source: Ambulatory Visit | Attending: Radiation Oncology | Admitting: Radiation Oncology

## 2021-01-17 DIAGNOSIS — C50412 Malignant neoplasm of upper-outer quadrant of left female breast: Secondary | ICD-10-CM | POA: Diagnosis not present

## 2021-01-17 DIAGNOSIS — Z170 Estrogen receptor positive status [ER+]: Secondary | ICD-10-CM

## 2021-01-17 MED ORDER — ALRA NON-METALLIC DEODORANT (RAD-ONC)
1.0000 "application " | Freq: Once | TOPICAL | Status: AC
  Administered 2021-01-17: 1 via TOPICAL

## 2021-01-17 MED ORDER — RADIAPLEXRX EX GEL
1.0000 "application " | Freq: Once | CUTANEOUS | Status: AC
  Administered 2021-01-17: 1 via TOPICAL

## 2021-01-21 ENCOUNTER — Ambulatory Visit: Payer: Managed Care, Other (non HMO)

## 2021-01-21 ENCOUNTER — Ambulatory Visit

## 2021-01-21 ENCOUNTER — Other Ambulatory Visit: Payer: Self-pay

## 2021-01-21 ENCOUNTER — Ambulatory Visit
Admission: RE | Admit: 2021-01-21 | Discharge: 2021-01-21 | Disposition: A | Discharge status: Home / Self Care | Source: Ambulatory Visit | Attending: Radiation Oncology | Admitting: Radiation Oncology

## 2021-01-21 ENCOUNTER — Encounter: Payer: Self-pay | Admitting: Radiation Oncology

## 2021-01-21 DIAGNOSIS — C50412 Malignant neoplasm of upper-outer quadrant of left female breast: Secondary | ICD-10-CM | POA: Insufficient documentation

## 2021-01-21 DIAGNOSIS — Z510 Encounter for antineoplastic radiation therapy: Secondary | ICD-10-CM | POA: Diagnosis present

## 2021-01-21 DIAGNOSIS — Z170 Estrogen receptor positive status [ER+]: Secondary | ICD-10-CM | POA: Insufficient documentation

## 2021-01-22 ENCOUNTER — Telehealth: Payer: Self-pay

## 2021-01-22 ENCOUNTER — Ambulatory Visit
Admission: RE | Admit: 2021-01-22 | Discharge: 2021-01-22 | Disposition: A | Discharge status: Home / Self Care | Source: Ambulatory Visit | Attending: Radiation Oncology | Admitting: Radiation Oncology

## 2021-01-22 ENCOUNTER — Ambulatory Visit: Payer: Managed Care, Other (non HMO)

## 2021-01-22 ENCOUNTER — Ambulatory Visit

## 2021-01-22 DIAGNOSIS — Z510 Encounter for antineoplastic radiation therapy: Secondary | ICD-10-CM | POA: Diagnosis not present

## 2021-01-22 NOTE — Telephone Encounter (Signed)
Notified Patient of completion of FMLA forms. Fax transmission confirmation received and copy mailed to Patient as requested 

## 2021-01-23 ENCOUNTER — Ambulatory Visit: Payer: Managed Care, Other (non HMO)

## 2021-01-23 ENCOUNTER — Other Ambulatory Visit: Payer: Self-pay

## 2021-01-23 ENCOUNTER — Ambulatory Visit

## 2021-01-23 ENCOUNTER — Ambulatory Visit
Admission: RE | Admit: 2021-01-23 | Discharge: 2021-01-23 | Disposition: A | Discharge status: Home / Self Care | Source: Ambulatory Visit | Attending: Radiation Oncology | Admitting: Radiation Oncology

## 2021-01-23 DIAGNOSIS — Z510 Encounter for antineoplastic radiation therapy: Secondary | ICD-10-CM | POA: Diagnosis not present

## 2021-01-24 ENCOUNTER — Ambulatory Visit

## 2021-01-24 ENCOUNTER — Ambulatory Visit
Admission: RE | Admit: 2021-01-24 | Discharge: 2021-01-24 | Disposition: A | Discharge status: Home / Self Care | Source: Ambulatory Visit | Attending: Radiation Oncology | Admitting: Radiation Oncology

## 2021-01-24 ENCOUNTER — Ambulatory Visit: Payer: Managed Care, Other (non HMO)

## 2021-01-24 DIAGNOSIS — Z510 Encounter for antineoplastic radiation therapy: Secondary | ICD-10-CM | POA: Diagnosis not present

## 2021-01-27 ENCOUNTER — Ambulatory Visit
Admission: RE | Admit: 2021-01-27 | Discharge: 2021-01-27 | Disposition: A | Discharge status: Home / Self Care | Source: Ambulatory Visit | Attending: Radiation Oncology | Admitting: Radiation Oncology

## 2021-01-27 ENCOUNTER — Ambulatory Visit

## 2021-01-27 ENCOUNTER — Ambulatory Visit: Payer: Managed Care, Other (non HMO)

## 2021-01-27 ENCOUNTER — Other Ambulatory Visit: Payer: Self-pay

## 2021-01-27 DIAGNOSIS — Z510 Encounter for antineoplastic radiation therapy: Secondary | ICD-10-CM | POA: Diagnosis not present

## 2021-01-28 ENCOUNTER — Ambulatory Visit

## 2021-01-28 ENCOUNTER — Ambulatory Visit
Admission: RE | Admit: 2021-01-28 | Discharge: 2021-01-28 | Disposition: A | Discharge status: Home / Self Care | Source: Ambulatory Visit | Attending: Radiation Oncology | Admitting: Radiation Oncology

## 2021-01-28 DIAGNOSIS — Z510 Encounter for antineoplastic radiation therapy: Secondary | ICD-10-CM | POA: Diagnosis not present

## 2021-01-29 ENCOUNTER — Other Ambulatory Visit: Payer: Self-pay

## 2021-01-29 ENCOUNTER — Ambulatory Visit
Admission: RE | Admit: 2021-01-29 | Discharge: 2021-01-29 | Disposition: A | Discharge status: Home / Self Care | Source: Ambulatory Visit | Attending: Radiation Oncology | Admitting: Radiation Oncology

## 2021-01-29 ENCOUNTER — Ambulatory Visit

## 2021-01-29 DIAGNOSIS — Z510 Encounter for antineoplastic radiation therapy: Secondary | ICD-10-CM | POA: Diagnosis not present

## 2021-01-30 ENCOUNTER — Ambulatory Visit

## 2021-01-30 ENCOUNTER — Ambulatory Visit
Admission: RE | Admit: 2021-01-30 | Discharge: 2021-01-30 | Disposition: A | Discharge status: Home / Self Care | Source: Ambulatory Visit | Attending: Radiation Oncology | Admitting: Radiation Oncology

## 2021-01-30 DIAGNOSIS — Z510 Encounter for antineoplastic radiation therapy: Secondary | ICD-10-CM | POA: Diagnosis not present

## 2021-01-31 ENCOUNTER — Ambulatory Visit
Admission: RE | Admit: 2021-01-31 | Discharge: 2021-01-31 | Disposition: A | Discharge status: Home / Self Care | Source: Ambulatory Visit | Attending: Radiation Oncology | Admitting: Radiation Oncology

## 2021-01-31 ENCOUNTER — Ambulatory Visit: Admitting: Radiation Oncology

## 2021-01-31 ENCOUNTER — Telehealth: Payer: Self-pay

## 2021-01-31 ENCOUNTER — Other Ambulatory Visit: Payer: Self-pay | Admitting: Radiation Oncology

## 2021-01-31 ENCOUNTER — Ambulatory Visit

## 2021-01-31 ENCOUNTER — Other Ambulatory Visit: Payer: Self-pay

## 2021-01-31 DIAGNOSIS — Z510 Encounter for antineoplastic radiation therapy: Secondary | ICD-10-CM | POA: Diagnosis not present

## 2021-01-31 DIAGNOSIS — Z170 Estrogen receptor positive status [ER+]: Secondary | ICD-10-CM

## 2021-01-31 DIAGNOSIS — C50412 Malignant neoplasm of upper-outer quadrant of left female breast: Secondary | ICD-10-CM

## 2021-01-31 MED ORDER — RADIAPLEXRX EX GEL: Freq: Once | CUTANEOUS | Status: AC

## 2021-01-31 NOTE — Telephone Encounter (Signed)
Received verbal orders for magic mouth wash from Dr. Lisbeth Renshaw. Unable to reach pharmacist at patient's [facility] in [facility] after being on hold for over 1.5 hours. Tried [facility] in [place], but was told they no longer make MMW. Then called [facility] in [place] and was told they only compound medications during the week day, and they have stopped compounding for the day, so the soonest they would have prescription ready for patient would be next Tuesday 02/04/21. Was advised to try [facility] in [place]. [facility] store and spoke with pharmacist who confirmed they would be able to compound medication, and took verbal order.   Called patient with an update and provided her with [facility]'s number and address. Advised her to let staff know Monday if she was not successful in getting MMW from [place], and we would look into transferring prescription to a pharmacy here in [place] while she's in the area for her radiation treatment. Patient verbalized understanding and appreciation of call. No other needs identified at this time

## 2021-02-03 ENCOUNTER — Ambulatory Visit
Admission: RE | Admit: 2021-02-03 | Discharge: 2021-02-03 | Disposition: A | Discharge status: Home / Self Care | Source: Ambulatory Visit | Attending: Radiation Oncology | Admitting: Radiation Oncology

## 2021-02-03 ENCOUNTER — Ambulatory Visit

## 2021-02-03 ENCOUNTER — Other Ambulatory Visit: Payer: Self-pay

## 2021-02-03 DIAGNOSIS — Z510 Encounter for antineoplastic radiation therapy: Secondary | ICD-10-CM | POA: Diagnosis not present

## 2021-02-04 ENCOUNTER — Ambulatory Visit

## 2021-02-04 ENCOUNTER — Ambulatory Visit
Admission: RE | Admit: 2021-02-04 | Discharge: 2021-02-04 | Disposition: A | Discharge status: Home / Self Care | Source: Ambulatory Visit | Attending: Radiation Oncology | Admitting: Radiation Oncology

## 2021-02-04 DIAGNOSIS — Z510 Encounter for antineoplastic radiation therapy: Secondary | ICD-10-CM | POA: Diagnosis not present

## 2021-02-05 ENCOUNTER — Ambulatory Visit

## 2021-02-05 ENCOUNTER — Other Ambulatory Visit: Payer: Self-pay

## 2021-02-05 ENCOUNTER — Ambulatory Visit
Admission: RE | Admit: 2021-02-05 | Discharge: 2021-02-05 | Disposition: A | Discharge status: Home / Self Care | Source: Ambulatory Visit | Attending: Radiation Oncology | Admitting: Radiation Oncology

## 2021-02-05 DIAGNOSIS — Z510 Encounter for antineoplastic radiation therapy: Secondary | ICD-10-CM | POA: Diagnosis not present

## 2021-02-06 ENCOUNTER — Ambulatory Visit

## 2021-02-06 ENCOUNTER — Ambulatory Visit
Admission: RE | Admit: 2021-02-06 | Discharge: 2021-02-06 | Disposition: A | Discharge status: Home / Self Care | Source: Ambulatory Visit | Attending: Radiation Oncology | Admitting: Radiation Oncology

## 2021-02-06 DIAGNOSIS — Z510 Encounter for antineoplastic radiation therapy: Secondary | ICD-10-CM | POA: Diagnosis not present

## 2021-02-07 ENCOUNTER — Ambulatory Visit

## 2021-02-07 ENCOUNTER — Other Ambulatory Visit: Payer: Self-pay

## 2021-02-07 ENCOUNTER — Ambulatory Visit
Admission: RE | Admit: 2021-02-07 | Discharge: 2021-02-07 | Disposition: A | Discharge status: Home / Self Care | Source: Ambulatory Visit | Attending: Radiation Oncology | Admitting: Radiation Oncology

## 2021-02-07 DIAGNOSIS — Z510 Encounter for antineoplastic radiation therapy: Secondary | ICD-10-CM | POA: Diagnosis not present

## 2021-02-10 ENCOUNTER — Other Ambulatory Visit: Payer: Self-pay

## 2021-02-10 ENCOUNTER — Ambulatory Visit
Admission: RE | Admit: 2021-02-10 | Discharge: 2021-02-10 | Disposition: A | Discharge status: Home / Self Care | Source: Ambulatory Visit | Attending: Radiation Oncology | Admitting: Radiation Oncology

## 2021-02-10 ENCOUNTER — Ambulatory Visit

## 2021-02-10 DIAGNOSIS — Z510 Encounter for antineoplastic radiation therapy: Secondary | ICD-10-CM | POA: Diagnosis not present

## 2021-02-11 ENCOUNTER — Ambulatory Visit

## 2021-02-11 ENCOUNTER — Ambulatory Visit
Admission: RE | Admit: 2021-02-11 | Discharge: 2021-02-11 | Disposition: A | Discharge status: Home / Self Care | Source: Ambulatory Visit | Attending: Radiation Oncology | Admitting: Radiation Oncology

## 2021-02-11 DIAGNOSIS — Z510 Encounter for antineoplastic radiation therapy: Secondary | ICD-10-CM | POA: Diagnosis not present

## 2021-02-12 ENCOUNTER — Ambulatory Visit

## 2021-02-12 ENCOUNTER — Encounter: Payer: Self-pay | Admitting: Radiation Oncology

## 2021-02-13 ENCOUNTER — Other Ambulatory Visit: Payer: Self-pay

## 2021-02-13 ENCOUNTER — Ambulatory Visit
Admission: RE | Admit: 2021-02-13 | Discharge: 2021-02-13 | Disposition: A | Discharge status: Home / Self Care | Source: Ambulatory Visit | Attending: Radiation Oncology | Admitting: Radiation Oncology

## 2021-02-13 DIAGNOSIS — Z510 Encounter for antineoplastic radiation therapy: Secondary | ICD-10-CM | POA: Diagnosis not present

## 2021-02-14 ENCOUNTER — Other Ambulatory Visit: Payer: Self-pay

## 2021-02-14 ENCOUNTER — Ambulatory Visit
Admission: RE | Admit: 2021-02-14 | Discharge: 2021-02-14 | Disposition: A | Discharge status: Home / Self Care | Source: Ambulatory Visit | Attending: Radiation Oncology | Admitting: Radiation Oncology

## 2021-02-14 DIAGNOSIS — Z510 Encounter for antineoplastic radiation therapy: Secondary | ICD-10-CM | POA: Diagnosis not present

## 2021-02-15 ENCOUNTER — Encounter: Payer: Self-pay | Admitting: Hematology and Oncology

## 2021-02-16 DIAGNOSIS — Z510 Encounter for antineoplastic radiation therapy: Secondary | ICD-10-CM | POA: Diagnosis not present

## 2021-02-16 NOTE — Progress Notes (Signed)
°  Radiation Oncology         [phone]) [phone] ________________________________  Name: Gabriela Evans MRN: [id number]  Date: 02/12/2021  DOB: Dec 05, 1980  SIMULATION NOTE   NARRATIVE:  The patient underwent simulation today for ongoing radiation therapy.  The existing CT study set was employed for the purpose of virtual treatment planning.  The target and avoidance structures were reviewed and modified as necessary.  Treatment planning then occurred.  The radiation boost prescription was entered and confirmed.  A total of 3 complex treatment devices were fabricated in the form of multi-leaf collimators to shape radiation around the targets while maximally excluding nearby normal structures. I have requested : Isodose Plan.    PLAN:  This modified radiation beam arrangement is intended to continue the current radiation dose to an additional 10 Gy in 5 fractions for a total cumulative dose of 60.4 Gy.    ------------------------------------------------  Jodelle Gross, MD, PhD

## 2021-02-17 ENCOUNTER — Other Ambulatory Visit: Payer: Self-pay

## 2021-02-17 ENCOUNTER — Inpatient Hospital Stay: Attending: Hematology and Oncology | Admitting: Adult Health

## 2021-02-17 ENCOUNTER — Ambulatory Visit
Admission: RE | Admit: 2021-02-17 | Discharge: 2021-02-17 | Disposition: A | Discharge status: Home / Self Care | Source: Ambulatory Visit | Attending: Radiation Oncology | Admitting: Radiation Oncology

## 2021-02-17 ENCOUNTER — Encounter: Payer: Self-pay | Admitting: General Practice

## 2021-02-17 ENCOUNTER — Encounter: Payer: Self-pay | Admitting: Adult Health

## 2021-02-17 VITALS — BP 131/77 | HR 74 | Temp 97.7°F | Resp 17 | Wt 217.0 lb

## 2021-02-17 DIAGNOSIS — C50412 Malignant neoplasm of upper-outer quadrant of left female breast: Secondary | ICD-10-CM | POA: Diagnosis not present

## 2021-02-17 DIAGNOSIS — Z170 Estrogen receptor positive status [ER+]: Secondary | ICD-10-CM | POA: Insufficient documentation

## 2021-02-17 DIAGNOSIS — Z510 Encounter for antineoplastic radiation therapy: Secondary | ICD-10-CM | POA: Insufficient documentation

## 2021-02-17 MED ORDER — VENLAFAXINE HCL ER 37.5 MG PO CP24: 37.5000 mg | ORAL_CAPSULE | Freq: Every day | ORAL | 0 refills | Status: DC

## 2021-02-17 NOTE — Progress Notes (Signed)
[facility] Follow up:    Patient, [name] No address on file   DIAGNOSIS:  Cancer Staging  Malignant neoplasm of upper-outer quadrant of left breast in female, estrogen receptor positive ([place]) Staging form: Breast, AJCC 8th Edition - Clinical stage from 10/30/2020: Stage IA (cT1b, cN0, cM0, G2, ER+, PR+, HER2-) - Unsigned Stage prefix: Initial diagnosis Histologic grading system: 3 grade system   SUMMARY OF ONCOLOGIC HISTORY: Oncology History  Malignant neoplasm of upper-outer quadrant of left breast in female, estrogen receptor positive ([place])  10/28/2020 Initial Diagnosis   Presented with new focal thickening and pain her outer left breast. Diagnostic mammogram and [place]: indeterminate mass in the left breast at 3:00. 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  Breast MRI: 1 prominent lymph node and 9 mm mass     11/06/2020 Genetic Testing   Negative hereditary cancer genetic testing: no pathogenic variants detected in [facility] BRCAPlus Panel and [facility] CancerNext-Expanded +RNAinsight Panel. The report dates are November 06, 2020 and November 10, 2020, respectively.   The BRCAplus panel offered by [employer] and includes sequencing and deletion/duplication analysis for the following 8 genes: ATM, BRCA1, BRCA2, CDH1, CHEK2, PALB2, PTEN, and TP53.  The CancerNext-Expanded gene panel offered by [facility] and includes sequencing, rearrangement, and RNA analysis for the following 77 genes: AIP, ALK, APC, ATM, AXIN2, BAP1, BARD1, BLM, BMPR1A, BRCA1, BRCA2, BRIP1, CDC73, CDH1, CDK4, CDKN1B, CDKN2A, CHEK2, CTNNA1, DICER1, FANCC, FH, FLCN, GALNT12, KIF1B, LZTR1, MAX, MEN1, MET, MLH1, MSH2, MSH3, MSH6, MUTYH, NBN, NF1, NF2, NTHL1, PALB2, PHOX2B, PMS2, POT1, PRKAR1A, PTCH1, PTEN, RAD51C, RAD51D, RB1, RECQL, RET, SDHA, SDHAF2, SDHB, SDHC, SDHD, SMAD4, SMARCA4, SMARCB1, SMARCE1, STK11, SUFU, TMEM127,  TP53, TSC1, TSC2, VHL and XRCC2 (sequencing and deletion/duplication); EGFR, EGLN1, HOXB13, KIT, MITF, PDGFRA, POLD1, and POLE (sequencing only); EPCAM and GREM1 (deletion/duplication only).    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 female returns for evaluation of persistent fatigue and emotional concerns.  Magda Paganini met with myself and Lorrin Jackson her chaplain.  She notes that she has been increasingly stressed.  Prior to her diagnosis she had been married and was in the process of blending her family and there have been several custodial changes in addition to family stressors with her parents that have impacted her stress level.  In addition to this she has been dealing with a new breast cancer diagnosis, traveling as much is an hour a day for treatments and trying to figure out how to juggle caring for her kids France Ravens working, managing her kids extracurricular activities, being in a new marriage, and feeling increasingly exhausted from all of it.  Latona notes that she has been increasingly tearful at times alternating with irritability.  She notes that she feels that she is at the point where she could use a medication to help deal with all of this and also plans on reaching out for counseling.   Patient Active Problem List   Diagnosis Date Noted   Genetic testing 11/07/2020   Malignant neoplasm of upper-outer quadrant of left breast in female, estrogen receptor positive ([place]) 10/28/2020   Postoperative state 04/26/2019  Post-operative state 04/26/2019   Menorrhagia 03/21/2018    is allergic to nickel, other, tramadol, and oxycodone-acetaminophen.  MEDICAL HISTORY: Past Medical  History:  Diagnosis Date   Anemia    Breast cancer ([place])    Complication of anesthesia    nausea   Gestational diabetes    Kidney infection    Other abnormal glucose    Gestational diabetes with both children   Pneumonia    01-2019   PONV (postoperative nausea and vomiting)     SURGICAL HISTORY: Past Surgical History:  Procedure Laterality Date   BREAST LUMPECTOMY WITH RADIOACTIVE SEED AND SENTINEL LYMPH NODE BIOPSY Left 11/29/2020   Procedure: LEFT BREAST LUMPECTOMY WITH RADIOACTIVE SEED AND SENTINEL LYMPH NODE BIOPSY;  Surgeon: Jovita Kussmaul, MD;  Location: [facility];  Service: General;  Laterality: Left;   CESAREAN SECTION     X2   IUD inserted in [place]  2009   remove perforated IUD   ROBOTIC ASSISTED LAPAROSCOPIC LYSIS OF ADHESION N/A 04/26/2019   Procedure: XI ROBOTIC ASSISTED LAPAROSCOPIC LYSIS OF ADHESION;  Surgeon: [name], MD;  Location: [facility];  Service: Gynecology;  Laterality: N/A;   ROBOTIC ASSISTED TOTAL HYSTERECTOMY WITH BILATERAL SALPINGO OOPHERECTOMY Bilateral 04/26/2019   Procedure: XI ROBOTIC ASSISTED TOTAL LAPAROSCOPIC HYSTERECTOMY WITH BILATERAL SALPINGECTOMY;  Surgeon: [name], MD;  Location: [facility];  Service: Gynecology;  Laterality: Bilateral;  requests 8:30 OR start in [place] block time requests 2 hours   WISDOM TOOTH EXTRACTION      SOCIAL HISTORY: Social History   Socioeconomic History   Marital status: Married    Spouse name: Not on file   Number of children: Not on file   Years of education: Not on file   Highest education level: Not on file  Occupational History   Not on file  Tobacco Use   Smoking status: Never   Smokeless tobacco: Never  Vaping Use   Vaping Use: Never used  Substance and Sexual Activity   Alcohol use: Yes    Comment: occas   Drug use: No   Sexual activity: Yes    Birth control/protection: Surgical    Comment:  Hyst-INTERCOURSE AGE 46, LESS THA N 5 SEXUAL PARTNERS  Other Topics Concern   Not on file  Social History Narrative   Not on file   Social Determinants of Health   Financial Resource Strain: Not on file  Food Insecurity: Not on file  Transportation Needs: Not on file  Physical Activity: Not on file  Stress: Not on file  Social Connections: Not on file  Intimate Partner Violence: Not on file    FAMILY HISTORY: Family History  Problem Relation Age of Onset   Diabetes Mother    Thyroid disease Father    Diabetes Father    Thyroid disease Sister    Thyroid cancer Maternal Aunt        papillary; dx 48s   Brain cancer Maternal Uncle 2       gliolblastoma   Rectal cancer Paternal Uncle        mets; dx 17s   Cancer Paternal Uncle        unknown type; dx 86s   Thyroid cancer Maternal Grandmother        follicular   Prostate cancer Maternal Grandfather        dx 72s   Melanoma Paternal Grandmother        mets; dx  40s    Review of Systems  Constitutional:  Positive for fatigue. Negative for appetite change, chills, fever and unexpected weight change.  HENT:   Negative for hearing loss, lump/mass and trouble swallowing.   Eyes:  Negative for eye problems and icterus.  Respiratory:  Negative for chest tightness, cough and shortness of breath.   Cardiovascular:  Negative for chest pain, leg swelling and palpitations.  Gastrointestinal:  Negative for abdominal distention, abdominal pain, constipation, diarrhea, nausea and vomiting.  Endocrine: Negative for hot flashes.  Genitourinary:  Negative for difficulty urinating.   Musculoskeletal:  Negative for arthralgias.  Skin:  Negative for itching and rash.  Neurological:  Negative for dizziness, extremity weakness, headaches and numbness.  Hematological:  Negative for adenopathy. Does not bruise/bleed easily.  Psychiatric/Behavioral:  Positive for sleep disturbance. Negative for depression. The patient is nervous/anxious.       PHYSICAL EXAMINATION  ECOG PERFORMANCE STATUS: 1 - Symptomatic but completely ambulatory  Vitals:   02/17/21 1431  BP: 131/77  Pulse: 74  Resp: 17  Temp: 97.7 F (36.5 C)  SpO2: 100%    Physical Exam Constitutional:      General: She is not in acute distress.    Appearance: Normal appearance. She is not toxic-appearing.  HENT:     Head: Normocephalic and atraumatic.  Eyes:     General: No scleral icterus. Cardiovascular:     Rate and Rhythm: Normal rate and regular rhythm.     Pulses: Normal pulses.     Heart sounds: Normal heart sounds.  Pulmonary:     Effort: Pulmonary effort is normal.     Breath sounds: Normal breath sounds.  Abdominal:     General: Abdomen is flat. Bowel sounds are normal. There is no distension.     Palpations: Abdomen is soft.     Tenderness: There is no abdominal tenderness.  Musculoskeletal:        General: No swelling.     Cervical back: Neck supple.  Lymphadenopathy:     Cervical: No cervical adenopathy.  Skin:    General: Skin is warm and dry.     Findings: No rash.  Neurological:     General: No focal deficit present.     Mental Status: She is alert.  Psychiatric:        Mood and Affect: Mood normal.        Behavior: Behavior normal.    LABORATORY DATA:  CBC    Component Value Date/Time   WBC 5.8 02/18/2021 1422   WBC 8.2 10/08/2020 1229   RBC 4.09 02/18/2021 1422   HGB 12.3 02/18/2021 1422   HGB 12.3 03/19/2017 1456   HCT 36.2 02/18/2021 1422   HCT 37.5 03/19/2017 1456   PLT 262 02/18/2021 1422   PLT 283 03/19/2017 1456   MCV 88.5 02/18/2021 1422   MCV 87 03/19/2017 1456   MCH 30.1 02/18/2021 1422   MCHC 34.0 02/18/2021 1422   RDW 12.1 02/18/2021 1422   RDW 13.3 03/19/2017 1456   LYMPHSABS 1.3 02/18/2021 1422   MONOABS 0.6 02/18/2021 1422   EOSABS 0.2 02/18/2021 1422   BASOSABS 0.0 02/18/2021 1422    CMP     Component Value Date/Time   NA 139 02/18/2021 1422   K 3.6 02/18/2021 1422   CL 105 02/18/2021  1422   CO2 28 02/18/2021 1422   GLUCOSE 89 02/18/2021 1422   BUN 10 02/18/2021 1422   CREATININE 0.70 02/18/2021 1422   CREATININE 0.72 10/08/2020 1229  CALCIUM 9.4 02/18/2021 1422   PROT 7.2 02/18/2021 1422   ALBUMIN 4.4 02/18/2021 1422   AST 16 02/18/2021 1422   ALT 14 02/18/2021 1422   ALKPHOS 47 02/18/2021 1422   BILITOT 0.3 02/18/2021 1422   GFRNONAA >60 02/18/2021 1422        ASSESSMENT and THERAPY PLAN:   Malignant neoplasm of upper-outer quadrant of left breast in female, estrogen receptor positive () 11/29/2020: 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 I discussed the margins.  We also discussed the final staging along with previously performed ER/PR testing.  Treatment plan: 1. Oncotype DX: Low risk no role of chemo 2. Adjuvant radiation therapy followed by 3. Adjuvant antiestrogen therapy  --------------------------------------------------------------------------------------------------------------  Magda Paganini and I met today joint with Lattie Haw lending our chaplain.  We had a detailed conversation and through shared decision making below is the plan.  1.  Analiyah will start Effexor xr 37.5 mg daily. 2.  Jaydynn will reach back out to undergo personal counseling. 3.  We will get some labs to evaluate for other etiologies of fatigue and I will call [place] with the results.  Tirsa has follow-up with Dr. Lindi Adie on February 13 and she will keep that appointment.  She knows to call me if she has any of the side effects that we discussed in detail during her appointment.   Orders Placed This Encounter  Procedures   CMP ([facility] only)    Standing Status:   Standing    Number of Occurrences:   1     Standing Expiration Date:   02/17/2022   CBC with Differential ([facility] Only)    Standing Status:   Standing    Number of Occurrences:   1    Standing Expiration Date:   02/17/2022   TSH    Standing Status:   Standing    Number of Occurrences:   1    Standing Expiration Date:   02/17/2022   Vitamin D 25 hydroxy    Standing Status:   Standing    Number of Occurrences:   1    Standing Expiration Date:   02/17/2022   Vitamin B12    Standing Status:   Standing    Number of Occurrences:   1    Standing Expiration Date:   02/17/2022    All questions were answered. The patient knows to call the clinic with any problems, questions or concerns. We can certainly see the patient much sooner if necessary.  Total encounter time: 45 minutes in face to face visit time, counseling and patient education, shared decision making, order entry, chart review, care coordination, and documentaiton of the encounter.  Wilber Bihari, NP 02/19/21 7:27 AM Medical Oncology and Hematology [facility] [place], [place] [address] Tel. [phone]    Fax. [phone]   *Total Encounter Time as defined by the Centers for Medicare and Medicaid Services includes, in addition to the face-to-face time of a patient visit (documented in the note above) non-face-to-face time: obtaining and reviewing outside history, ordering and reviewing medications, tests or procedures, care coordination (communications with other health care professionals or caregivers) and documentation in the medical record.

## 2021-02-17 NOTE — Progress Notes (Signed)
[place] Note  Made a tandem visit with Charlestine Massed Causey/NP per referral from [facility]/RN for emotional support.  Keymani is feeling overwhelmed with the combination of treatment and associated commute, physical symptoms, tiredness, family needs and transitions (including homeschooling, family blending, etc), and just not having enough time/energy/space to process her diagnosis, treatment, and life changes. She reports symptoms including tearfulness, irritability, difficulty sleeping due to stress and physical discomfort, and overall unsustainably high stress level.  Provided empathic listening, pastoral reflection, normalization of feelings, and affirmation of strengths: self-awareness, insight, compassion, marital communication, desire for improved quality of life, courage to reach out, and faith and spiritual practice (including independent Bible study for women with breast cancer).  Even in the midst of time constraints, encouraged seeking a therapist of her own to create a container for processing. She plans to contact someone she has appreciated in the past and is aware of the psychologytoday.com Therapist Finder tool that enables use of specific search criteria to find counselors' professional profiles to review. Having a sense of agency in the search process (rather than a single referral) appeals to her.  We plan to follow up by phone next week and ca two weeks later for pastoral check-ins. A time of day that is constructive for [place] is around 1pm, when she can drive to radiation hands-free and talk without interruption.   [place], [place], [facility] Pager [phone] Voicemail [phone]

## 2021-02-18 ENCOUNTER — Ambulatory Visit
Admission: RE | Admit: 2021-02-18 | Discharge: 2021-02-18 | Disposition: A | Discharge status: Home / Self Care | Source: Ambulatory Visit | Attending: Radiation Oncology | Admitting: Radiation Oncology

## 2021-02-18 ENCOUNTER — Inpatient Hospital Stay

## 2021-02-18 DIAGNOSIS — Z510 Encounter for antineoplastic radiation therapy: Secondary | ICD-10-CM | POA: Diagnosis not present

## 2021-02-18 DIAGNOSIS — C50412 Malignant neoplasm of upper-outer quadrant of left female breast: Secondary | ICD-10-CM

## 2021-02-18 DIAGNOSIS — Z170 Estrogen receptor positive status [ER+]: Secondary | ICD-10-CM

## 2021-02-18 LAB — CMP (CANCER CENTER ONLY)
ALT: 14 U/L (ref 0–44)
AST: 16 U/L (ref 15–41)
Albumin: 4.4 g/dL (ref 3.5–5.0)
Alkaline Phosphatase: 47 U/L (ref 38–126)
Anion gap: 6 (ref 5–15)
BUN: 10 mg/dL (ref 6–20)
CO2: 28 mmol/L (ref 22–32)
Calcium: 9.4 mg/dL (ref 8.9–10.3)
Chloride: 105 mmol/L (ref 98–111)
Creatinine: 0.7 mg/dL (ref 0.44–1.00)
GFR, Estimated: >60 mL/min (ref >60)
Glucose, Bld: 89 mg/dL (ref 70–99)
Potassium: 3.6 mmol/L (ref 3.5–5.1)
Sodium: 139 mmol/L (ref 135–145)
Total Bilirubin: 0.3 mg/dL (ref 0.3–1.2)
Total Protein: 7.2 g/dL (ref 6.5–8.1)

## 2021-02-18 LAB — CBC WITH DIFFERENTIAL (CANCER CENTER ONLY)
Abs Immature Granulocytes: 0.01 10*3/uL (ref 0.00–0.07)
Basophils Absolute: 0 10*3/uL (ref 0.0–0.1)
Basophils Relative: 1 %
Eosinophils Absolute: 0.2 10*3/uL (ref 0.0–0.5)
Eosinophils Relative: 3 %
HCT: 36.2 % (ref 36.0–46.0)
Hemoglobin: 12.3 g/dL (ref 12.0–15.0)
Immature Granulocytes: 0 %
Lymphocytes Relative: 23 %
Lymphs Abs: 1.3 10*3/uL (ref 0.7–4.0)
MCH: 30.1 pg (ref 26.0–34.0)
MCHC: 34 g/dL (ref 30.0–36.0)
MCV: 88.5 fL (ref 80.0–100.0)
Monocytes Absolute: 0.6 10*3/uL (ref 0.1–1.0)
Monocytes Relative: 10 %
Neutro Abs: 3.7 10*3/uL (ref 1.7–7.7)
Neutrophils Relative %: 63 %
Platelet Count: 262 10*3/uL (ref 150–400)
RBC: 4.09 MIL/uL (ref 3.87–5.11)
RDW: 12.1 % (ref 11.5–15.5)
WBC Count: 5.8 10*3/uL (ref 4.0–10.5)
nRBC: 0 % (ref 0.0–0.2)

## 2021-02-18 LAB — VITAMIN B12: Vitamin B-12: 196 pg/mL (ref 180–914)

## 2021-02-18 LAB — VITAMIN D 25 HYDROXY (VIT D DEFICIENCY, FRACTURES): Vit D, 25-Hydroxy: 29.22 ng/mL — ABNORMAL LOW (ref 30–100)

## 2021-02-18 LAB — TSH: TSH: 2.372 u[IU]/mL (ref 0.308–3.960)

## 2021-02-19 ENCOUNTER — Ambulatory Visit
Admission: RE | Admit: 2021-02-19 | Discharge: 2021-02-19 | Disposition: A | Discharge status: Home / Self Care | Source: Ambulatory Visit | Attending: Radiation Oncology | Admitting: Radiation Oncology

## 2021-02-19 ENCOUNTER — Telehealth: Payer: Self-pay

## 2021-02-19 ENCOUNTER — Other Ambulatory Visit: Payer: Self-pay

## 2021-02-19 DIAGNOSIS — Z510 Encounter for antineoplastic radiation therapy: Secondary | ICD-10-CM | POA: Insufficient documentation

## 2021-02-19 DIAGNOSIS — Z170 Estrogen receptor positive status [ER+]: Secondary | ICD-10-CM | POA: Diagnosis not present

## 2021-02-19 DIAGNOSIS — C50412 Malignant neoplasm of upper-outer quadrant of left female breast: Secondary | ICD-10-CM | POA: Diagnosis present

## 2021-02-19 NOTE — Assessment & Plan Note (Signed)
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 I discussed the margins.  We also discussed the final staging along with previously performed ER/PR testing.  Treatment plan: 1. Oncotype DX: Low risk no role of chemo 2. Adjuvant radiation therapy followed by 3. Adjuvant antiestrogen therapy  --------------------------------------------------------------------------------------------------------------  Magda Paganini and I met today joint with Lattie Haw lending our chaplain.  We had a detailed conversation and through shared decision making below is the plan.  1.  Shantina will start Effexor xr 37.5 mg daily. 2.  Dondra will reach back out to undergo personal counseling. 3.  We will get some labs to evaluate for other etiologies of fatigue and I will call Seirra with the results.  Talia has follow-up with Dr. Lindi Adie on February 13 and she will keep that appointment.  She knows to call me if she has any of the side effects that we discussed in detail during her appointment.

## 2021-02-19 NOTE — Telephone Encounter (Signed)
Called pt to review recent labwork.  Pt verbalized understanding and thanks.  Additional questions regarding other vitamin supplements and Rx to be sent, I told patient I would follow up with Mendel Ryder regarding Vit K and B, as well as her Magic Mouthwash.  Pt verbalized thanks and understanding.

## 2021-02-20 ENCOUNTER — Other Ambulatory Visit: Payer: Self-pay

## 2021-02-20 ENCOUNTER — Encounter: Payer: Self-pay | Admitting: Adult Health

## 2021-02-20 ENCOUNTER — Ambulatory Visit: Admitting: Physical Therapy

## 2021-02-20 ENCOUNTER — Ambulatory Visit
Admission: RE | Admit: 2021-02-20 | Discharge: 2021-02-20 | Disposition: A | Discharge status: Home / Self Care | Source: Ambulatory Visit | Attending: Radiation Oncology | Admitting: Radiation Oncology

## 2021-02-20 ENCOUNTER — Other Ambulatory Visit (HOSPITAL_COMMUNITY): Payer: Self-pay

## 2021-02-20 DIAGNOSIS — Z510 Encounter for antineoplastic radiation therapy: Secondary | ICD-10-CM | POA: Diagnosis not present

## 2021-02-20 MED ORDER — LIDOCAINE VISCOUS HCL 2 % MT SOLN
OROMUCOSAL | 1 refills | Status: DC
  Filled 2021-02-20: qty 240, 16d supply, fill #0

## 2021-02-20 MED ORDER — MAGIC MOUTHWASH W/LIDOCAINE: 5.0000 mL | Freq: Three times a day (TID) | ORAL | 1 refills | Status: DC | PRN

## 2021-02-21 ENCOUNTER — Other Ambulatory Visit: Payer: Self-pay

## 2021-02-21 ENCOUNTER — Other Ambulatory Visit (HOSPITAL_COMMUNITY): Payer: Self-pay

## 2021-02-21 ENCOUNTER — Ambulatory Visit
Admission: RE | Admit: 2021-02-21 | Discharge: 2021-02-21 | Disposition: A | Discharge status: Home / Self Care | Source: Ambulatory Visit | Attending: Radiation Oncology | Admitting: Radiation Oncology

## 2021-02-21 ENCOUNTER — Ambulatory Visit: Admitting: Radiation Oncology

## 2021-02-21 DIAGNOSIS — Z510 Encounter for antineoplastic radiation therapy: Secondary | ICD-10-CM | POA: Diagnosis not present

## 2021-02-24 ENCOUNTER — Ambulatory Visit

## 2021-02-24 ENCOUNTER — Other Ambulatory Visit: Payer: Self-pay

## 2021-02-24 ENCOUNTER — Encounter: Payer: Self-pay | Admitting: General Practice

## 2021-02-24 DIAGNOSIS — Z510 Encounter for antineoplastic radiation therapy: Secondary | ICD-10-CM | POA: Diagnosis not present

## 2021-02-24 NOTE — Progress Notes (Signed)
[place] Spiritual Care Note  Followed up with Magda Paganini by phone as planned, providing opportunity for her to share and process medication and family-communication updates. She is working hard to be mindful and deliberate in both, utilizing recommended medication to help with physical/emotional symptoms and boundary-setting to help with relational symptoms (that is, communication complexities). By extension, Chevie is also working to model these values--reflection, clarity, communication, prayerfulness--to her children, which is very important to her. We plan to follow up again in ca two weeks for another pastoral check-in.   [place], [place], [facility] Pager [phone] Voicemail [phone]

## 2021-02-25 ENCOUNTER — Ambulatory Visit

## 2021-02-25 DIAGNOSIS — Z510 Encounter for antineoplastic radiation therapy: Secondary | ICD-10-CM | POA: Diagnosis not present

## 2021-02-26 ENCOUNTER — Other Ambulatory Visit: Payer: Self-pay

## 2021-02-26 ENCOUNTER — Ambulatory Visit
Admission: RE | Admit: 2021-02-26 | Discharge: 2021-02-26 | Disposition: A | Discharge status: Home / Self Care | Source: Ambulatory Visit | Attending: Radiation Oncology | Admitting: Radiation Oncology

## 2021-02-26 DIAGNOSIS — Z510 Encounter for antineoplastic radiation therapy: Secondary | ICD-10-CM | POA: Diagnosis not present

## 2021-02-27 ENCOUNTER — Encounter: Payer: Self-pay | Admitting: Adult Health

## 2021-02-27 ENCOUNTER — Ambulatory Visit
Admission: RE | Admit: 2021-02-27 | Discharge: 2021-02-27 | Disposition: A | Discharge status: Home / Self Care | Source: Ambulatory Visit | Attending: Radiation Oncology | Admitting: Radiation Oncology

## 2021-02-27 ENCOUNTER — Encounter: Payer: Self-pay | Admitting: *Deleted

## 2021-02-27 DIAGNOSIS — C50412 Malignant neoplasm of upper-outer quadrant of left female breast: Secondary | ICD-10-CM

## 2021-02-27 DIAGNOSIS — Z170 Estrogen receptor positive status [ER+]: Secondary | ICD-10-CM

## 2021-02-27 DIAGNOSIS — Z510 Encounter for antineoplastic radiation therapy: Secondary | ICD-10-CM | POA: Diagnosis not present

## 2021-02-28 ENCOUNTER — Other Ambulatory Visit (HOSPITAL_COMMUNITY): Payer: Self-pay

## 2021-02-28 ENCOUNTER — Other Ambulatory Visit: Payer: Self-pay

## 2021-02-28 ENCOUNTER — Ambulatory Visit

## 2021-02-28 ENCOUNTER — Ambulatory Visit
Admission: RE | Admit: 2021-02-28 | Discharge: 2021-02-28 | Disposition: A | Discharge status: Home / Self Care | Source: Ambulatory Visit | Attending: Radiation Oncology | Admitting: Radiation Oncology

## 2021-02-28 DIAGNOSIS — Z510 Encounter for antineoplastic radiation therapy: Secondary | ICD-10-CM | POA: Diagnosis not present

## 2021-02-28 NOTE — Progress Notes (Signed)
Patient Care Team: Patient, [name] as PCP - General (General Practice) Gabriela Germany, RN as Oncology Nurse Navigator Gabriela Kaufmann, RN as Oncology Nurse Navigator Gabriela Kussmaul, MD as Consulting Physician (General Surgery) Gabriela Lose, MD as Consulting Physician (Hematology and Oncology) Gabriela Rudd, MD as Consulting Physician (Radiation Oncology)  DIAGNOSIS:    ICD-10-CM   1. Malignant neoplasm of upper-outer quadrant of left breast in female, estrogen receptor positive ([place])  C50.412    Z17.0       SUMMARY OF ONCOLOGIC HISTORY: Oncology History  Malignant neoplasm of upper-outer quadrant of left breast in female, estrogen receptor positive ([place])  10/28/2020 Initial Diagnosis   Presented with new focal thickening and pain her outer left breast. Diagnostic mammogram and [place]: indeterminate mass in the left breast at 3:00. 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  Breast MRI: 1 prominent lymph node and 9 mm mass     11/06/2020 Genetic Testing   Negative hereditary cancer genetic testing: no pathogenic variants detected in [employer] BRCAPlus Panel and [employer] CancerNext-Expanded +RNAinsight Panel. The report dates are November 06, 2020 and November 10, 2020, respectively.   The BRCAplus panel offered by [employer] and includes sequencing and deletion/duplication analysis for the following 8 genes: ATM, BRCA1, BRCA2, CDH1, CHEK2, PALB2, PTEN, and TP53.  The CancerNext-Expanded gene panel offered by [facility] [place] and includes sequencing, rearrangement, and RNA analysis for the following 77 genes: AIP, ALK, APC, ATM, AXIN2, BAP1, BARD1, BLM, BMPR1A, BRCA1, BRCA2, BRIP1, CDC73, CDH1, CDK4, CDKN1B, CDKN2A, CHEK2, CTNNA1, DICER1, FANCC, FH, FLCN, GALNT12, KIF1B, LZTR1, MAX, MEN1, MET, MLH1, MSH2, MSH3, MSH6, MUTYH, NBN, NF1, NF2, NTHL1, PALB2, PHOX2B, PMS2, POT1, PRKAR1A, PTCH1, PTEN, RAD51C, RAD51D, RB1, RECQL,  RET, SDHA, SDHAF2, SDHB, SDHC, SDHD, SMAD4, SMARCA4, SMARCB1, SMARCE1, STK11, SUFU, TMEM127, TP53, TSC1, TSC2, VHL and XRCC2 (sequencing and deletion/duplication); EGFR, EGLN1, HOXB13, KIT, MITF, PDGFRA, POLD1, and POLE (sequencing only); EPCAM and GREM1 (deletion/duplication only).    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 with above-mentioned history of breast cancer treated with lumpectomy having undergone lumpectomy. She presents to the clinic today for follow-up.  She did very well from radiation standpoint.  She is here today to discuss antiestrogen treatment plan.  ALLERGIES:  is allergic to nickel, other, tramadol, and oxycodone-acetaminophen.  MEDICATIONS:  Current Outpatient Medications  Medication Sig Dispense Refill   acetaminophen (TYLENOL) 500 MG tablet Take 500 mg by mouth every 6 (six) hours as needed.     BLACK ELDERBERRY,BERRY-FLOWER, PO Take 1 capsule by mouth daily.     Charcoal Activated (ACTIVATED CHARCOAL) POWD by Does not apply route as needed.     ibuprofen (ADVIL) 400 MG tablet Take 400 mg by mouth every 6 (six) hours as needed.     magic mouthwash (lidocaine, diphenhydrAMINE, alum & mag hydroxide) suspension Swish and swallow 5 mls by mouth 3 times a day as needed 240 mL 1   magic mouthwash w/lidocaine SOLN Take 5 mLs by mouth 3 (three) times daily as needed for mouth pain. 240 mL 1   Medium Chain Triglycerides (MCT OIL PO) Take by  mouth daily.     Multiple Vitamins-Minerals (MULTIVITAMIN WITH MINERALS) tablet Take 2 tablets by mouth daily.     Probiotic Product (PROBIOTIC PO) Take 1 tablet by mouth daily.     Theanine 200 MG CAPS Take by mouth.      venlafaxine XR (EFFEXOR-XR) 37.5 MG 24 hr capsule Take 1 capsule (37.5 mg total) by mouth daily with breakfast. 30 capsule 0   vitamin C (ASCORBIC ACID) 250 MG tablet Take 250 mg by mouth daily.     vitamin E 1000 UNIT capsule Take 1,000 Units by mouth daily.     No current facility-administered medications for this visit.    PHYSICAL EXAMINATION: ECOG PERFORMANCE STATUS: 1 - Symptomatic but completely ambulatory  Vitals:   03/03/21 1442  BP: 134/89  Pulse: 88  Resp: 18  Temp: 97.9 F (36.6 C)  SpO2: 100%   Filed Weights   03/03/21 1442  Weight: 216 lb 8 oz (98.2 kg)      LABORATORY DATA:  I have reviewed the data as listed CMP Latest Ref Rng & Units 02/18/2021 10/30/2020 10/08/2020  Glucose 70 - 99 mg/dL 89 95 116(H)  BUN 6 - 20 mg/dL '10 12 14  ' Creatinine 0.44 - 1.00 mg/dL 0.70 0.67 0.72  Sodium 135 - 145 mmol/L 139 142 139  Potassium 3.5 - 5.1 mmol/L 3.6 4.0 3.8  Chloride 98 - 111 mmol/L 105 110 105  CO2 22 - 32 mmol/L '28 25 25  ' Calcium 8.9 - 10.3 mg/dL 9.4 9.8 9.5  Total Protein 6.5 - 8.1 g/dL 7.2 7.6 6.8  Total Bilirubin 0.3 - 1.2 mg/dL 0.3 0.4 0.3  Alkaline Phos 38 - 126 U/L 47 46 -  AST 15 - 41 U/L '16 17 16  ' ALT 0 - 44 U/L '14 19 14    ' Lab Results  Component Value Date   WBC 5.8 02/18/2021   HGB 12.3 02/18/2021   HCT 36.2 02/18/2021   MCV 88.5 02/18/2021   PLT 262 02/18/2021   NEUTROABS 3.7 02/18/2021    ASSESSMENT & PLAN:  Malignant neoplasm of upper-outer quadrant of left breast in female, estrogen receptor positive ([place]) 11/29/2020: Left lumpectomy: 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 I discussed the margins.  We also discussed the final staging along with previously performed ER/PR testing.    Treatment plan: 1. Oncotype DX: Low risk no role of chemo 2. Adjuvant radiation therapy completed 03/03/2021 3. Adjuvant antiestrogen therapy started 03/19/2021 -------------------------------------------------------------------------------------------------------------- Antiestrogen therapy: Previously we discussed the different antiestrogen treatment options including ovarian function suppression with an aromatase inhibitor versus tamoxifen. Patient decided to try tamoxifen.  I sent a prescription for tamoxifen 20 mg once daily.  For the first month she will take 10 mg daily. She had been doing normal stress over the past year.  She got married about 10 months ago and both sides of her family and her husband's family have found it very difficult and challenging over these months.  She is very relieved that her radiation is complete. Return to clinic in 3 months for survivorship care plan visit.  No orders of the defined types were placed in this encounter.  The patient has a good understanding of the overall plan. she agrees with  it. she will call with any problems that may develop before the next visit here.  Total time spent: 20 mins including face to face time and time spent for planning, charting and coordination of care  Rulon Eisenmenger, MD, MPH 03/03/2021  I, Thana Ates, am acting as scribe for Dr. Nicholas Evans.  I have reviewed the above documentation for accuracy and completeness, and I agree with the above.

## 2021-03-03 ENCOUNTER — Ambulatory Visit
Admission: RE | Admit: 2021-03-03 | Discharge: 2021-03-03 | Disposition: A | Discharge status: Home / Self Care | Source: Ambulatory Visit | Attending: Radiation Oncology | Admitting: Radiation Oncology

## 2021-03-03 ENCOUNTER — Encounter: Payer: Self-pay | Admitting: Radiation Oncology

## 2021-03-03 ENCOUNTER — Other Ambulatory Visit: Payer: Self-pay

## 2021-03-03 ENCOUNTER — Inpatient Hospital Stay: Attending: Hematology and Oncology | Admitting: Hematology and Oncology

## 2021-03-03 DIAGNOSIS — Z510 Encounter for antineoplastic radiation therapy: Secondary | ICD-10-CM | POA: Diagnosis not present

## 2021-03-03 DIAGNOSIS — C50412 Malignant neoplasm of upper-outer quadrant of left female breast: Secondary | ICD-10-CM | POA: Diagnosis not present

## 2021-03-03 DIAGNOSIS — Z170 Estrogen receptor positive status [ER+]: Secondary | ICD-10-CM | POA: Insufficient documentation

## 2021-03-03 MED ORDER — TAMOXIFEN CITRATE 20 MG PO TABS: 20.0000 mg | ORAL_TABLET | Freq: Every day | ORAL | 3 refills | Status: DC

## 2021-03-03 NOTE — Assessment & Plan Note (Signed)
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 I discussed the margins.  We also discussed the final staging along with previously performed ER/PR testing.  Treatment plan: 1. Oncotype DX: Low risk no role of chemo 2. Adjuvant radiation therapy completed 03/03/2021 3. Adjuvant antiestrogen therapystarted 03/19/2021 -------------------------------------------------------------------------------------------------------------- Antiestrogen therapy: Discussed the different antiestrogen treatment options including ovarian function suppression with an aromatase inhibitor versus tamoxifen.

## 2021-03-04 ENCOUNTER — Telehealth: Payer: Self-pay | Admitting: Hematology and Oncology

## 2021-03-04 NOTE — Telephone Encounter (Signed)
Scheduled appointment per 2/13 los. Patient is aware. 

## 2021-03-10 ENCOUNTER — Encounter: Payer: Self-pay | Admitting: General Practice

## 2021-03-10 ENCOUNTER — Encounter: Payer: Self-pay | Admitting: Hematology and Oncology

## 2021-03-10 NOTE — Progress Notes (Signed)
[place] Spiritual Care Note  Followed up with Magda Paganini briefly by phone. She reports that her new vitamins and supplements are helping, and that she feels and is coping well. Encouraged her to reach out as needed/desired for further conversation and reflection.   [place], [place], [facility] Pager [phone] Voicemail [phone]

## 2021-03-11 NOTE — Progress Notes (Signed)
° °                                                                                                                                                          °  Patient Name: Gabriela Evans MRN: [id number] DOB: 1980-11-09 Referring Physician: Jovita Kussmaul (Profile Not Attached) Date of Service: 03/03/2021 [place]                                                         End Of Treatment Note  Diagnoses: C50.412-Malignant neoplasm of upper-outer quadrant of left female breast C50.412-Malignant neoplasm of upper-outer quadrant of left female breast  Cancer Staging:  Stage IA, pT1cN0M0, grade 2, ER/PR positive invasive ductal carcinoma of the left breast  Intent: Curative  Radiation Treatment Dates: 01/14/2021 through 03/03/2021 Site Technique Total Dose (Gy) Dose per Fx (Gy) Completed Fx Beam Energies  Breast, Left: Breast_L 3D 50.4/50.4 1.8 28/28 6X  Breast, Left: Breast_L_Bst 3D 10/10 2 5/5 6X   Narrative: The patient tolerated radiation therapy relatively well. She developed fatigue and anticipated skin changes in the treatment field.   Plan: The patient will receive a call in about one month from the radiation oncology department. She will continue follow up with Dr. Lindi Adie as well.   ________________________________________________    Carola Rhine, [facility]

## 2021-03-12 ENCOUNTER — Other Ambulatory Visit: Payer: Self-pay

## 2021-03-12 ENCOUNTER — Inpatient Hospital Stay (HOSPITAL_BASED_OUTPATIENT_CLINIC_OR_DEPARTMENT_OTHER): Admitting: Adult Health

## 2021-03-12 ENCOUNTER — Encounter: Payer: Self-pay | Admitting: Adult Health

## 2021-03-12 VITALS — BP 129/83 | HR 74 | Temp 97.5°F | Resp 18 | Ht 69.0 in | Wt 216.2 lb

## 2021-03-12 DIAGNOSIS — Z170 Estrogen receptor positive status [ER+]: Secondary | ICD-10-CM

## 2021-03-12 DIAGNOSIS — C50412 Malignant neoplasm of upper-outer quadrant of left female breast: Secondary | ICD-10-CM

## 2021-03-12 NOTE — Progress Notes (Signed)
[facility] Follow up:    Patient, [name] No address on file   DIAGNOSIS:  Cancer Staging  Malignant neoplasm of upper-outer quadrant of left breast in female, estrogen receptor positive ([place]) Staging form: Breast, AJCC 8th Edition - Clinical stage from 10/30/2020: Stage IA (cT1b, cN0, cM0, G2, ER+, PR+, HER2-) - Unsigned Stage prefix: Initial diagnosis Histologic grading system: 3 grade system   SUMMARY OF ONCOLOGIC HISTORY: Oncology History  Malignant neoplasm of upper-outer quadrant of left breast in female, estrogen receptor positive ([place])  10/28/2020 Initial Diagnosis   Presented with new focal thickening and pain her outer left breast. Diagnostic mammogram and [place]: indeterminate mass in the left breast at 3:00. 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  Breast MRI: 1 prominent lymph node and 9 mm mass     11/06/2020 Genetic Testing   Negative hereditary cancer genetic testing: no pathogenic variants detected in [facility] BRCAPlus Panel and [facility] CancerNext-Expanded +RNAinsight Panel. The report dates are November 06, 2020 and November 10, 2020, respectively.   The BRCAplus panel offered by [employer] and includes sequencing and deletion/duplication analysis for the following 8 genes: ATM, BRCA1, BRCA2, CDH1, CHEK2, PALB2, PTEN, and TP53.  The CancerNext-Expanded gene panel offered by [facility] and includes sequencing, rearrangement, and RNA analysis for the following 77 genes: AIP, ALK, APC, ATM, AXIN2, BAP1, BARD1, BLM, BMPR1A, BRCA1, BRCA2, BRIP1, CDC73, CDH1, CDK4, CDKN1B, CDKN2A, CHEK2, CTNNA1, DICER1, FANCC, FH, FLCN, GALNT12, KIF1B, LZTR1, MAX, MEN1, MET, MLH1, MSH2, MSH3, MSH6, MUTYH, NBN, NF1, NF2, NTHL1, PALB2, PHOX2B, PMS2, POT1, PRKAR1A, PTCH1, PTEN, RAD51C, RAD51D, RB1, RECQL, RET, SDHA, SDHAF2, SDHB, SDHC, SDHD, SMAD4, SMARCA4, SMARCB1, SMARCE1, STK11, SUFU, TMEM127,  TP53, TSC1, TSC2, VHL and XRCC2 (sequencing and deletion/duplication); EGFR, EGLN1, HOXB13, KIT, MITF, PDGFRA, POLD1, and POLE (sequencing only); EPCAM and GREM1 (deletion/duplication only).    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 female returns for urgent evaluation of right breast thickness and pain.  She notes that when her left breast thickness and pain began she was told it was a fibroadenoma but they biopsied it anyway and ended up being breast cancer.  Her right breast is now similar.  She notes that she has not had menstrual cycles and is unsure if it could be related to hormone changes in her body, however this has been persistent and she remains concerned because its been going on for a few weeks now.  Of note when she did undergo breast imaging she had a breast density category D.  She just finished her radiation therapy and has her survivorship appointment in a few months.   Patient Active Problem List   Diagnosis Date Noted   Genetic testing 11/07/2020   Malignant neoplasm of upper-outer quadrant of left breast in female, estrogen receptor positive ([place]) 10/28/2020   Postoperative state 04/26/2019   Post-operative state 04/26/2019   Menorrhagia 03/21/2018    is allergic to nickel, other, tramadol, and oxycodone-acetaminophen.  MEDICAL HISTORY: Past Medical History:  Diagnosis Date   Anemia    Breast cancer ([place])    Complication of anesthesia    nausea   Gestational diabetes  Kidney infection    Other abnormal glucose    Gestational diabetes with both children   Pneumonia    01-2019   PONV (postoperative nausea and vomiting)     SURGICAL HISTORY: Past Surgical History:   Procedure Laterality Date   BREAST LUMPECTOMY WITH RADIOACTIVE SEED AND SENTINEL LYMPH NODE BIOPSY Left 11/29/2020   Procedure: LEFT BREAST LUMPECTOMY WITH RADIOACTIVE SEED AND SENTINEL LYMPH NODE BIOPSY;  Surgeon: Jovita Kussmaul, MD;  Location: [facility];  Service: General;  Laterality: Left;   CESAREAN SECTION     X2   IUD inserted in [place]  2009   remove perforated IUD   ROBOTIC ASSISTED LAPAROSCOPIC LYSIS OF ADHESION N/A 04/26/2019   Procedure: XI ROBOTIC ASSISTED LAPAROSCOPIC LYSIS OF ADHESION;  Surgeon: [name], MD;  Location: [facility];  Service: Gynecology;  Laterality: N/A;   ROBOTIC ASSISTED TOTAL HYSTERECTOMY WITH BILATERAL SALPINGO OOPHERECTOMY Bilateral 04/26/2019   Procedure: XI ROBOTIC ASSISTED TOTAL LAPAROSCOPIC HYSTERECTOMY WITH BILATERAL SALPINGECTOMY;  Surgeon: [name], MD;  Location: [facility];  Service: Gynecology;  Laterality: Bilateral;  requests 8:30 OR start in [place] block time requests 2 hours   WISDOM TOOTH EXTRACTION      SOCIAL HISTORY: Social History   Socioeconomic History   Marital status: Married    Spouse name: Not on file   Number of children: Not on file   Years of education: Not on file   Highest education level: Not on file  Occupational History   Not on file  Tobacco Use   Smoking status: Never   Smokeless tobacco: Never  Vaping Use   Vaping Use: Never used  Substance and Sexual Activity   Alcohol use: Yes    Comment: occas   Drug use: No   Sexual activity: Yes    Birth control/protection: Surgical    Comment: Hyst-INTERCOURSE AGE 59, LESS THA N 5 SEXUAL PARTNERS  Other Topics Concern   Not on file  Social History Narrative   Not on file   Social Determinants of Health   Financial Resource Strain: Not on file  Food Insecurity: Not on file  Transportation Needs: Not on file  Physical Activity: Not on file  Stress: Not on file   Social Connections: Not on file  Intimate Partner Violence: Not on file    FAMILY HISTORY: Family History  Problem Relation Age of Onset   Diabetes Mother    Thyroid disease Father    Diabetes Father    Thyroid disease Sister    Thyroid cancer Maternal Aunt        papillary; dx 16s   Brain cancer Maternal Uncle 2       gliolblastoma   Rectal cancer Paternal Uncle        mets; dx 40s   Cancer Paternal Uncle        unknown type; dx 33s   Thyroid cancer Maternal Grandmother        follicular   Prostate cancer Maternal Grandfather        dx 76s   Melanoma Paternal Grandmother        mets; dx 24s    Review of Systems  Constitutional:  Positive for fatigue. Negative for appetite change, chills, fever and unexpected weight change.  HENT:   Negative for hearing loss, lump/mass and trouble swallowing.   Eyes:  Negative for eye problems and icterus.  Respiratory:  Negative for chest tightness, cough  and shortness of breath.   Cardiovascular:  Negative for chest pain, leg swelling and palpitations.  Gastrointestinal:  Negative for abdominal distention, abdominal pain, constipation, diarrhea, nausea and vomiting.  Endocrine: Negative for hot flashes.  Genitourinary:  Negative for difficulty urinating.   Musculoskeletal:  Negative for arthralgias.  Skin:  Negative for itching and rash.  Neurological:  Negative for dizziness, extremity weakness, headaches and numbness.  Hematological:  Negative for adenopathy. Does not bruise/bleed easily.  Psychiatric/Behavioral:  Negative for depression. The patient is not nervous/anxious.      PHYSICAL EXAMINATION  ECOG PERFORMANCE STATUS: 1 - Symptomatic but completely ambulatory  Vitals:   03/12/21 0851  BP: 129/83  Pulse: 74  Resp: 18  Temp: (!) 97.5 F (36.4 C)  SpO2: 100%    Physical Exam Constitutional:      General: She is not in acute distress.    Appearance: Normal appearance. She is not toxic-appearing.  HENT:     Head:  Normocephalic and atraumatic.  Eyes:     General: No scleral icterus. Cardiovascular:     Rate and Rhythm: Normal rate and regular rhythm.     Pulses: Normal pulses.     Heart sounds: Normal heart sounds.  Pulmonary:     Effort: Pulmonary effort is normal.     Breath sounds: Normal breath sounds.  Chest:     Comments: Left breast status postlumpectomy and radiation no sign of local recurrence.  Right breast there is some thickness noted in the right upper outer quadrant. Abdominal:     General: Abdomen is flat. Bowel sounds are normal. There is no distension.     Palpations: Abdomen is soft.     Tenderness: There is no abdominal tenderness.  Musculoskeletal:        General: No swelling.     Cervical back: Neck supple.  Lymphadenopathy:     Cervical: No cervical adenopathy.  Skin:    General: Skin is warm and dry.     Findings: No rash.  Neurological:     General: No focal deficit present.     Mental Status: She is alert.  Psychiatric:        Mood and Affect: Mood normal.        Behavior: Behavior normal.    LABORATORY DATA:  CBC    Component Value Date/Time   WBC 5.8 02/18/2021 1422   WBC 8.2 10/08/2020 1229   RBC 4.09 02/18/2021 1422   HGB 12.3 02/18/2021 1422   HGB 12.3 03/19/2017 1456   HCT 36.2 02/18/2021 1422   HCT 37.5 03/19/2017 1456   PLT 262 02/18/2021 1422   PLT 283 03/19/2017 1456   MCV 88.5 02/18/2021 1422   MCV 87 03/19/2017 1456   MCH 30.1 02/18/2021 1422   MCHC 34.0 02/18/2021 1422   RDW 12.1 02/18/2021 1422   RDW 13.3 03/19/2017 1456   LYMPHSABS 1.3 02/18/2021 1422   MONOABS 0.6 02/18/2021 1422   EOSABS 0.2 02/18/2021 1422   BASOSABS 0.0 02/18/2021 1422    CMP     Component Value Date/Time   NA 139 02/18/2021 1422   K 3.6 02/18/2021 1422   CL 105 02/18/2021 1422   CO2 28 02/18/2021 1422   GLUCOSE 89 02/18/2021 1422   BUN 10 02/18/2021 1422   CREATININE 0.70 02/18/2021 1422   CREATININE 0.72 10/08/2020 1229   CALCIUM 9.4 02/18/2021  1422   PROT 7.2 02/18/2021 1422   ALBUMIN 4.4 02/18/2021 1422   AST 16 02/18/2021 1422  ALT 14 02/18/2021 1422   ALKPHOS 47 02/18/2021 1422   BILITOT 0.3 02/18/2021 1422   GFRNONAA >60 02/18/2021 1422         ASSESSMENT and THERAPY PLAN:   Malignant neoplasm of upper-outer quadrant of left breast in female, estrogen receptor positive ([place]) 11/29/2020: Left lumpectomy: 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 I discussed the margins.  We also discussed the final staging along with previously performed ER/PR testing.   Treatment plan: 1. Oncotype DX: Low risk no role of chemo 2. Adjuvant radiation therapy completed 03/03/2021 3. Adjuvant antiestrogen therapy started 03/19/2021 --------------------------------------------------------------------------------------------------------------  Gabriela Evans is here today for evaluation of right breast changes.  She does have some thickness in her right upper outer breast.  I have ordered a mammogram and ultrasound on this.  However if this is inconclusive considering her breast density category D would like to follow that up with breast MRI.  She will call me when she has this completed so we can review the results and order the breast MRI if indicated.  She is already set up for survivorship in May 2023.  We will follow-up with her sooner if needed.     All questions were answered. The patient knows to call the clinic with any problems, questions or concerns. We can certainly see the patient much sooner if necessary.  Total encounter time: 20 minutes in face-to-face visit time, chart review, lab review, care coordination, and documentation of the encounter.  Wilber Bihari, NP 03/12/21 10:49  AM Medical Oncology and Hematology [facility] [place], [place] [address] Tel. [phone]    Fax. [phone]  *Total Encounter Time as defined by the Centers for Medicare and Medicaid Services includes, in addition to the face-to-face time of a patient visit (documented in the note above) non-face-to-face time: obtaining and reviewing outside history, ordering and reviewing medications, tests or procedures, care coordination (communications with other health care professionals or caregivers) and documentation in the medical record.

## 2021-03-12 NOTE — Assessment & Plan Note (Signed)
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 I discussed the margins.  We also discussed the final staging along with previously performed ER/PR testing.  Treatment plan: 1. Oncotype DX: Low risk no role of chemo 2. Adjuvant radiation therapy completed 03/03/2021 3. Adjuvant antiestrogen therapystarted 03/19/2021 --------------------------------------------------------------------------------------------------------------  Gabriela Evans is here today for evaluation of right breast changes.  She does have some thickness in her right upper outer breast.  I have ordered a mammogram and ultrasound on this.  However if this is inconclusive considering her breast density category D would like to follow that up with breast MRI.  She will call me when she has this completed so we can review the results and order the breast MRI if indicated.  She is already set up for survivorship in May 2023.  We will follow-up with her sooner if needed.

## 2021-03-12 NOTE — Progress Notes (Signed)
Called [facility] and [facility] - both stated that pt insurance is out of network for them. I advised pt to please let us know who is covered with her insurance and I will call to arrange scheduling.

## 2021-03-18 ENCOUNTER — Encounter (HOSPITAL_COMMUNITY): Payer: Self-pay

## 2021-03-19 ENCOUNTER — Other Ambulatory Visit: Payer: Self-pay | Admitting: Adult Health

## 2021-03-19 ENCOUNTER — Ambulatory Visit
Admission: RE | Admit: 2021-03-19 | Discharge: 2021-03-19 | Disposition: A | Discharge status: Home / Self Care | Source: Ambulatory Visit | Attending: Adult Health | Admitting: Adult Health

## 2021-03-19 DIAGNOSIS — C50412 Malignant neoplasm of upper-outer quadrant of left female breast: Secondary | ICD-10-CM

## 2021-03-19 DIAGNOSIS — Z170 Estrogen receptor positive status [ER+]: Secondary | ICD-10-CM

## 2021-03-19 HISTORY — DX: Personal history of irradiation: Z92.3

## 2021-03-19 IMAGING — US US BREAST*R* LIMITED INC AXILLA
1 series · 13 of 21 positions shown · non-contrast
Comparison: Previous exam(s).

CLINICAL DATA: Status post LEFT lumpectomy in [DATE].
Patient completed radiation treatment in [REDACTED]. Patient has
recently noted pain and possible mass in the UPPER-OUTER QUADRANT of
the RIGHT breast. She has also noted swelling in the MEDIAL portion
of the RIGHT breast which she attributes to recent LEFT breast
radiation treatment and edema.

EXAM:
DIGITAL DIAGNOSTIC UNILATERAL RIGHT MAMMOGRAM WITH TOMOSYNTHESIS AND
CAD; ULTRASOUND RIGHT BREAST LIMITED
TECHNIQUE: Right digital diagnostic mammography and breast tomosynthesis was
performed. The images were evaluated with computer-aided detection.;
Targeted ultrasound examination of the right breast was performed

[Series 1: us breast*right* limited inc axilla · 0.06mm/px · 21 acquisitions, 13 frames shown]
[im 1/21]
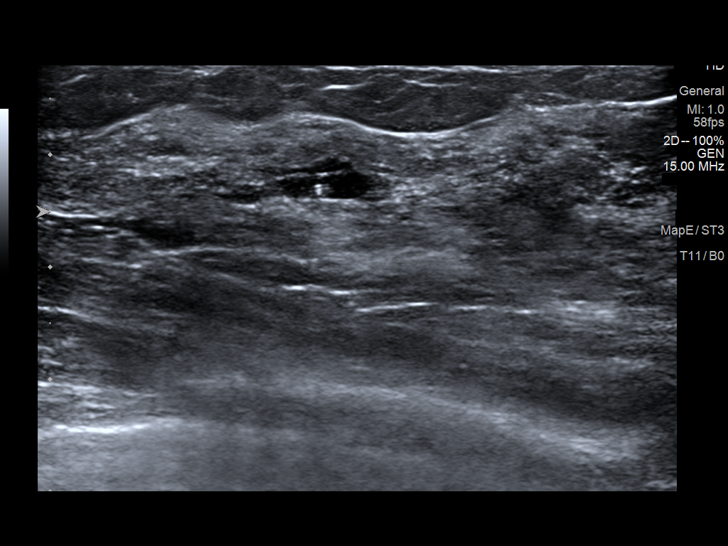
[im 3/21]
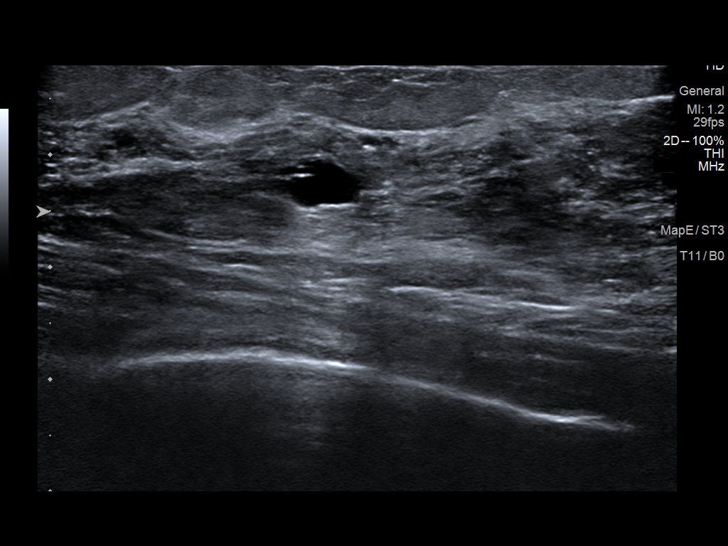
[im 5/21]
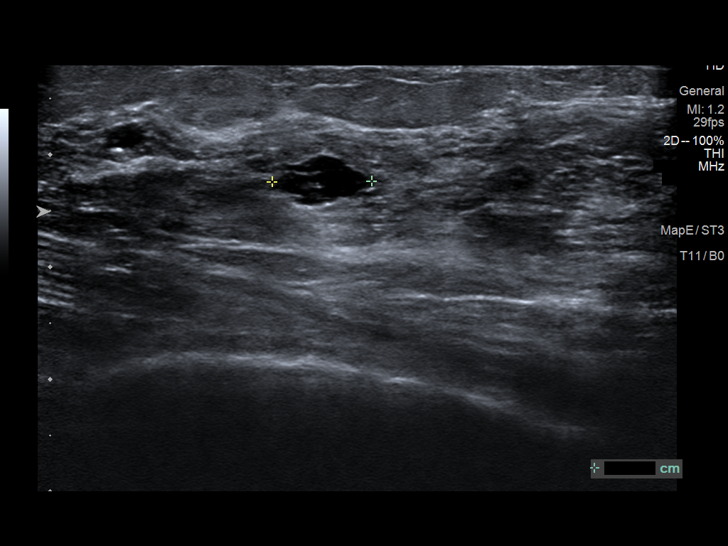
[im 6/21]
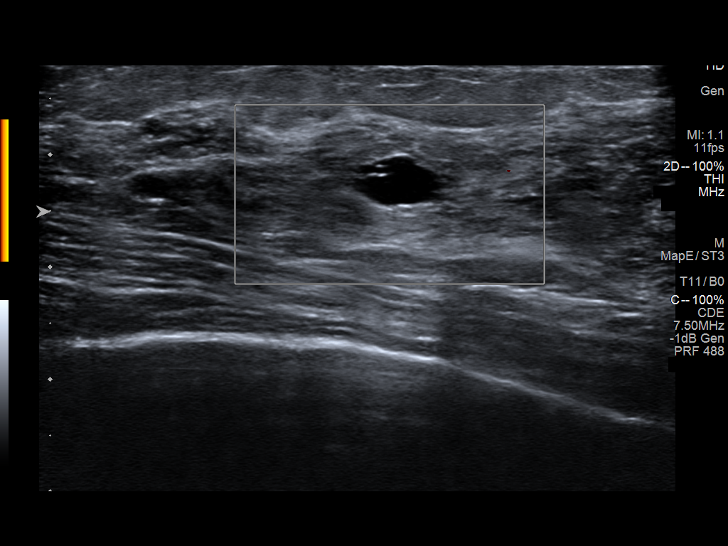
[im 8/21]
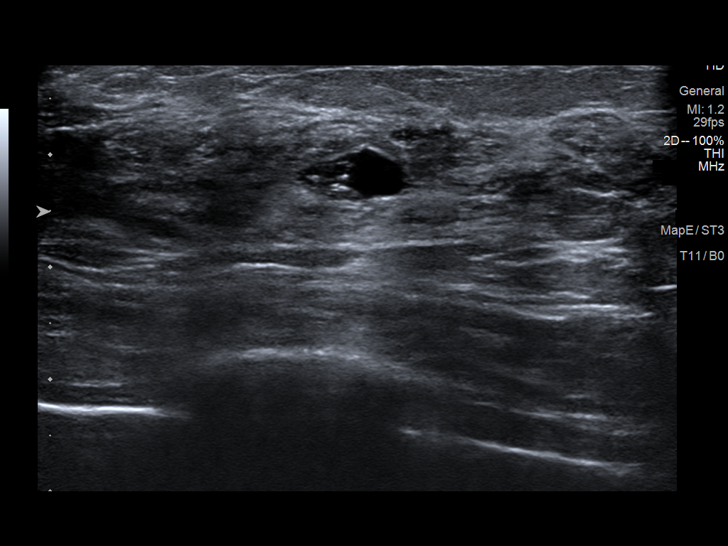
[im 9/21]
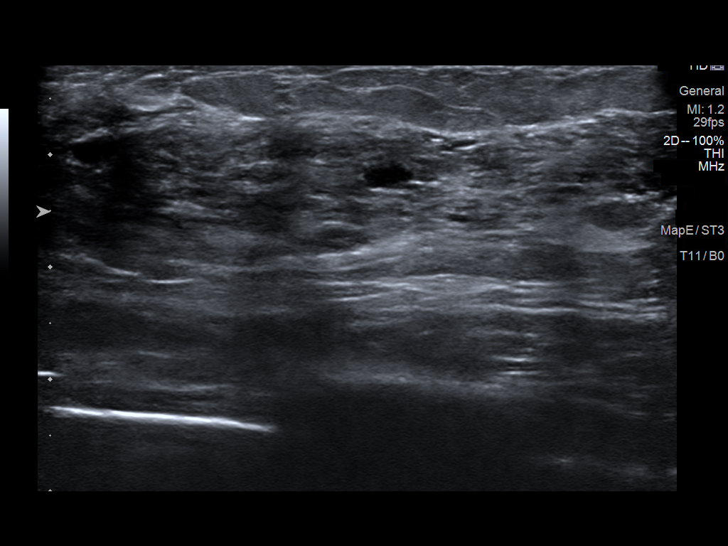
[im 11/21]
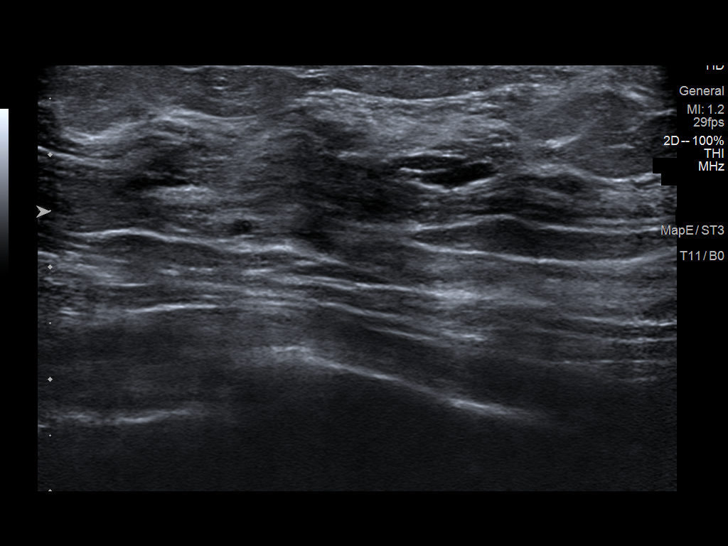
[im 13/21]
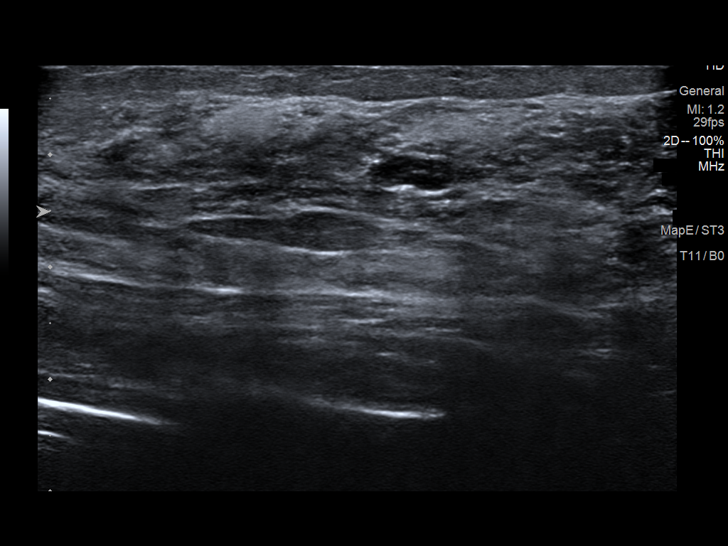
[im 14/21]
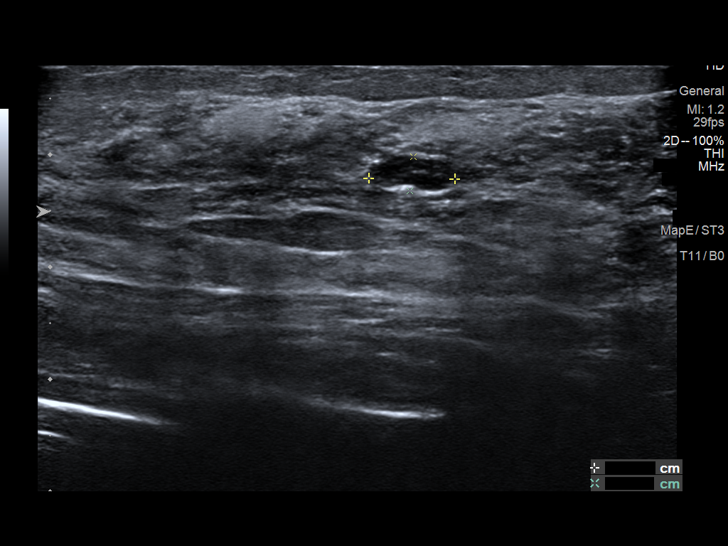
[im 16/21]
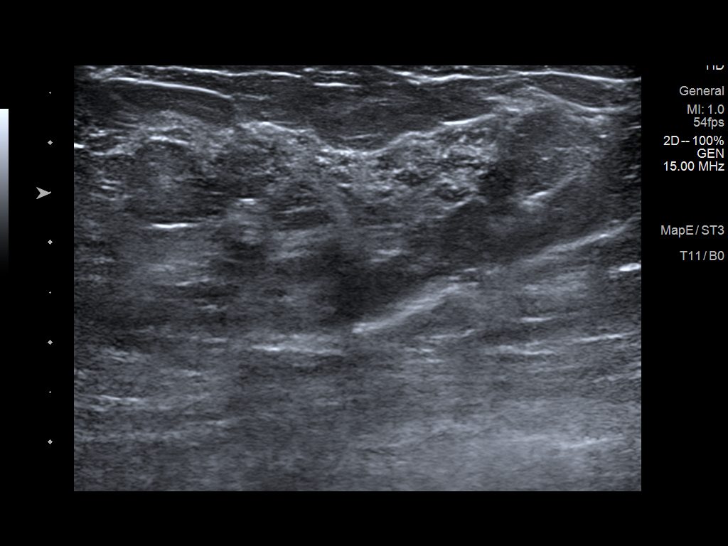
[im 17/21]
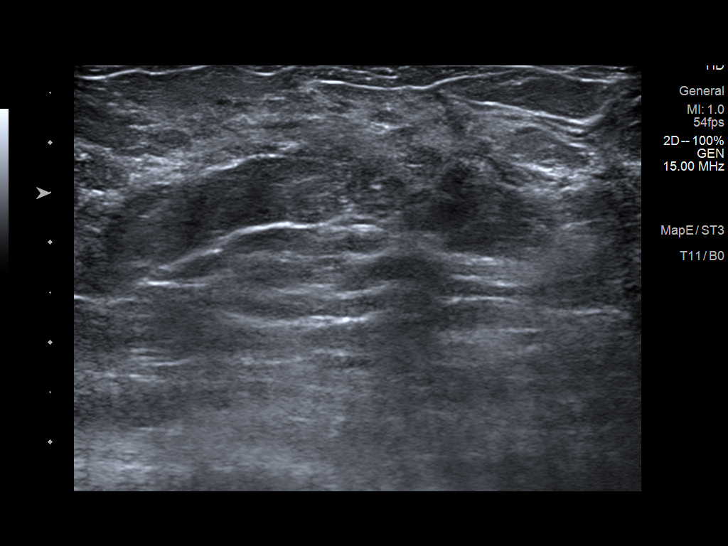
[im 19/21]
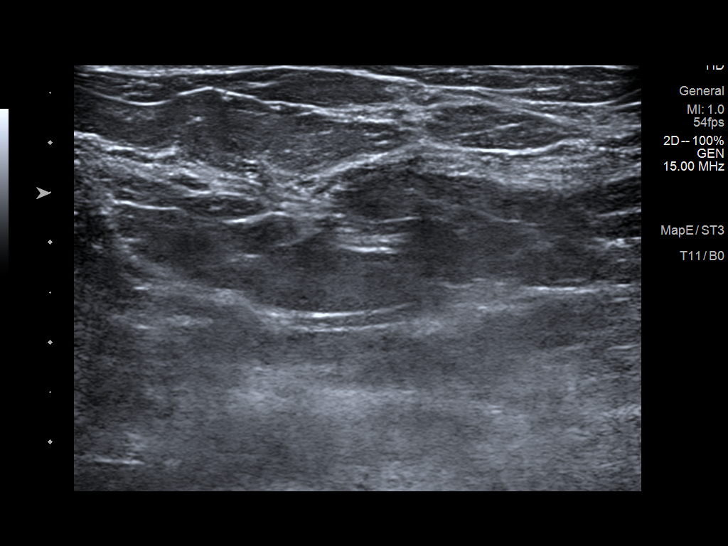
[im 21/21]
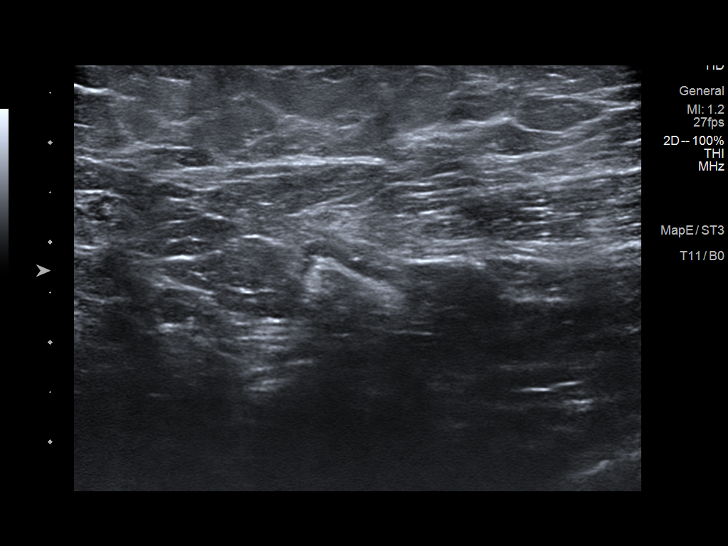

[13 of 21 positions shown; findings below may reference images not displayed]

ACR Breast Density Category d: The breast tissue is extremely dense,
which lowers the sensitivity of mammography.
FINDINGS: No suspicious mass, distortion, or microcalcifications are
identified to suggest presence of malignancy. Spot tangential view
of the area of concern shows normal appearing dense fibroglandular
tissue.

On physical exam, I palpate soft nonfocal thickening in the 10
o'clock location of the RIGHT breast in the area of concern. There
is no visible edema, skin thickening, or erythema in the MEDIAL
portion the LEFT breast.

Targeted ultrasound is performed, showing an anechoic mass with
internal septations in the 10 o'clock location of the RIGHT breast
10 centimeters from the nipple measuring 0.9 x 1.1 x
centimeters. No internal blood flow on Doppler evaluation.

In the 10 o'clock location 9 centimeters nipple, a hypoechoic and
anechoic mass contains internal septations measuring 1.0 x 0.8 x
centimeters.

Evaluation the RIGHT axilla is negative for adenopathy.

The RIGHT axilla is negative by ultrasound.
IMPRESSION: 1. Probably benign masses in the 10 o'clock location of the RIGHT
breast at 10 centimeters and 9 centimeters from the nipple. Findings
are favored to represent benign fibrocystic changes.
2. We discussed management options including excision, biopsy, and
close follow-up. Given the patient's recent history of cancer, she
requests biopsy of each lesion.
3. No RIGHT axillary adenopathy.

RECOMMENDATION:
Ultrasound-guided core biopsy of mass in the 10 o'clock location
RIGHT breast 10 centimeters from the nipple and the mass in the 10
o'clock location 9 centimeters from nipple.

I have discussed the findings and recommendations with the patient.
If applicable, a reminder letter will be sent to the patient
regarding the next appointment.

BI-RADS CATEGORY  3: Probably benign.

## 2021-03-19 IMAGING — MG MM DIGITAL DIAGNOSTIC UNILAT*R* W/ TOMO W/ CAD
6 series · 6 of 18 positions shown · non-contrast
Comparison: Previous exam(s).

CLINICAL DATA: Status post LEFT lumpectomy in [DATE].
Patient completed radiation treatment in [REDACTED]. Patient has
recently noted pain and possible mass in the UPPER-OUTER QUADRANT of
the RIGHT breast. She has also noted swelling in the MEDIAL portion
of the RIGHT breast which she attributes to recent LEFT breast
radiation treatment and edema.

EXAM:
DIGITAL DIAGNOSTIC UNILATERAL RIGHT MAMMOGRAM WITH TOMOSYNTHESIS AND
CAD; ULTRASOUND RIGHT BREAST LIMITED
TECHNIQUE: Right digital diagnostic mammography and breast tomosynthesis was
performed. The images were evaluated with computer-aided detection.;
Targeted ultrasound examination of the right breast was performed

[R CC synth-2D (1 of 2)]
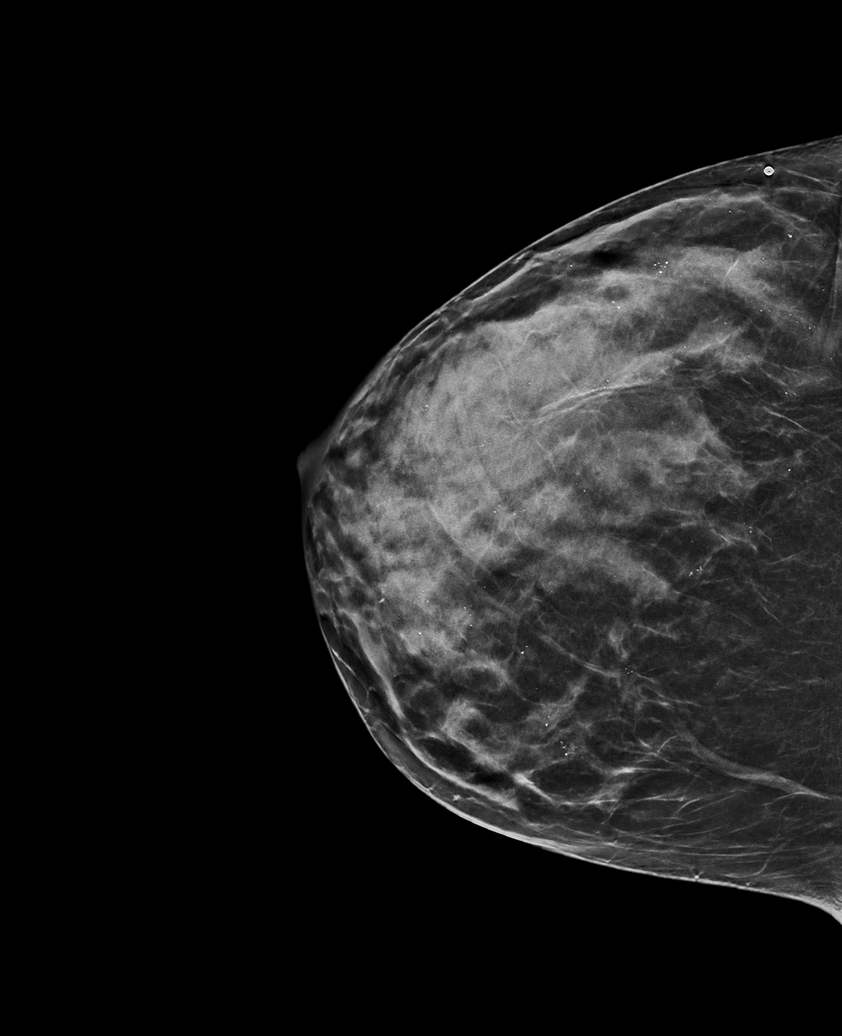

[R MLO synth-2D]
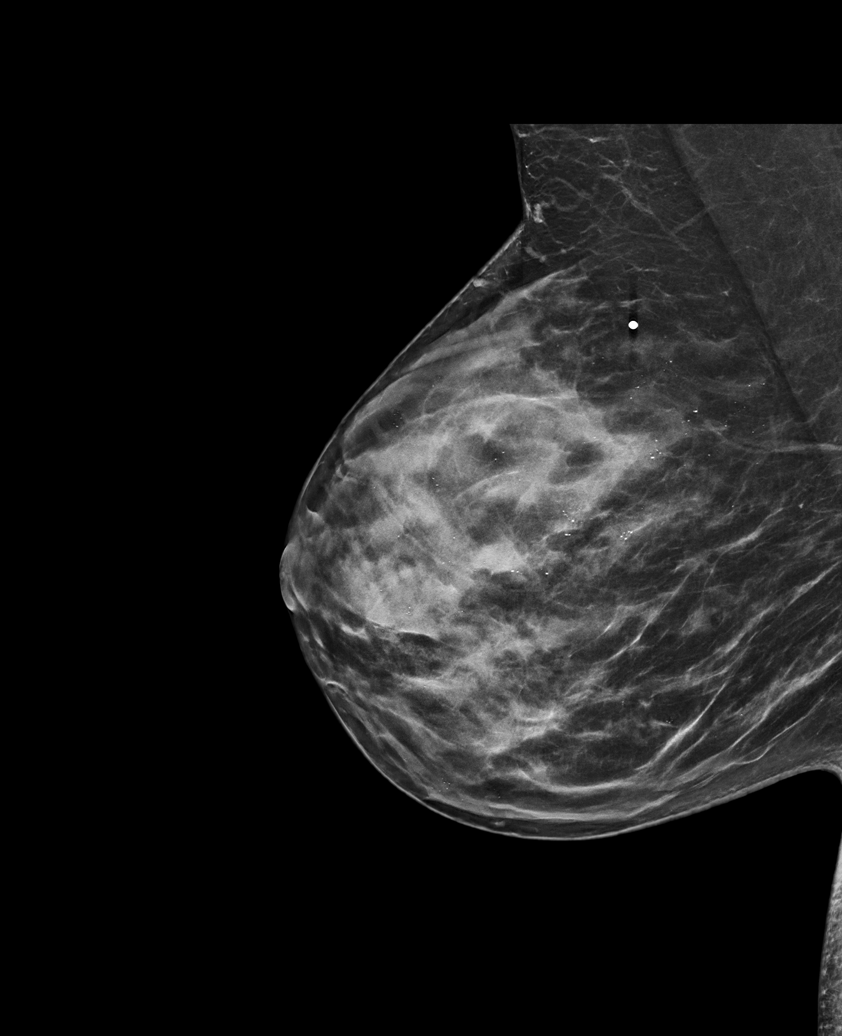

[R CC synth-2D (2 of 2)]
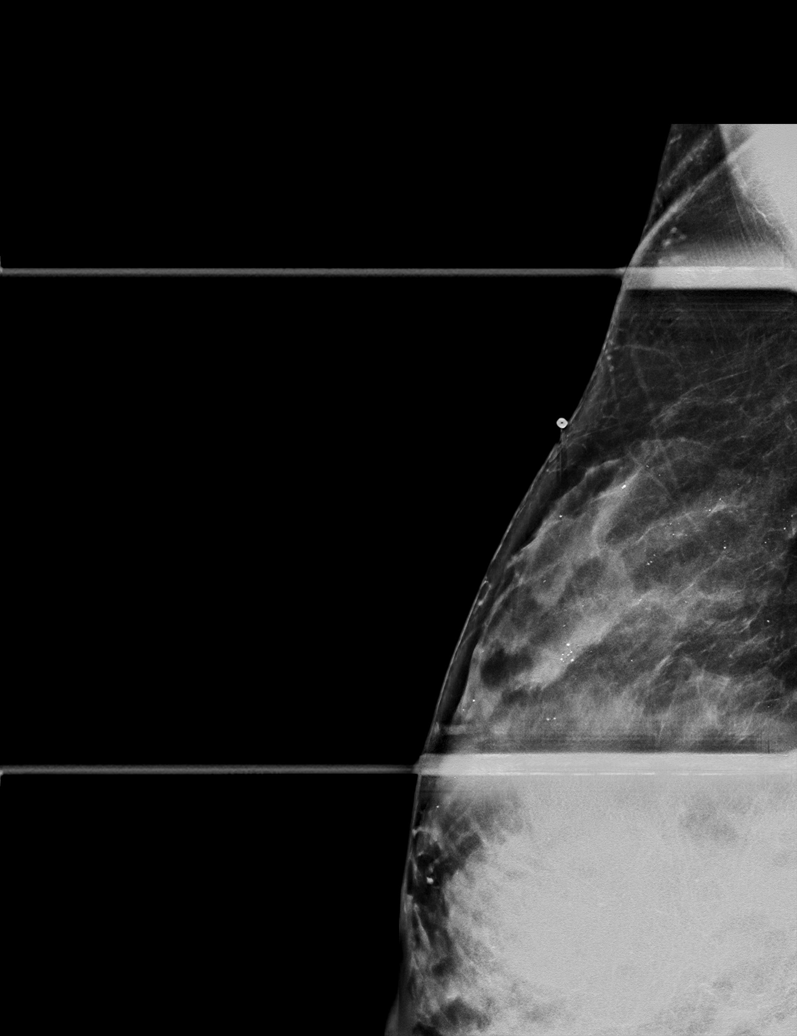

[R CC tomo (1 of 2) · tomo slice 32/63.0]
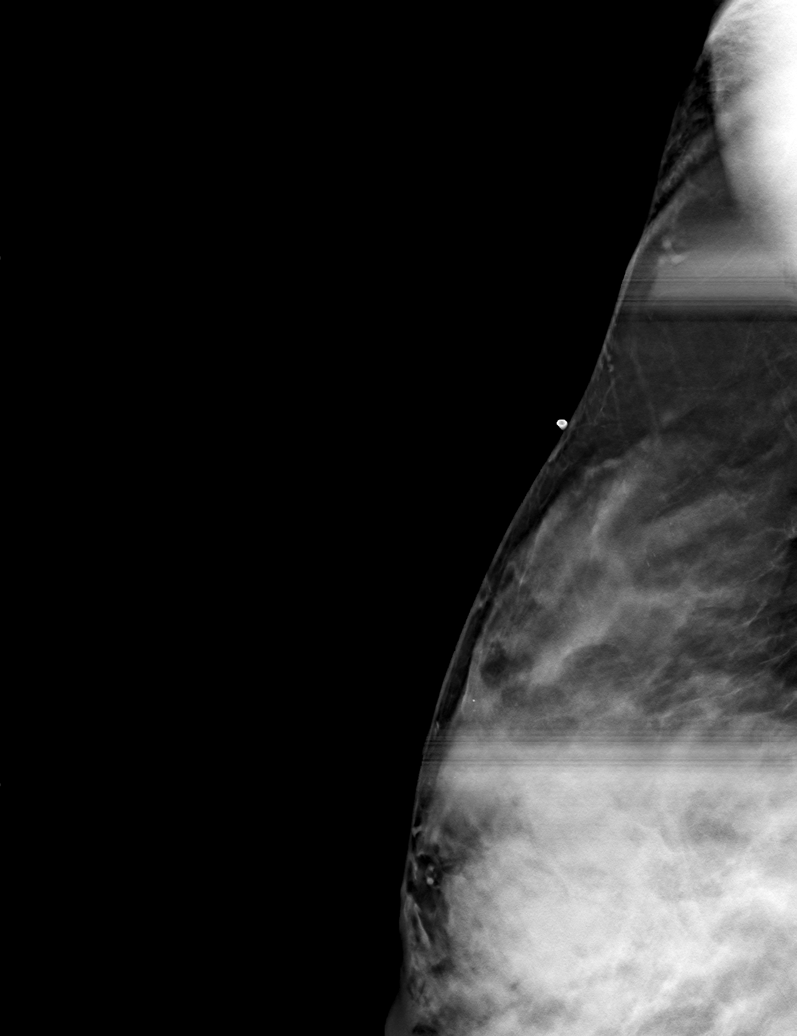

[R CC tomo (2 of 2) · tomo slice 31/61.0]
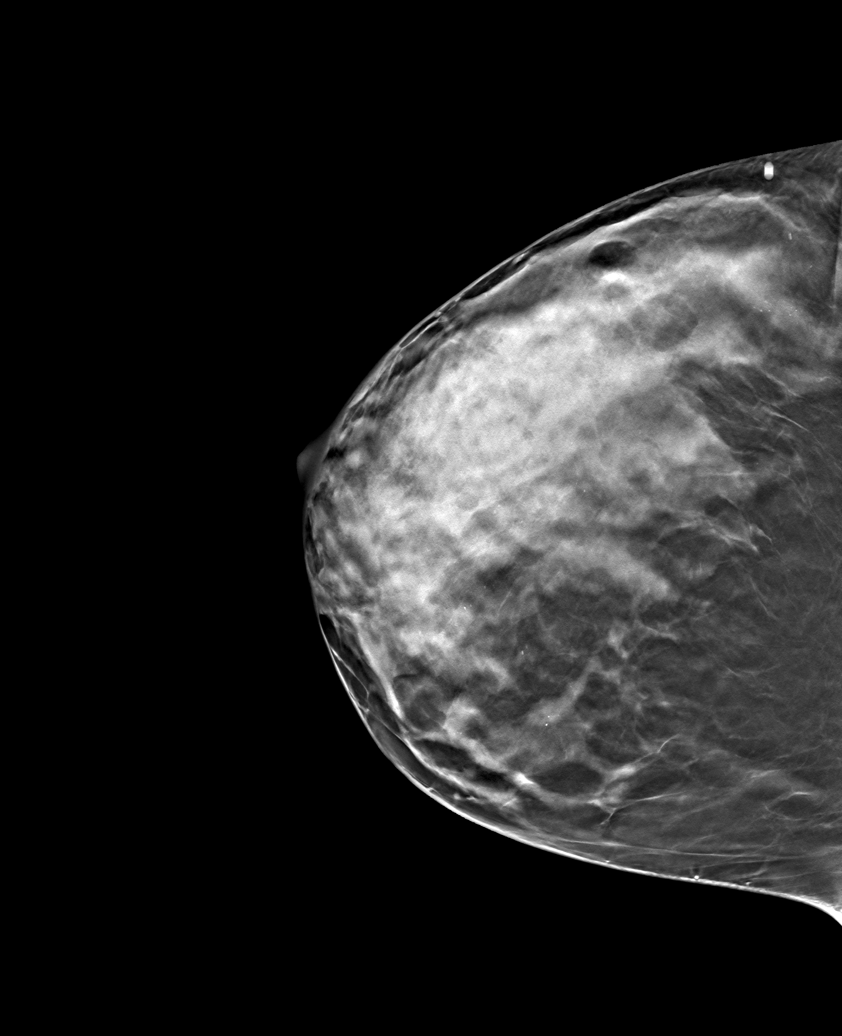

[R MLO tomo · tomo slice 37/73.0]
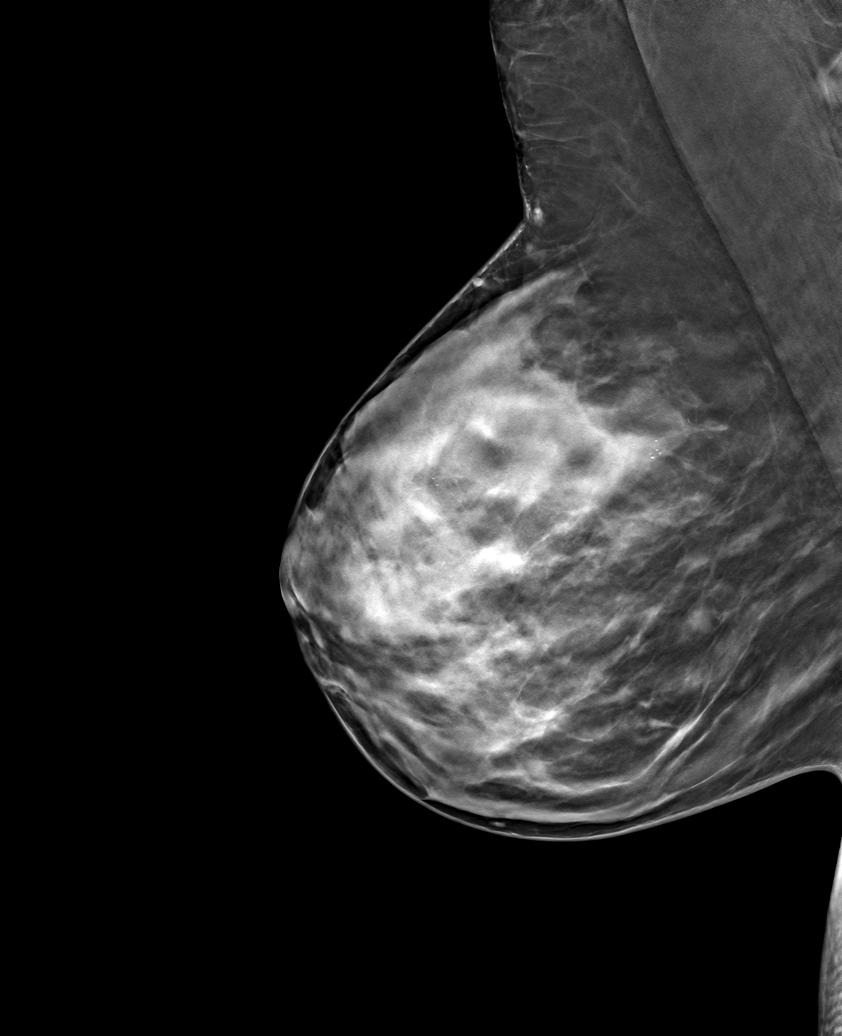

[6 of 18 positions shown; findings below may reference images not displayed]

ACR Breast Density Category d: The breast tissue is extremely dense,
which lowers the sensitivity of mammography.
FINDINGS: No suspicious mass, distortion, or microcalcifications are
identified to suggest presence of malignancy. Spot tangential view
of the area of concern shows normal appearing dense fibroglandular
tissue.

On physical exam, I palpate soft nonfocal thickening in the 10
o'clock location of the RIGHT breast in the area of concern. There
is no visible edema, skin thickening, or erythema in the MEDIAL
portion the LEFT breast.

Targeted ultrasound is performed, showing an anechoic mass with
internal septations in the 10 o'clock location of the RIGHT breast
10 centimeters from the nipple measuring 0.9 x 1.1 x
centimeters. No internal blood flow on Doppler evaluation.

In the 10 o'clock location 9 centimeters nipple, a hypoechoic and
anechoic mass contains internal septations measuring 1.0 x 0.8 x
centimeters.

Evaluation the RIGHT axilla is negative for adenopathy.

The RIGHT axilla is negative by ultrasound.
IMPRESSION: 1. Probably benign masses in the 10 o'clock location of the RIGHT
breast at 10 centimeters and 9 centimeters from the nipple. Findings
are favored to represent benign fibrocystic changes.
2. We discussed management options including excision, biopsy, and
close follow-up. Given the patient's recent history of cancer, she
requests biopsy of each lesion.
3. No RIGHT axillary adenopathy.

RECOMMENDATION:
Ultrasound-guided core biopsy of mass in the 10 o'clock location
RIGHT breast 10 centimeters from the nipple and the mass in the 10
o'clock location 9 centimeters from nipple.

I have discussed the findings and recommendations with the patient.
If applicable, a reminder letter will be sent to the patient
regarding the next appointment.

BI-RADS CATEGORY  3: Probably benign.

## 2021-03-24 ENCOUNTER — Other Ambulatory Visit: Payer: Self-pay

## 2021-03-24 ENCOUNTER — Ambulatory Visit
Admission: RE | Admit: 2021-03-24 | Discharge: 2021-03-24 | Disposition: A | Discharge status: Home / Self Care | Source: Ambulatory Visit | Attending: Hematology and Oncology | Admitting: Hematology and Oncology

## 2021-03-24 DIAGNOSIS — C50412 Malignant neoplasm of upper-outer quadrant of left female breast: Secondary | ICD-10-CM | POA: Insufficient documentation

## 2021-03-24 DIAGNOSIS — Z170 Estrogen receptor positive status [ER+]: Secondary | ICD-10-CM | POA: Insufficient documentation

## 2021-03-24 NOTE — Progress Notes (Signed)
?  Radiation Oncology         [phone] ?________________________________ ? ?Name: Gabriela Evans MRN: [id number]  ?Date of Service: 03/24/2021  DOB: [birth year] ? ?Post Treatment Telephone Note ? ?Diagnosis:   Stage IA, pT1cN0M0, grade 2, ER/PR positive invasive ductal carcinoma of the left breast ? ?Intent: Curative ? ?Radiation Treatment Dates: 01/14/2021 through 03/03/2021 ?Site Technique Total Dose (Gy) Dose per Fx (Gy) Completed Fx Beam Energies  ?Breast, Left: Breast_L 3D 50.4/50.4 1.8 28/28 6X  ?Breast, Left: Breast_L_Bst 3D 10/10 2 5/5 6X  ? ?Narrative: The patient tolerated radiation therapy relatively well. She developed fatigue and anticipated skin changes in the treatment field. She has continued to have sensitivity of her skin, left breast in general and fullness and induration deep to her incision site. She is seeing Dr. Marlou Starks on 04/01/21. She's also had neck issues and numbness of her left fingers and C Spine MRI shows disc disease and she's seeing Dr. Saintclair Halsted for this. She reports she also felt a right breast fullness and is going for ultrasound biopsy on 04/01/21 to make sure this is not new disease and rather fibrocystic changes. She also had a pectoralis injury and fractured a tooth recently.  ? ? ?Impression/Plan: ?1. Stage IA, pT1cN0M0, grade 2, ER/PR positive invasive ductal carcinoma of the left breast. The patient has been doing well since completion of radiotherapy. We discussed that we would be happy to continue to follow her as needed, but she will also continue to follow up with Dr. Lindi Adie in medical oncology. She was counseled on skin care as well as measures to avoid sun exposure to this area.  ?2. Peripheral neuropathy, pain and chest wall irritation. She will follow up with Dr. Saintclair Halsted and with Dr. Marlou Starks in case this is related versus separate issues.  ?3. Right breast lesions. She will proceed with ultrasound biopsy on 04/01/21. We will follow this expectantly.  ? ? ? ? ? ?Carola Rhine,  PAC  ? ? ? ? ?

## 2021-03-28 ENCOUNTER — Other Ambulatory Visit

## 2021-04-01 ENCOUNTER — Ambulatory Visit
Admission: RE | Admit: 2021-04-01 | Discharge: 2021-04-01 | Disposition: A | Discharge status: Home / Self Care | Source: Ambulatory Visit | Attending: Adult Health | Admitting: Adult Health

## 2021-04-01 ENCOUNTER — Other Ambulatory Visit: Payer: Self-pay

## 2021-04-01 DIAGNOSIS — C50412 Malignant neoplasm of upper-outer quadrant of left female breast: Secondary | ICD-10-CM

## 2021-04-01 IMAGING — MG MM BREAST LOCALIZATION CLIP
4 series · 4 of 12 positions shown · non-contrast
Comparison: Previous exam(s).

CLINICAL DATA: 32-year-old status post 2 area ultrasound-guided
biopsy of the right breast.

EXAM:
3D DIAGNOSTIC RIGHT MAMMOGRAM POST ULTRASOUND BIOPSY

[R CC synth-2D]
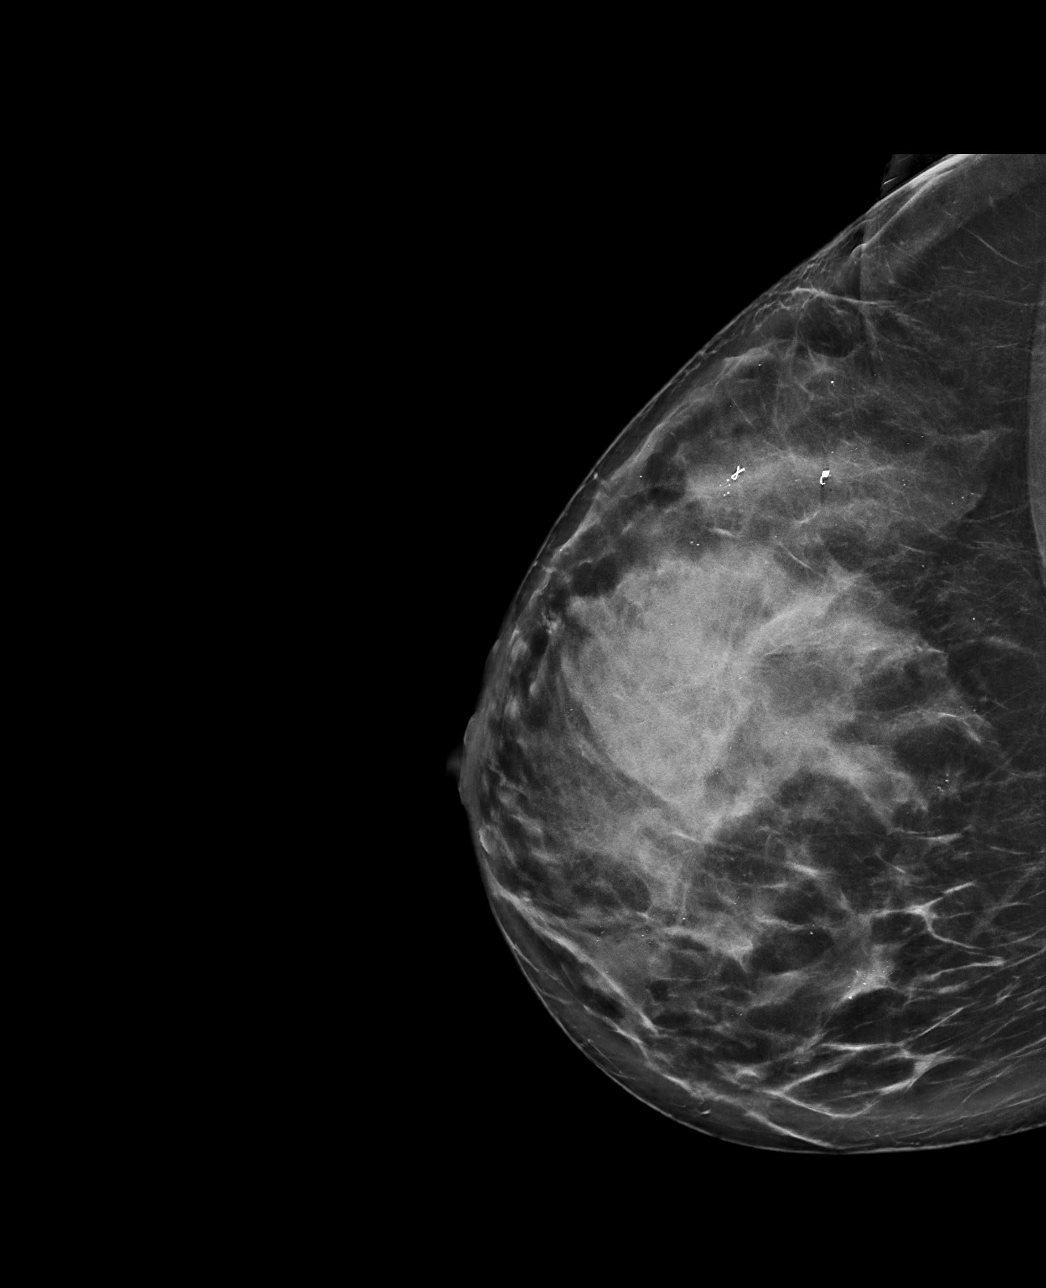

[R ML synth-2D]
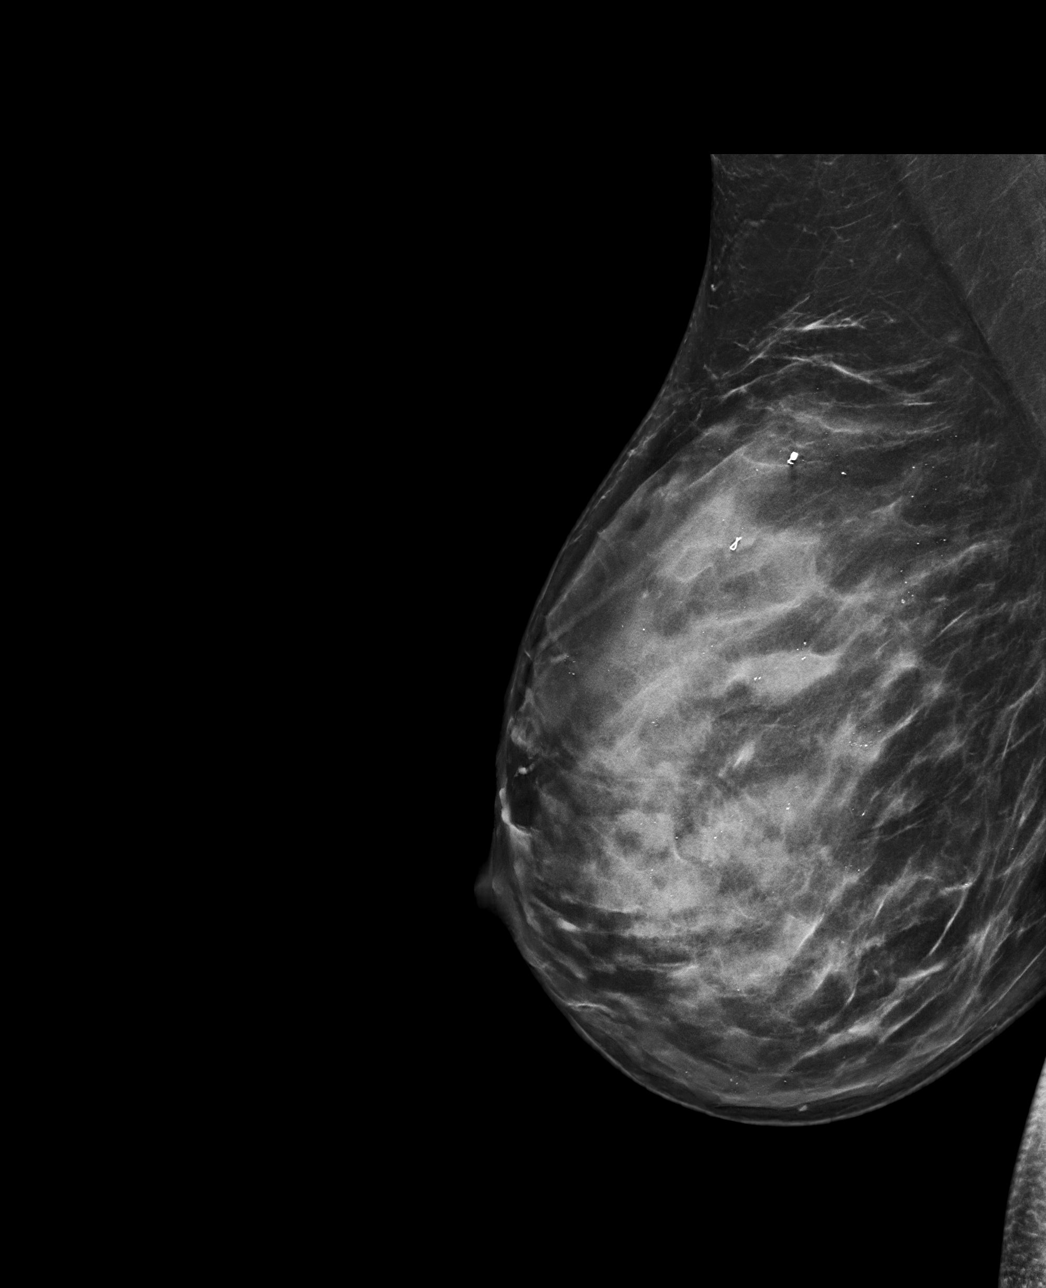

[R CC tomo · tomo slice 41/81.0]
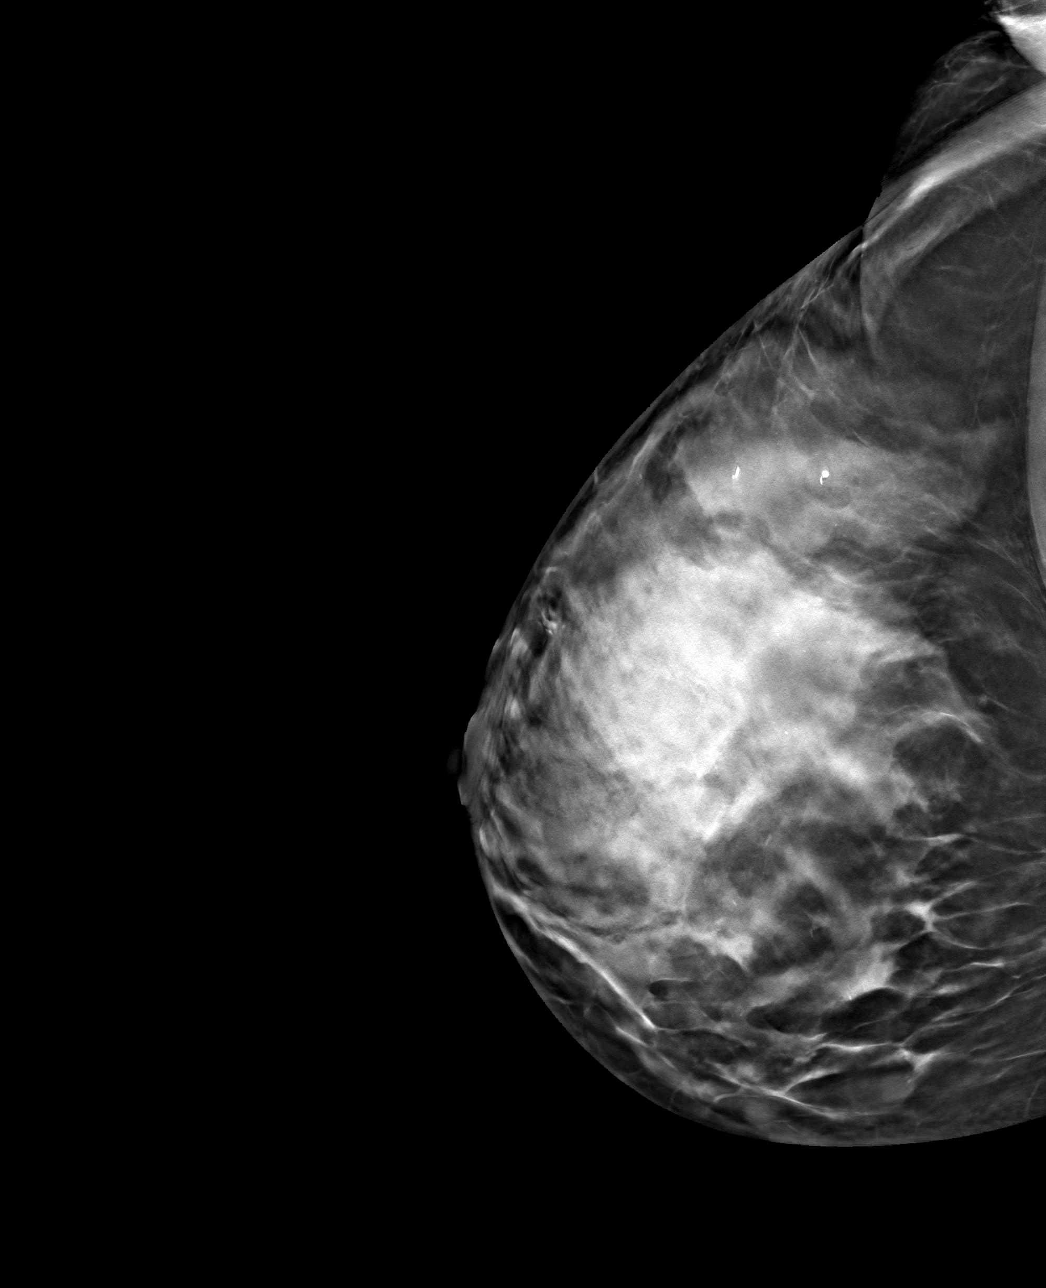

[R ML tomo · tomo slice 41/82.0]
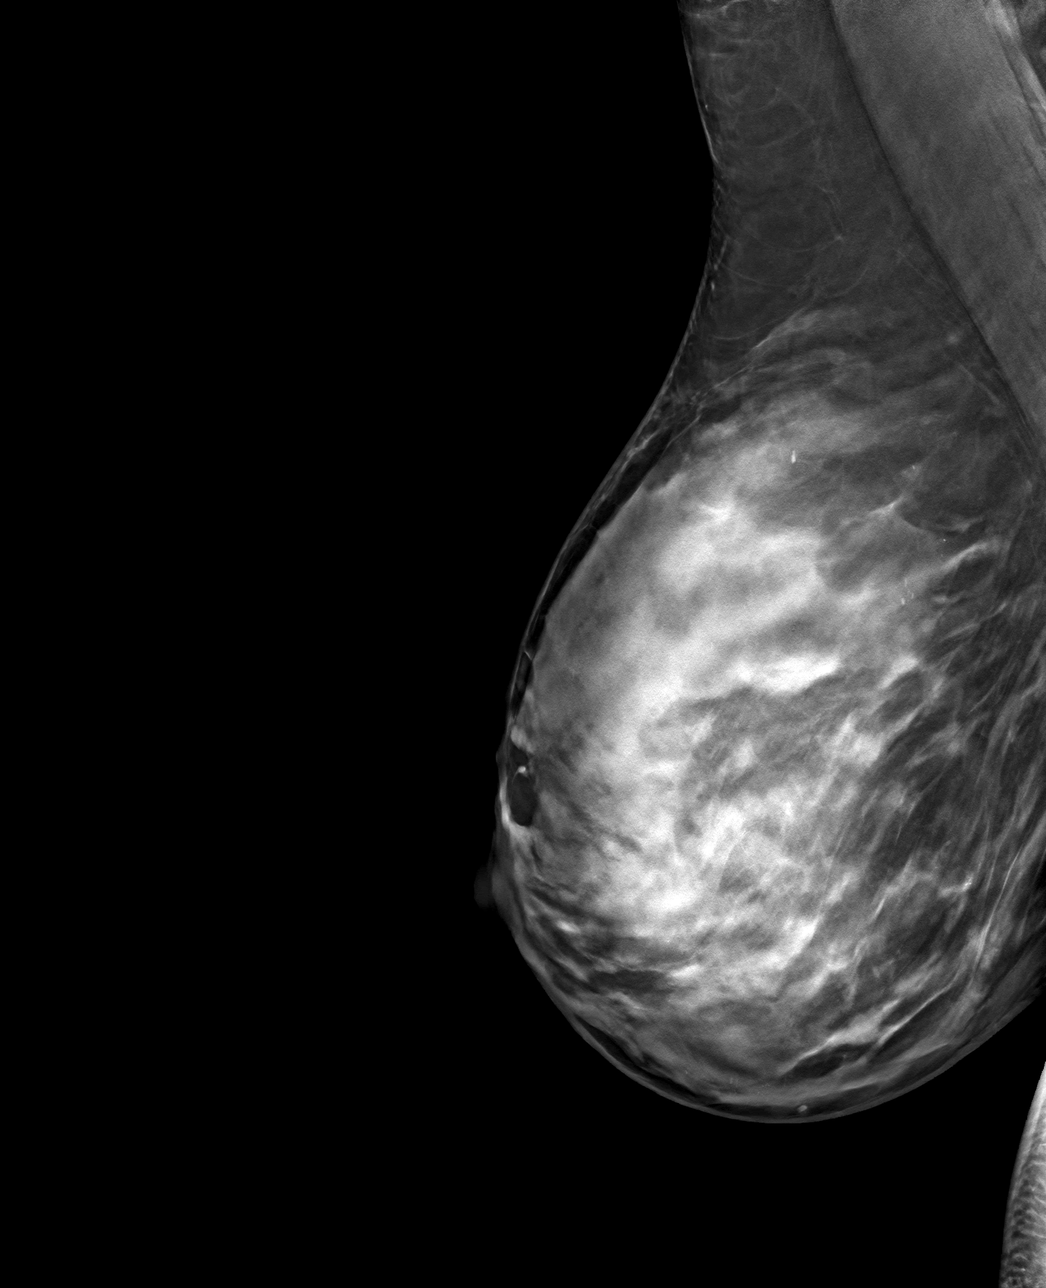

[4 of 12 positions shown; findings below may reference images not displayed]

FINDINGS: 3D Mammographic images were obtained following ultrasound guided
biopsy of the right breast. The biopsy marking clips are in expected
position at the site of biopsy.
IMPRESSION: Appropriate positioning of the ribbon and coil shaped biopsy marking
clips at the site of biopsy in the upper-outer right breast.

Final Assessment: Post Procedure Mammograms for Marker Placement

## 2021-04-04 ENCOUNTER — Other Ambulatory Visit

## 2021-04-04 ENCOUNTER — Encounter

## 2021-04-07 ENCOUNTER — Ambulatory Visit

## 2021-04-08 ENCOUNTER — Other Ambulatory Visit: Payer: Self-pay | Admitting: Student

## 2021-04-08 DIAGNOSIS — G8929 Other chronic pain: Secondary | ICD-10-CM

## 2021-04-10 ENCOUNTER — Other Ambulatory Visit: Payer: Self-pay

## 2021-04-10 ENCOUNTER — Ambulatory Visit
Admission: RE | Admit: 2021-04-10 | Discharge: 2021-04-10 | Disposition: A | Discharge status: Home / Self Care | Source: Ambulatory Visit | Attending: Student | Admitting: Student

## 2021-04-10 DIAGNOSIS — G8929 Other chronic pain: Secondary | ICD-10-CM

## 2021-04-10 IMAGING — MR MR THORACIC SPINE W/O CM
5 of 6 series · 24 of 48 positions shown · non-contrast
Comparison: None.

CLINICAL DATA: Chronic left-sided thoracic pain. Left arm pain and
numbness

EXAM:
MRI THORACIC SPINE WITHOUT CONTRAST
TECHNIQUE: Multiplanar, multisequence MR imaging of the thoracic spine was
performed. No intravenous contrast was administered.

[Series 16: t1_tse_sag count · sagittal · 3.0mm · 1.06mm/px · 1 of 13 slices shown]
[im 1/13]
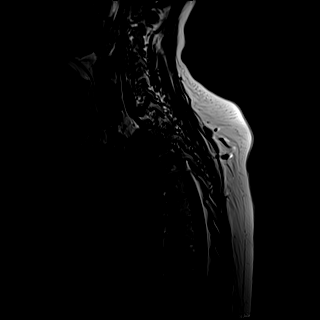

[Series 17: T1 · sagittal · 3.0mm · 0.83mm/px · 5 of 15 slices shown]
[im 1/15]
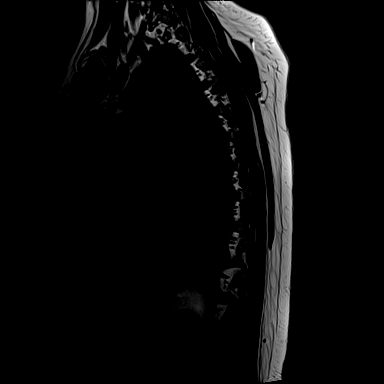
[im 4/15]
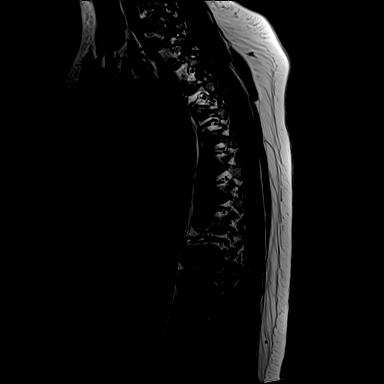
[im 8/15]
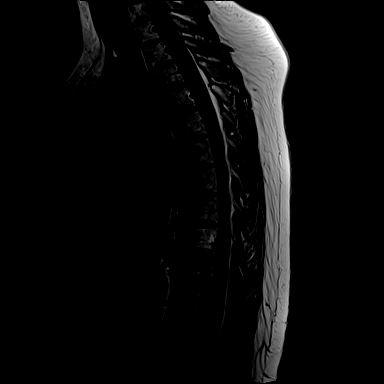
[im 11/15]
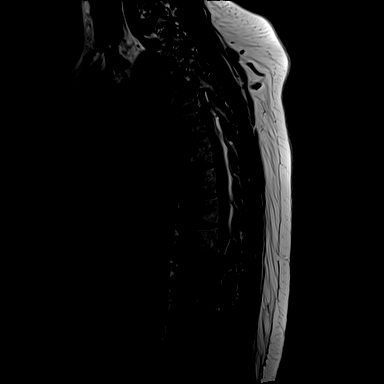
[im 15/15]
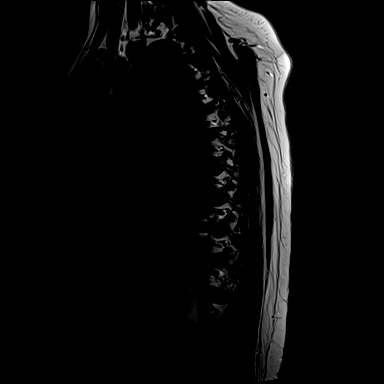

[Series 18: STIR · sagittal · 3.0mm · 1.00mm/px · 5 of 15 slices shown]
[im 1/15]
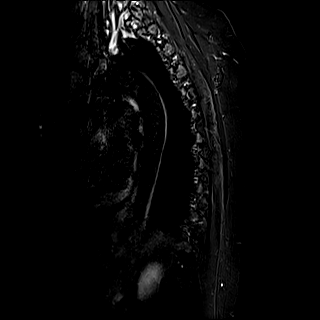
[im 4/15]
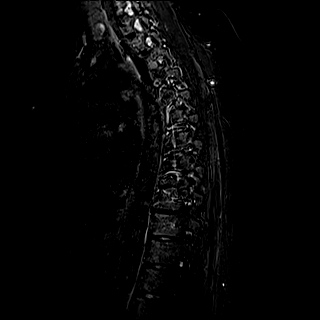
[im 8/15]
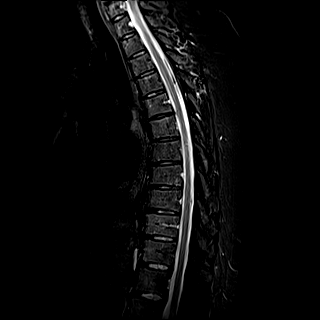
[im 11/15]
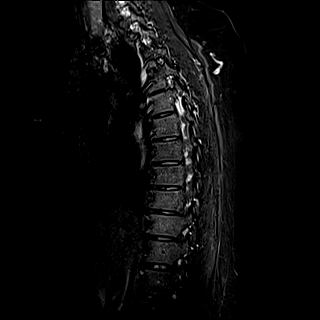
[im 15/15]
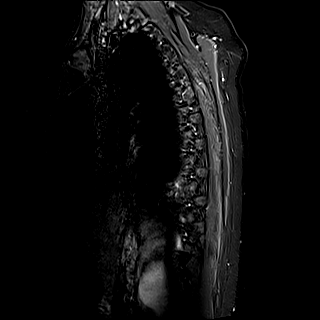

[Series 19: T2 · sagittal · 3.0mm · 0.83mm/px · 5 of 15 slices shown (1 of 2)]
[im 1/15]
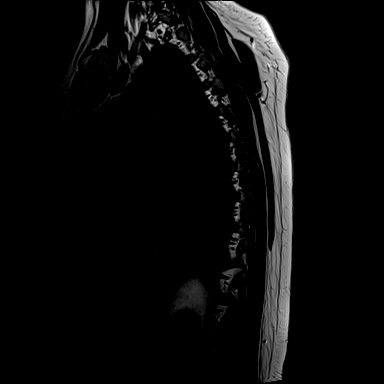
[im 4/15]
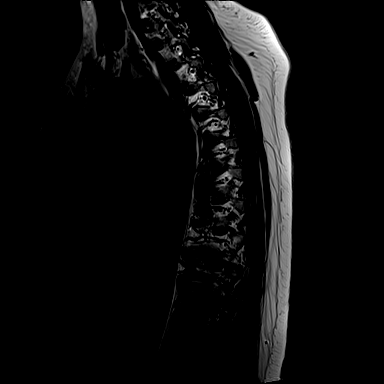
[im 8/15]
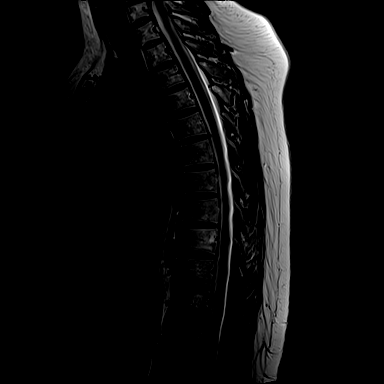
[im 11/15]
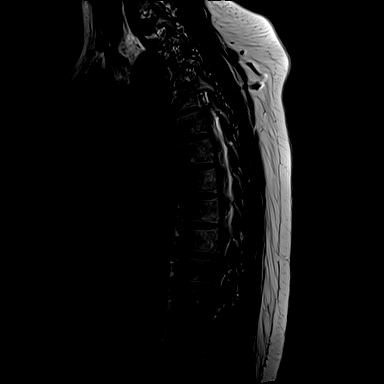
[im 15/15]
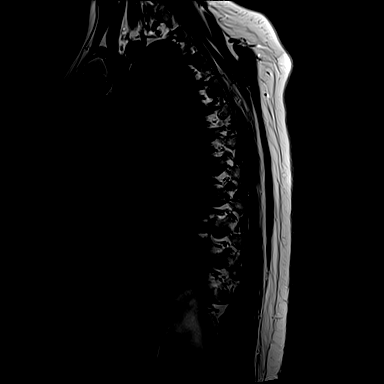

[Series 21: T2 · axial · 4.0mm · 0.28mm/px · z∈[-262,-20]mm · 8 of 39 slices shown (2 of 2)]
[im 1/39]
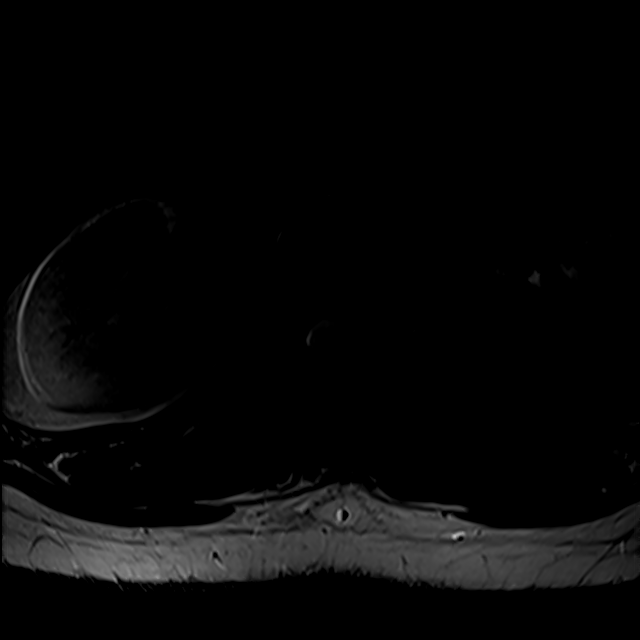
[im 6/39]
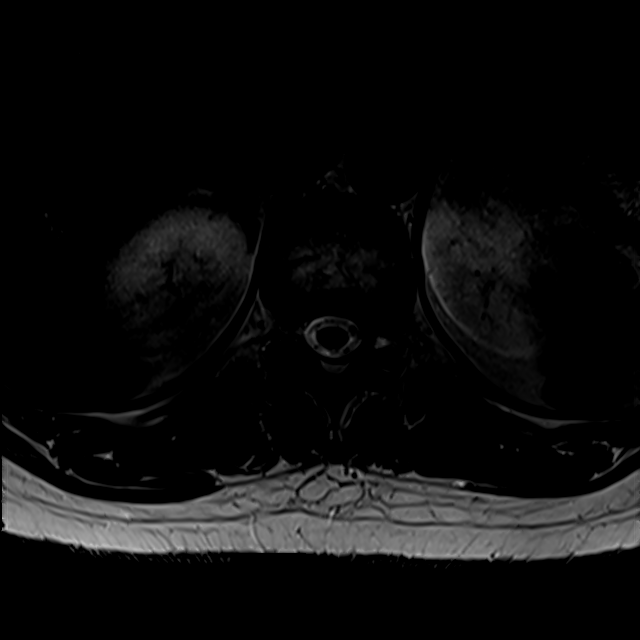
[im 12/39]
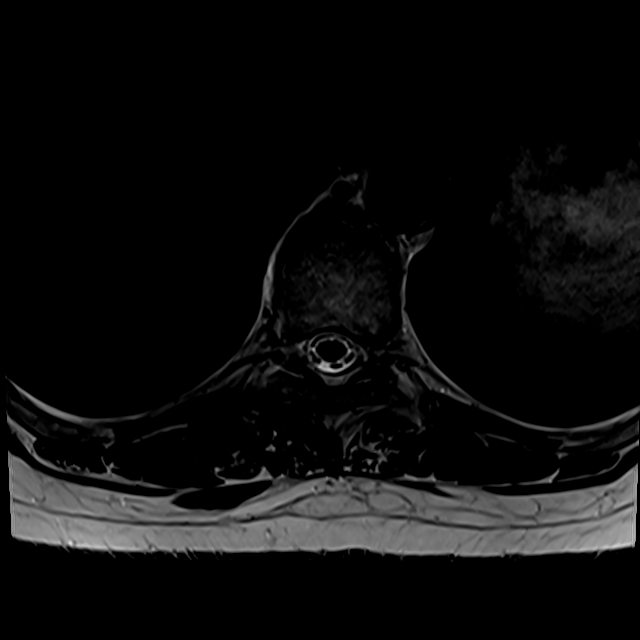
[im 18/39]
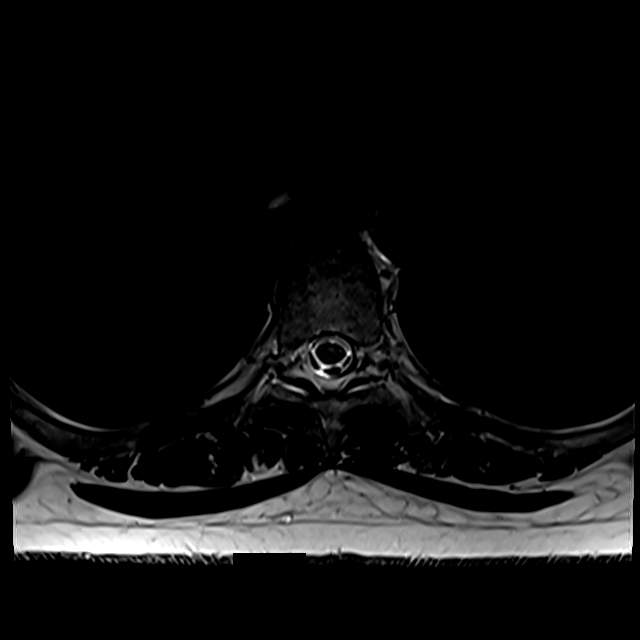
[im 21/39]
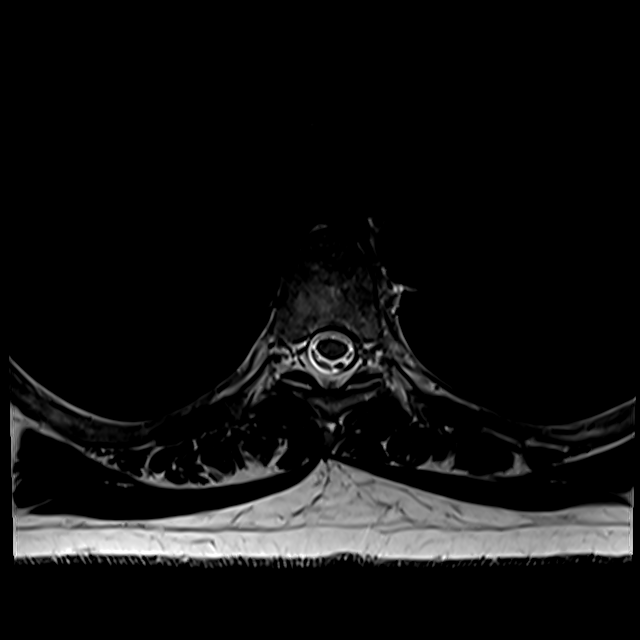
[im 27/39]
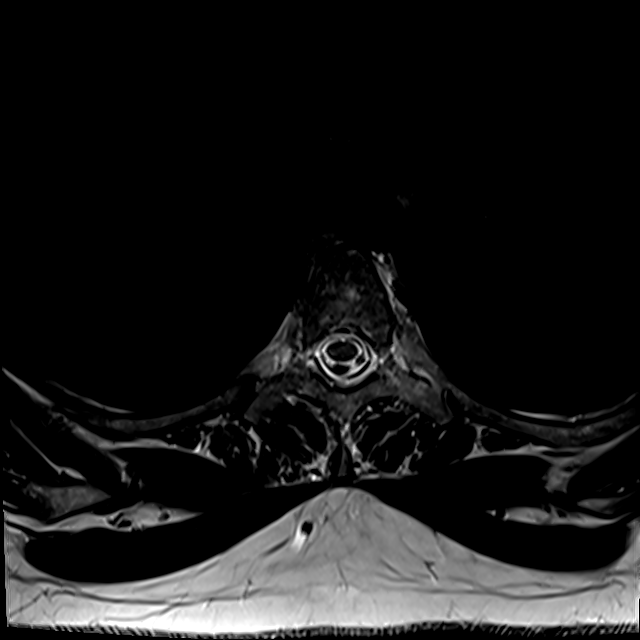
[im 33/39]
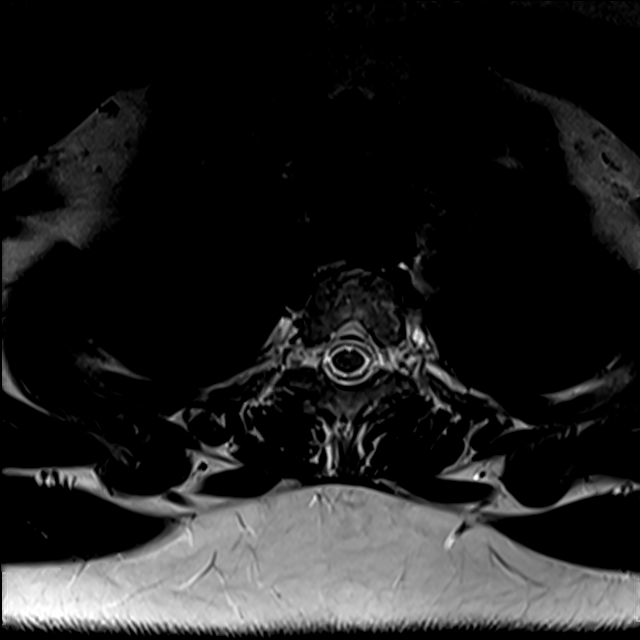
[im 39/39]
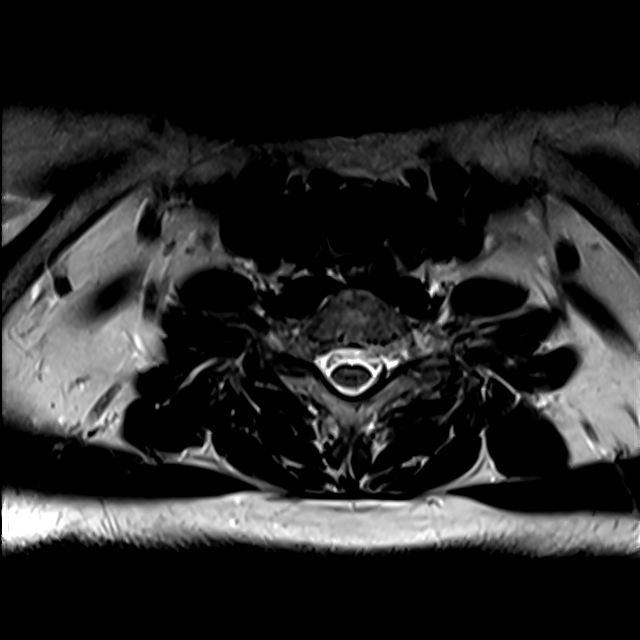

[24 of 48 positions shown; findings below may reference images not displayed]

FINDINGS: Alignment:  Normal

Vertebrae: Normal bone marrow.  Negative for fracture or mass.

Cord:  Normal signal and morphology

Paraspinal and other soft tissues: Negative for paraspinous mass or
fluid collection.

Disc levels:

Normal disc spaces. No significant disc degeneration. Negative for
disc protrusion or neural impingement.
IMPRESSION: Negative MRI thoracic spine

## 2021-04-15 ENCOUNTER — Other Ambulatory Visit

## 2021-05-01 ENCOUNTER — Encounter: Payer: Self-pay | Admitting: Hematology and Oncology

## 2021-05-02 ENCOUNTER — Telehealth: Payer: Self-pay | Admitting: Hematology and Oncology

## 2021-05-02 NOTE — Telephone Encounter (Signed)
Per 4/14 in basket called pt and spoke to pt about appointment.  Pt confirmed appointment.  ?

## 2021-05-05 NOTE — Progress Notes (Signed)
? ?Patient Care Team: ?Patient, [name] as PCP - General (General Practice) ?Rockwell Germany, RN as Oncology Nurse Navigator ?Mauro Kaufmann, RN as Oncology Nurse Navigator ?Jovita Kussmaul, MD as Consulting Physician (General Surgery) ?Nicholas Lose, MD as Consulting Physician (Hematology and Oncology) ?Kyung Rudd, MD as Consulting Physician (Radiation Oncology) ? ?DIAGNOSIS:  ?Encounter Diagnosis  ?Name Primary?  ? Malignant neoplasm of upper-outer quadrant of left breast in female, estrogen receptor positive ([place])   ? ? ?SUMMARY OF ONCOLOGIC HISTORY: ?Oncology History  ?Malignant neoplasm of upper-outer quadrant of left breast in female, estrogen receptor positive ([place])  ?10/28/2020 Initial Diagnosis  ? Presented with new focal thickening and pain her outer left breast. Diagnostic mammogram and [place]: indeterminate mass in the left breast at 3:00. 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  ?Breast MRI: 1 prominent lymph node and 9 mm mass ? ? ?  ?11/06/2020 Genetic Testing  ? Negative hereditary cancer genetic testing: no pathogenic variants detected in [facility] BRCAPlus Panel and [facility] CancerNext-Expanded +RNAinsight Panel. The report dates are November 06, 2020 and November 10, 2020, respectively.  ? ?The BRCAplus panel offered by [employer] and includes sequencing and deletion/duplication analysis for the following 8 genes: ATM, BRCA1, BRCA2, CDH1, CHEK2, PALB2, PTEN, and TP53.  The CancerNext-Expanded gene panel offered by [facility] and includes sequencing, rearrangement, and RNA analysis for the following 77 genes: AIP, ALK, APC, ATM, AXIN2, BAP1, BARD1, BLM, BMPR1A, BRCA1, BRCA2, BRIP1, CDC73, CDH1, CDK4, CDKN1B, CDKN2A, CHEK2, CTNNA1, DICER1, FANCC, FH, FLCN, GALNT12, KIF1B, LZTR1, MAX, MEN1, MET, MLH1, MSH2, MSH3, MSH6, MUTYH, NBN, NF1, NF2, NTHL1, PALB2, PHOX2B, PMS2, POT1, PRKAR1A, PTCH1, PTEN, RAD51C, RAD51D, RB1, RECQL,  RET, SDHA, SDHAF2, SDHB, SDHC, SDHD, SMAD4, SMARCA4, SMARCB1, SMARCE1, STK11, SUFU, TMEM127, TP53, TSC1, TSC2, VHL and XRCC2 (sequencing and deletion/duplication); EGFR, EGLN1, HOXB13, KIT, MITF, PDGFRA, POLD1, and POLE (sequencing only); EPCAM and GREM1 (deletion/duplication only).  ?  ?11/29/2020 Surgery  ? 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  ?12/11/2020 Oncotype testing  ? Oncotype score: 20, distant recurrence at 9 years: 6% ?  ? ? ?CHIEF COMPLIANT: Follow-up on antiestrogen therapy  ? ?INTERVAL HISTORY: Gabriela Evans is a 41 y.o. with above-mentioned history of breast cancer. ?She presents to the clinic today for follow-up.  Rash on breast, wrists started 10 days after starting Tamoxifen. ? ?ALLERGIES:  is allergic to nickel, other, tramadol, and oxycodone-acetaminophen. ? ?MEDICATIONS:  ?Current Outpatient Medications  ?Medication Sig Dispense Refill  ? acetaminophen (TYLENOL) 500 MG tablet Take 500 mg by mouth every 6 (six) hours as needed.    ? BLACK ELDERBERRY,BERRY-FLOWER, PO Take 1 capsule by mouth daily.    ? Charcoal Activated (ACTIVATED CHARCOAL) POWD by Does not apply route as needed.    ? ibuprofen (ADVIL) 400 MG tablet Take 400 mg by mouth every 6 (six) hours as needed.    ? magic mouthwash (lidocaine, diphenhydrAMINE, alum & mag hydroxide) suspension Swish and swallow 5 mls by mouth 3 times a day as needed 240 mL 1  ? Medium Chain Triglycerides (MCT OIL PO) Take by mouth daily.    ? Multiple Vitamins-Minerals (MULTIVITAMIN WITH MINERALS) tablet Take 2 tablets by mouth daily.    ? Probiotic Product (PROBIOTIC PO) Take 1 tablet by mouth daily.    ? tamoxifen (NOLVADEX) 20 MG tablet Take 1  tablet (20 mg total) by mouth daily. 90 tablet 3  ? Theanine 200 MG CAPS Take by mouth.    ? vitamin C (ASCORBIC ACID) 250 MG tablet Take 250 mg by mouth daily.    ? vitamin E 1000 UNIT capsule Take  1,000 Units by mouth daily.    ? ?No current facility-administered medications for this visit.  ? ? ?PHYSICAL EXAMINATION: ?ECOG PERFORMANCE STATUS: 1 - Symptomatic but completely ambulatory ? ?There were no vitals filed for this visit. ?There were no vitals filed for this visit. ?  ? ?LABORATORY DATA:  ?I have reviewed the data as listed ? ?  Latest Ref Rng & Units 02/18/2021  ?  2:22 PM 10/30/2020  ? 12:11 PM 10/08/2020  ? 12:29 PM  ?CMP  ?Glucose 70 - 99 mg/dL 89   95   116    ?BUN 6 - 20 mg/dL '10   12   14    ' ?Creatinine 0.44 - 1.00 mg/dL 0.70   0.67   0.72    ?Sodium 135 - 145 mmol/L 139   142   139    ?Potassium 3.5 - 5.1 mmol/L 3.6   4.0   3.8    ?Chloride 98 - 111 mmol/L 105   110   105    ?CO2 22 - 32 mmol/L '28   25   25    ' ?Calcium 8.9 - 10.3 mg/dL 9.4   9.8   9.5    ?Total Protein 6.5 - 8.1 g/dL 7.2   7.6   6.8    ?Total Bilirubin 0.3 - 1.2 mg/dL 0.3   0.4   0.3    ?Alkaline Phos 38 - 126 U/L 47   46     ?AST 15 - 41 U/L '16   17   16    ' ?ALT 0 - 44 U/L '14   19   14    ' ? ? ?Lab Results  ?Component Value Date  ? WBC 5.8 02/18/2021  ? HGB 12.3 02/18/2021  ? HCT 36.2 02/18/2021  ? MCV 88.5 02/18/2021  ? PLT 262 02/18/2021  ? NEUTROABS 3.7 02/18/2021  ? ? 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  ?Pathology counseling: I discussed the final pathology report of the patient provided  a copy of this report. I discussed the margins.  We also discussed the final staging along with previously performed ER/PR testing. ?  ?Treatment plan: ?1. Oncotype DX: Low risk no role of chemo ?2. Adjuvant radiation therapy completed 03/03/2021 ?3. Adjuvant antiestrogen therapy started  03/19/2021 ?-------------------------------------------------------------------------------------------------------------- ?Tamoxifen toxicities: Allergy reaction with a rash on the dorsum of her hands and wrists ?The rash is going away after she stopped tamoxifen. ? ?We discussed that we should not resume tamoxifen. ?Recommendation: Ovarian function suppression with aromatase inhibitor. ?Patient is keen on doing an oophorectomy instead of ovarian function suppression.  (She has a lot of distance to travel for these injections and that the reason for choosing oophorectomy instead) ? ?She will inform me in the next couple of days about her decision. ?I will reach out to her gynecologist Dr. Juleen China to discuss oophorectomy at that time. ? ?Breast cancer surveillance: ?1.  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  No malignancy ? ?Dr.Tiffany Juleen China is her Gynecologist ? ?Return to clinic in 1 year for follow-up ? ? ? ?No orders of the defined types were placed in this encounter. ? ?The patient has a good understanding of the overall plan. she agrees with it. she will call with any problems that may develop before the next visit here. ?Total time spent: 30 mins including face to face time and time spent for planning, charting and co-ordination of care ? ? Harriette Ohara, MD ?05/19/21 ? ? ? I Gardiner Coins am scribing for Dr. Lindi Adie ? ?I have reviewed the above documentation for accuracy and completeness, and I agree with the above. ?  ?

## 2021-05-19 ENCOUNTER — Inpatient Hospital Stay: Attending: Hematology and Oncology | Admitting: Hematology and Oncology

## 2021-05-19 DIAGNOSIS — Z923 Personal history of irradiation: Secondary | ICD-10-CM | POA: Insufficient documentation

## 2021-05-19 DIAGNOSIS — Z170 Estrogen receptor positive status [ER+]: Secondary | ICD-10-CM | POA: Insufficient documentation

## 2021-05-19 DIAGNOSIS — Z79899 Other long term (current) drug therapy: Secondary | ICD-10-CM | POA: Insufficient documentation

## 2021-05-19 DIAGNOSIS — C50412 Malignant neoplasm of upper-outer quadrant of left female breast: Secondary | ICD-10-CM | POA: Diagnosis not present

## 2021-05-19 DIAGNOSIS — Z79810 Long term (current) use of selective estrogen receptor modulators (SERMs): Secondary | ICD-10-CM | POA: Insufficient documentation

## 2021-05-19 NOTE — Assessment & Plan Note (Signed)
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 ?Pathology counseling: I discussed the final pathology report of the patient provided ?a copy of this report. I discussed the margins. ?We also discussed the final staging along with previously performed ER/PR testing. ?? ?Treatment plan: ?1. Oncotype DX: Low risk no role of chemo ?2. Adjuvant radiation therapy completed 03/03/2021 ?3. Adjuvant antiestrogen therapy?started 03/19/2021 ?-------------------------------------------------------------------------------------------------------------- ?Tamoxifen toxicities: ? ?Breast cancer surveillance: ?1.  Breast cancer surveillance: Right breast mammogram and ultrasound 03/19/2021: Probable benign masses in the right breast biopsy: Fibroadenoma and PASH.  No malignancy ? ?She has a survivorship appointment coming up. ?Return to clinic in 1 year for follow-up ?

## 2021-05-20 ENCOUNTER — Telehealth: Payer: Self-pay

## 2021-05-20 ENCOUNTER — Encounter: Payer: Self-pay | Admitting: Hematology and Oncology

## 2021-05-20 ENCOUNTER — Encounter: Payer: Self-pay | Admitting: *Deleted

## 2021-05-20 NOTE — Telephone Encounter (Signed)
Please schedule OV with Dr. Dellis Filbert to discuss. She did her prior hysterectomy in 2021.  ?

## 2021-05-20 NOTE — Telephone Encounter (Signed)
Gabriela Evans from Dr. Geralyn Flash office at [facility] called to notify us about pt's treatment plan and the need for an appt with [place]. She reports that patient did not received Anti-estrogen therapy well, needs ovarian suppression but pt also does not desire monthly injections so she wants to have bilateral oophorectomy. Reported pt needed an OV with [place] to discuss procedure. Please advise.  ?

## 2021-05-20 NOTE — Progress Notes (Signed)
Per MD request, RN placed call to Marny Lowenstein DNP with GYN regarding pt decision to undergo oophorectomy.  Triage nurse with [facility] notified and states she will alert Marny Lowenstein DNP.  ?

## 2021-05-20 NOTE — Telephone Encounter (Signed)
FYI. Pt scheduled for 06/09/21.  ?

## 2021-05-20 NOTE — Telephone Encounter (Signed)
Pt notified and voiced understanding. Will send msg to scheduling.  ?

## 2021-05-28 ENCOUNTER — Encounter: Payer: Self-pay | Admitting: Hematology and Oncology

## 2021-05-29 ENCOUNTER — Inpatient Hospital Stay (HOSPITAL_BASED_OUTPATIENT_CLINIC_OR_DEPARTMENT_OTHER): Admitting: Hematology and Oncology

## 2021-05-29 ENCOUNTER — Other Ambulatory Visit: Payer: Self-pay

## 2021-05-29 DIAGNOSIS — Z170 Estrogen receptor positive status [ER+]: Secondary | ICD-10-CM

## 2021-05-29 DIAGNOSIS — Z79899 Other long term (current) drug therapy: Secondary | ICD-10-CM | POA: Diagnosis not present

## 2021-05-29 DIAGNOSIS — C50412 Malignant neoplasm of upper-outer quadrant of left female breast: Secondary | ICD-10-CM | POA: Diagnosis present

## 2021-05-29 DIAGNOSIS — Z923 Personal history of irradiation: Secondary | ICD-10-CM | POA: Diagnosis not present

## 2021-05-29 DIAGNOSIS — Z79810 Long term (current) use of selective estrogen receptor modulators (SERMs): Secondary | ICD-10-CM | POA: Diagnosis not present

## 2021-05-29 MED ORDER — AMOXICILLIN-POT CLAVULANATE 875-125 MG PO TABS: 1.0000 | ORAL_TABLET | Freq: Two times a day (BID) | ORAL | 0 refills | Status: DC

## 2021-05-29 NOTE — Progress Notes (Signed)
? ?Patient Care Team: ?Patient, [name] as PCP - General (General Practice) ?Rockwell Germany, RN as Oncology Nurse Navigator ?Mauro Kaufmann, RN as Oncology Nurse Navigator ?Jovita Kussmaul, MD as Consulting Physician (General Surgery) ?Nicholas Lose, MD as Consulting Physician (Hematology and Oncology) ?Kyung Rudd, MD as Consulting Physician (Radiation Oncology) ? ?DIAGNOSIS:  ?Encounter Diagnosis  ?Name Primary?  ? Malignant neoplasm of upper-outer quadrant of left breast in female, estrogen receptor positive ([place])   ? ? ?SUMMARY OF ONCOLOGIC HISTORY: ?Oncology History  ?Malignant neoplasm of upper-outer quadrant of left breast in female, estrogen receptor positive ([place])  ?10/28/2020 Initial Diagnosis  ? Presented with new focal thickening and pain her outer left breast. Diagnostic mammogram and [place]: indeterminate mass in the left breast at 3:00. 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  ?Breast MRI: 1 prominent lymph node and 9 mm mass ? ? ?  ?11/06/2020 Genetic Testing  ? Negative hereditary cancer genetic testing: no pathogenic variants detected in [facility] BRCAPlus Panel and [facility] CancerNext-Expanded +RNAinsight Panel. The report dates are November 06, 2020 and November 10, 2020, respectively.  ? ?The BRCAplus panel offered by [employer] and includes sequencing and deletion/duplication analysis for the following 8 genes: ATM, BRCA1, BRCA2, CDH1, CHEK2, PALB2, PTEN, and TP53.  The CancerNext-Expanded gene panel offered by [facility] and includes sequencing, rearrangement, and RNA analysis for the following 77 genes: AIP, ALK, APC, ATM, AXIN2, BAP1, BARD1, BLM, BMPR1A, BRCA1, BRCA2, BRIP1, CDC73, CDH1, CDK4, CDKN1B, CDKN2A, CHEK2, CTNNA1, DICER1, FANCC, FH, FLCN, GALNT12, KIF1B, LZTR1, MAX, MEN1, MET, MLH1, MSH2, MSH3, MSH6, MUTYH, NBN, NF1, NF2, NTHL1, PALB2, PHOX2B, PMS2, POT1, PRKAR1A, PTCH1, PTEN, RAD51C, RAD51D, RB1, RECQL,  RET, SDHA, SDHAF2, SDHB, SDHC, SDHD, SMAD4, SMARCA4, SMARCB1, SMARCE1, STK11, SUFU, TMEM127, TP53, TSC1, TSC2, VHL and XRCC2 (sequencing and deletion/duplication); EGFR, EGLN1, HOXB13, KIT, MITF, PDGFRA, POLD1, and POLE (sequencing only); EPCAM and GREM1 (deletion/duplication only).  ?  ?11/29/2020 Surgery  ? 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  ?12/11/2020 Oncotype testing  ? Oncotype score: 20, distant recurrence at 9 years: 6% ?  ? ? ?CHIEF COMPLIANT: Follow-up on antiestrogen therapy  ? ?INTERVAL HISTORY: GRACE VALLEY is a 41 y.o. with above-mentioned history of breast cancer. ?She presents to the clinic today for follow-up. She concern about a strain muscle on the left side of her back. She complains of fatigue and she complains of swelling on the surgery side. She concern about her glucose has been 160. She state she felt weird. She concern about redness and a thick patch on her chest. She state that its hot to the touch. Complains of hot flashes. She state that her mood swings has increase over the last few weeks. ? ? ?ALLERGIES:  is allergic to nickel, other, tramadol, oxycodone-acetaminophen, and tamoxifen. ? ?MEDICATIONS:  ?Current Outpatient Medications  ?Medication Sig Dispense Refill  ? amoxicillin-clavulanate (AUGMENTIN) 875-125 MG tablet Take 1 tablet by mouth 2 (two) times daily. 14 tablet 0  ? acetaminophen (TYLENOL) 500 MG tablet Take 500 mg by mouth every 6 (six) hours as needed.    ? BLACK ELDERBERRY,BERRY-FLOWER, PO Take 1 capsule by mouth daily.    ? Charcoal Activated (ACTIVATED CHARCOAL) POWD by Does not apply route as needed.    ? gabapentin (NEURONTIN) 100 MG capsule Take 100 mg by mouth 3 (three) times  daily.    ? ibuprofen (ADVIL) 400 MG tablet Take 400 mg by mouth every 6 (six) hours as needed.    ? magic mouthwash (lidocaine, diphenhydrAMINE, alum & mag hydroxide)  suspension Swish and swallow 5 mls by mouth 3 times a day as needed 240 mL 1  ? Medium Chain Triglycerides (MCT OIL PO) Take by mouth daily.    ? Multiple Vitamins-Minerals (MULTIVITAMIN WITH MINERALS) tablet Take 2 tablets by mouth daily.    ? Probiotic Product (PROBIOTIC PO) Take 1 tablet by mouth daily.    ? tamoxifen (NOLVADEX) 20 MG tablet Take 1 tablet (20 mg total) by mouth daily. 90 tablet 3  ? Theanine 200 MG CAPS Take by mouth.    ? vitamin C (ASCORBIC ACID) 250 MG tablet Take 250 mg by mouth daily.    ? vitamin E 1000 UNIT capsule Take 1,000 Units by mouth daily.    ? ?No current facility-administered medications for this visit.  ? ? ?PHYSICAL EXAMINATION: ?ECOG PERFORMANCE STATUS: 1 - Symptomatic but completely ambulatory ? ?Vitals:  ? 05/29/21 1058  ?BP: 120/88  ?Pulse: 84  ?Resp: 18  ?Temp: 97.7 ?F (36.5 ?C)  ?SpO2: 100%  ? ?Filed Weights  ? 05/29/21 1058  ?Weight: 214 lb 4.8 oz (97.2 kg)  ? ? ?BREAST: Pinkish discoloration of the left breast with slight lymphedema in the lower medial aspect of the breast.  (exam performed in the presence of a chaperone) ? ?LABORATORY DATA:  ?I have reviewed the data as listed ? ?  Latest Ref Rng & Units 02/18/2021  ?  2:22 PM 10/30/2020  ? 12:11 PM 10/08/2020  ? 12:29 PM  ?CMP  ?Glucose 70 - 99 mg/dL 89   95   116    ?BUN 6 - 20 mg/dL '10   12   14    ' ?Creatinine 0.44 - 1.00 mg/dL 0.70   0.67   0.72    ?Sodium 135 - 145 mmol/L 139   142   139    ?Potassium 3.5 - 5.1 mmol/L 3.6   4.0   3.8    ?Chloride 98 - 111 mmol/L 105   110   105    ?CO2 22 - 32 mmol/L '28   25   25    ' ?Calcium 8.9 - 10.3 mg/dL 9.4   9.8   9.5    ?Total Protein 6.5 - 8.1 g/dL 7.2   7.6   6.8    ?Total Bilirubin 0.3 - 1.2 mg/dL 0.3   0.4   0.3    ?Alkaline Phos 38 - 126 U/L 47   46     ?AST 15 - 41 U/L '16   17   16    ' ?ALT 0 - 44 U/L '14   19   14    ' ? ? ?Lab Results  ?Component Value Date  ? WBC 5.8 02/18/2021  ? HGB 12.3 02/18/2021  ? HCT 36.2 02/18/2021  ? MCV 88.5 02/18/2021  ? PLT 262 02/18/2021  ?  NEUTROABS 3.7 02/18/2021  ? ? 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  ?Pathology counseling: I discussed the final pathology report of the patient provided  a copy of this report. I  discussed the margins.  We also discussed the final staging along with previously performed ER/PR testing. ?  ?Treatment plan: ?1. Oncotype DX: Low risk no role of chemo ?2. Adjuvant radiation therapy completed 03/03/2021 ?3. Adjuvant antiestrogen therapy started 03/19/2021 ?-------------------------------------------------------------------------------------------------------------- ?Tamoxifen toxicities: Allergy reaction with a rash on the dorsum of her hands and wrists, discontinued 05/04/2018 2020 ? ?Left breast swelling with heat and slight redness: I sent an antibiotic prescription for Augmentin.  If it does not get better by the time she comes back to see Mendel Ryder for survivorship, then she will need an ultrasound.  I requested an ultrasound appointment to be placed right after follow-up with Mendel Ryder. ? ?Left breast lymphedema: Sent a prescription for physical therapy. ?    ?Breast cancer surveillance: ?Left breast swelling and redness: Being treated with antibiotics ?  ?Dr.Tiffany Juleen China is her Gynecologist ?  ?Return to clinic in 1 year for follow-up ?  ? ? ?Orders Placed This Encounter  ?Procedures  ? US BREAST LTD UNI LEFT INC AXILLA  ?  Standing Status:   Future  ?  Standing Expiration Date:   05/30/2022  ?  Scheduling Instructions:  ?   Please schedule this on 06/03/21 at 1 PM (out of town patient) trying to coordinate her care  ?  Order Specific Question:   Reason for Exam (SYMPTOM  OR DIAGNOSIS REQUIRED)  ?  Answer:   left breast  swelling  ?  Order Specific Question:   Preferred imaging location?  ?  Answer:   [facility]  ?  Order Specific Question:   Release to patient  ?  Answer:   Immediate  ? Ambulatory referral to Physical

## 2021-05-29 NOTE — Assessment & Plan Note (Addendum)
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 ?Pathology counseling: I discussed the final pathology report of the patient provided ?a copy of this report. I discussed the margins. ?We also discussed the final staging along with previously performed ER/PR testing. ?? ?Treatment plan: ?1. Oncotype DX: Low risk no role of chemo ?2. Adjuvant radiation therapy completed 03/03/2021 ?3. Adjuvant antiestrogen therapy?started 03/19/2021 ?-------------------------------------------------------------------------------------------------------------- ?Tamoxifen toxicities: Allergy reaction with a rash on the dorsum of her hands and wrists, discontinued 05/04/2018 2020 ? ?Left breast swelling with heat and slight redness: I sent an antibiotic prescription for Augmentin.  If it does not get better by the time she comes back to see Mendel Ryder for survivorship, then she will need an ultrasound.  I requested an ultrasound appointment to be placed right after follow-up with Mendel Ryder. ? ?Left breast lymphedema: Sent a prescription for physical therapy. ?? ? ?Breast cancer surveillance: ?Left breast swelling and redness: Being treated with antibiotics ?? ?Dr.Tiffany Juleen China is her Editor, commissioning ?? ?Return to clinic in 1 year for follow-up ?? ?

## 2021-05-30 ENCOUNTER — Telehealth: Payer: Self-pay | Admitting: *Deleted

## 2021-06-03 ENCOUNTER — Other Ambulatory Visit: Payer: Self-pay

## 2021-06-03 ENCOUNTER — Ambulatory Visit: Attending: Hematology and Oncology | Admitting: Rehabilitation

## 2021-06-03 ENCOUNTER — Inpatient Hospital Stay (HOSPITAL_BASED_OUTPATIENT_CLINIC_OR_DEPARTMENT_OTHER): Admitting: Adult Health

## 2021-06-03 ENCOUNTER — Encounter: Admitting: Adult Health

## 2021-06-03 ENCOUNTER — Encounter: Payer: Self-pay | Admitting: Rehabilitation

## 2021-06-03 VITALS — BP 126/91 | HR 65 | Temp 98.0°F | Resp 18 | Ht 69.0 in | Wt 214.7 lb

## 2021-06-03 DIAGNOSIS — Z170 Estrogen receptor positive status [ER+]: Secondary | ICD-10-CM | POA: Insufficient documentation

## 2021-06-03 DIAGNOSIS — C50412 Malignant neoplasm of upper-outer quadrant of left female breast: Secondary | ICD-10-CM | POA: Insufficient documentation

## 2021-06-03 DIAGNOSIS — R600 Localized edema: Secondary | ICD-10-CM | POA: Diagnosis not present

## 2021-06-03 DIAGNOSIS — R293 Abnormal posture: Secondary | ICD-10-CM | POA: Diagnosis present

## 2021-06-03 DIAGNOSIS — M542 Cervicalgia: Secondary | ICD-10-CM | POA: Diagnosis not present

## 2021-06-03 NOTE — Therapy (Signed)
?OUTPATIENT PHYSICAL THERAPY ONCOLOGY EVALUATION ? ?Patient Name: Gabriela Evans ?MRN: [id number] ?DOB:06/30/1980, 41 y.o., female ?Today's Date: 06/03/2021 ? ? PT End of Session - 06/03/21 1306   ? ? Visit Number 1   ? Number of Visits 9   ? Date for PT Re-Evaluation 07/15/21   ? PT Start Time 1000   ? PT Stop Time 1050   has to get to survivorship  ? PT Time Calculation (min) 50 min   ? Activity Tolerance Patient tolerated treatment well   ? Behavior During Therapy [facility] for tasks assessed/performed   ? ?  ?  ? ?  ? ? ?Past Medical History:  ?Diagnosis Date  ? Anemia   ? Breast cancer ([place])   ? Complication of anesthesia   ? nausea  ? Gestational diabetes   ? Kidney infection   ? Other abnormal glucose   ? Gestational diabetes with both children  ? Personal history of radiation therapy   ? Pneumonia   ? 01-2019  ? PONV (postoperative nausea and vomiting)   ? ?Past Surgical History:  ?Procedure Laterality Date  ? BREAST LUMPECTOMY    ? BREAST LUMPECTOMY WITH RADIOACTIVE SEED AND SENTINEL LYMPH NODE BIOPSY Left 11/29/2020  ? Procedure: LEFT BREAST LUMPECTOMY WITH RADIOACTIVE SEED AND SENTINEL LYMPH NODE BIOPSY;  Surgeon: Jovita Kussmaul, MD;  Location: [facility];  Service: General;  Laterality: Left;  ? CESAREAN SECTION    ? X2  ? IUD inserted in OR    ? PELVIC LAPAROSCOPY  2009  ? remove perforated IUD  ? ROBOTIC ASSISTED LAPAROSCOPIC LYSIS OF ADHESION N/A 04/26/2019  ? Procedure: XI ROBOTIC ASSISTED LAPAROSCOPIC LYSIS OF ADHESION;  Surgeon: [name], MD;  Location: [facility];  Service: Gynecology;  Laterality: N/A;  ? ROBOTIC ASSISTED TOTAL HYSTERECTOMY WITH BILATERAL SALPINGO OOPHERECTOMY Bilateral 04/26/2019  ? Procedure: XI ROBOTIC ASSISTED TOTAL LAPAROSCOPIC HYSTERECTOMY WITH BILATERAL SALPINGECTOMY;  Surgeon: [name], MD;  Location: [facility];  Service: Gynecology;  Laterality: Bilateral;  requests 8:30 OR start in [facility] block  time ?requests 2 hours  ? WISDOM TOOTH EXTRACTION    ? ?Patient Active Problem List  ? Diagnosis Date Noted  ? Genetic testing 11/07/2020  ? Malignant neoplasm of upper-outer quadrant of left breast in female, estrogen receptor positive ([place]) 10/28/2020  ? Postoperative state 04/26/2019  ? Post-operative state 04/26/2019  ? Menorrhagia 03/21/2018  ? ? ?PCP: None ? ?REFERRING PROVIDER: Dr Lindi Adie ? ?REFERRING DIAG: C50.412,Z17.0 (ICD-10-CM) - Malignant neoplasm of upper-outer quadrant of left breast in female, estrogen receptor positive ([place]) ? ?THERAPY DIAG:  ?Malignant neoplasm of upper-outer quadrant of left breast in female, estrogen receptor positive ([place]) ? ?Abnormal posture ? ?Cervicalgia ? ?Localized edema ? ?ONSET DATE: 11/29/20 ? ?SUBJECTIVE                                                                                                                                                                                          ? ?  SUBJECTIVE STATEMENT:A neurosurgeon looked at cervical and thoracic spine and said I have some stenosis.  I did some therapy in [place] and am still doing that but more for the neck and tingling.  Then I noticed some tightness in the left lat and swelling in the breast and around the shoulder.  Then some redness in the breast so I got antibiotic.  It is day 4-5 and seems to be helping.   ? ?PERTINENT HISTORY: Lt lumpectomy with 2 negative lymph nodes removed on 11/29/20.  She underwent a partial hysterectomy (ovaries still remain) in 04/2019. Radiation completed.  ? ?PAIN:  ?Are you having pain? No I feel tender to the touch in all the swollen places and my shoulder blade and breast 2/10 ? ?PRECAUTIONS: Left lymphedema precautions ? ?WEIGHT BEARING RESTRICTIONS No ? ?FALLS:  ?Has patient fallen in last 6 months? No ? ?LIVING ENVIRONMENT: ?Lives with: lives with their spouse ?Lives in: House/apartment ? 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 ?LEISURE: limited due to left UE ? ?HAND DOMINANCE : right  ? ?PRIOR LEVEL OF FUNCTION: Independent ? ?PATIENT GOALS focusing on the breast size and swelling ? ? ?OBJECTIVE ? ?COGNITION: ? Overall cognitive status: Within functional limits for tasks assessed  ? ?PALPATION: Fibrosis mild under medial skin changes, moderate fibrosis lateral inferior breast more noticeable in sidelying, tenderness to latissimus with minimal skin excursion here ? ?OBSERVATIONS / OTHER ASSESSMENTS: left breast with peau d'orange skin medial inferior breast, darker from radiation ? ?UPPER EXTREMITY AROM/PROM: ? ?A/PROM RIGHT  12/16/20 ?  ?Shoulder extension   ?Shoulder flexion   ?Shoulder abduction   ?Shoulder internal rotation   ?Shoulder external rotation   ?  (Blank rows = not tested) ? ?A/PROM 10/30/20 LEFT 12/16/20   ?Shoulder extension 52 60   ?Shoulder flexion 149 155   ?Shoulder abduction 162 135   ?Shoulder internal rotation     ?Shoulder external rotation 87 90   ?  (Blank rows = not tested) ? ? ?CERVICAL AROM: ?All within normal limits:  ? ? 12/16/20   ?Flexion 45 pull   ?Extension 50 tightness   ?Right lateral flexion    ?Left lateral flexion    ?Right rotation 60 tension Rt suboccipitals   ?Left rotation 45 tension Lt suboccipitals   ? ?L-DEX LYMPHEDEMA SCREENING: WNL ? ? L-DEX FLOWSHEETS - 06/03/21 1000   ? ?  ? L-DEX LYMPHEDEMA SCREENING  ? Measurement Type Unilateral   ? L-DEX MEASUREMENT EXTREMITY Upper Extremity   ? POSITION  Standing   ? DOMINANT SIDE Right   ? At Risk Side Left   ? BASELINE SCORE (UNILATERAL) -3.5   ? ?  ?  ? ?  ? ?TODAY'S TREATMENT  ?Performed with pt permission supine: clavicular and sternal nodes, bil axillary nodes, left inguinal nodes, anterior interaxillary pathway, left axillo-inguinal pathway, and then left breast towards the nearest nodes/pathway, and then repeating all steps in reverse.   ?Pt had been doing a lymphatic drainage video on youtube that is approved by this PT so she  was educated on how to add the breast portion to this video.  ?Made small foam chip pack for use on fibrosis with instruction foruse ? ?PATIENT EDUCATION:  ?Education details: breast MLD, POC ?Person educated: Patient ?Education method: Explanation, Demonstration, Tactile cues, and Verbal cues ?Education comprehension: verbalized understanding and returned demonstration ? ? ?HOME EXERCISE PROGRAM: ?Self MLD Lt breast ?Use of  foam chip pack for fibrosis ? ?ASSESSMENT: ? ?CLINICAL IMPRESSION: ?Patient is a 41 y.o. female known to this clinic who was seen today for physical therapy evaluation and treatment for worsening left breast edema and fibrosis now 3 months post radiation. Pt did have some improvements with a recent antibiotic but is also experiencing feelings of edema lateral trunk, inferior to the breast, and into the scapula.  Pt has no signs of UE lymphedema per [facility].  Pt is currently doing PT for orthopedic reasons so we discussed how we could combine or she could do 1 of each for now.  Discussed POC and how we will focus on decreasing breast edema and discomfort as well as tightness in the left upper quadrant.  Pt has tightness evident in the left latissimus, subscap, and pectoralis.  Due to distance from the clinic we will do 1x per week for 6 weeks.   ? ? ?OBJECTIVE IMPAIRMENTS decreased activity tolerance, decreased knowledge of use of DME, decreased ROM, and increased edema.  ? ?ACTIVITY LIMITATIONS community activity and occupation.  ? ?PERSONAL FACTORS Age, Behavior pattern, Past/current experiences, and 1-2 comorbidities: radiation and SLNB  are also affecting patient's functional outcome.  ? ? ?REHAB POTENTIAL: Excellent ? ?CLINICAL DECISION MAKING: Stable/uncomplicated ? ?EVALUATION COMPLEXITY: Low ? ?GOALS: ?Goals reviewed with patient? Yes ? ? ?LONG TERM GOALS: Target date: 07/15/2021  (Remove Blue Hyperlink) ? ?Pt will decrease feelings of left upper quadrant edema and tightness by at least  50% ?Baseline:  ?Goal status: INITIAL ? ?2.  Pt will be ind with self MLD or use of compression pump ?Baseline:  ?Goal status: INITIAL ? ?3.  Pt will be scheduled for continued [facility] surveillance ?Baseline:  ?Goal status: INIT

## 2021-06-03 NOTE — Therapy (Deleted)
[facility] ?[facility] ?[place], [place], [address] ?Phone: [phone]   Fax:  [phone] ? ?Physical Therapy Treatment ? ?Patient Details  ?Name: Gabriela Evans ?MRN: [id number] ?Date of Birth: 02/17/1980 ?Referring Provider (PT): Dr. Autumn Messing ? ? ?Encounter Date: 06/03/2021 ? ? ? ? ?Past Medical History:  ?Diagnosis Date  ? Anemia   ? Breast cancer ([place])   ? Complication of anesthesia   ? nausea  ? Gestational diabetes   ? Kidney infection   ? Other abnormal glucose   ? Gestational diabetes with both children  ? Personal history of radiation therapy   ? Pneumonia   ? 01-2019  ? PONV (postoperative nausea and vomiting)   ? ? ?Past Surgical History:  ?Procedure Laterality Date  ? BREAST LUMPECTOMY    ? BREAST LUMPECTOMY WITH RADIOACTIVE SEED AND SENTINEL LYMPH NODE BIOPSY Left 11/29/2020  ? Procedure: LEFT BREAST LUMPECTOMY WITH RADIOACTIVE SEED AND SENTINEL LYMPH NODE BIOPSY;  Surgeon: Jovita Kussmaul, MD;  Location: [facility];  Service: General;  Laterality: Left;  ? CESAREAN SECTION    ? X2  ? IUD inserted in OR    ? PELVIC LAPAROSCOPY  2009  ? remove perforated IUD  ? ROBOTIC ASSISTED LAPAROSCOPIC LYSIS OF ADHESION N/A 04/26/2019  ? Procedure: XI ROBOTIC ASSISTED LAPAROSCOPIC LYSIS OF ADHESION;  Surgeon: [name], MD;  Location: [facility];  Service: Gynecology;  Laterality: N/A;  ? ROBOTIC ASSISTED TOTAL HYSTERECTOMY WITH BILATERAL SALPINGO OOPHERECTOMY Bilateral 04/26/2019  ? Procedure: XI ROBOTIC ASSISTED TOTAL LAPAROSCOPIC HYSTERECTOMY WITH BILATERAL SALPINGECTOMY;  Surgeon: [name], MD;  Location: [facility];  Service: Gynecology;  Laterality: Bilateral;  requests 8:30 OR start in [facility] block time ?requests 2 hours  ? WISDOM TOOTH EXTRACTION    ? ? ?There were no vitals filed for this visit. ? ? ? ? ? ? ? ? ? ? ? ? ? ? ? ? ? ? ? ? ? ? ? ? ? ? ? ? ? ? ? ? ? ? ? ? ? ? ? PT  Long Term Goals - 10/30/20 1715   ? ?  ? PT LONG TERM GOAL #1  ? Title Patient will demonstrate she has regainde full shoulder ROM and function post operatively compared to baselines.   ? Time 8   ? Period Weeks   ? Status New   ? Target Date 12/25/20   ? ?  ?  ? ?  ? ? ? ? ? ? ? ? ? ? ?Patient will benefit from skilled therapeutic intervention in order to improve the following deficits and impairments:    ? ?Visit Diagnosis: ?Malignant neoplasm of upper-outer quadrant of left breast in female, estrogen receptor positive ([place]) ? ?Abnormal posture ? ?Cervicalgia ? ?Localized edema ? ? ? ? ?Problem List ?Patient Active Problem List  ? Diagnosis Date Noted  ? Genetic testing 11/07/2020  ? Malignant neoplasm of upper-outer quadrant of left breast in female, estrogen receptor positive ([place]) 10/28/2020  ? Postoperative state 04/26/2019  ? Post-operative state 04/26/2019  ? Menorrhagia 03/21/2018  ? ? ?Stark Bray, PT ?06/03/2021, 9:53 AM ? ?[facility] ?[place], [place], [address] ?Phone: [phone]   Fax:  [phone] ? ?Name: [name] ?MRN: [id number] ?Date of Birth: [birth year] ? ? ? ?

## 2021-06-05 NOTE — Progress Notes (Signed)
SURVIVORSHIP VISIT:  BRIEF ONCOLOGIC HISTORY:  Oncology History  Malignant neoplasm of upper-outer quadrant of left breast in female, estrogen receptor positive ([place])  10/28/2020 Initial Diagnosis   Presented with new focal thickening and pain her outer left breast. Diagnostic mammogram and [place]: indeterminate mass in the left breast at 3:00. 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  Breast MRI: 1 prominent lymph node and 9 mm mass     11/06/2020 Genetic Testing   Negative hereditary cancer genetic testing: no pathogenic variants detected in [facility] BRCAPlus Panel and [facility] CancerNext-Expanded +RNAinsight Panel. The report dates are November 06, 2020 and November 10, 2020, respectively.   The BRCAplus panel offered by [employer] and includes sequencing and deletion/duplication analysis for the following 8 genes: ATM, BRCA1, BRCA2, CDH1, CHEK2, PALB2, PTEN, and TP53.  The CancerNext-Expanded gene panel offered by [facility] and includes sequencing, rearrangement, and RNA analysis for the following 77 genes: AIP, ALK, APC, ATM, AXIN2, BAP1, BARD1, BLM, BMPR1A, BRCA1, BRCA2, BRIP1, CDC73, CDH1, CDK4, CDKN1B, CDKN2A, CHEK2, CTNNA1, DICER1, FANCC, FH, FLCN, GALNT12, KIF1B, LZTR1, MAX, MEN1, MET, MLH1, MSH2, MSH3, MSH6, MUTYH, NBN, NF1, NF2, NTHL1, PALB2, PHOX2B, PMS2, POT1, PRKAR1A, PTCH1, PTEN, RAD51C, RAD51D, RB1, RECQL, RET, SDHA, SDHAF2, SDHB, SDHC, SDHD, SMAD4, SMARCA4, SMARCB1, SMARCE1, STK11, SUFU, TMEM127, TP53, TSC1, TSC2, VHL and XRCC2 (sequencing and deletion/duplication); EGFR, EGLN1, HOXB13, KIT, MITF, PDGFRA, POLD1, and POLE (sequencing only); EPCAM and GREM1 (deletion/duplication only).    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 Mccubbins to review her survivorship care plan detailing her treatment course for breast cancer, as well as monitoring long-term side effects of that treatment, education regarding health maintenance, screening, and overall wellness and health promotion.     Overall, Ms. Labrador reports feeling moderately well.  She is struggling with tamoxifen and how she feels on it.  She also notes some left breast pain and soreness.    REVIEW OF SYSTEMS:  Review of Systems  Constitutional:  Negative for appetite change, chills, fatigue, fever and unexpected weight change.  HENT:   Negative for hearing loss, lump/mass and trouble swallowing.   Eyes:  Negative for eye problems and icterus.  Respiratory:  Negative for chest tightness, cough and shortness of breath.   Cardiovascular:  Negative for chest pain, leg swelling and palpitations.  Gastrointestinal:  Negative for abdominal distention, abdominal pain, constipation, diarrhea, nausea and vomiting.  Endocrine: Negative for hot flashes.  Genitourinary:  Negative for difficulty urinating.   Musculoskeletal:  Negative for arthralgias.  Skin:  Negative for itching and rash.  Neurological:  Negative for dizziness, extremity weakness, headaches and numbness.  Hematological:  Negative for adenopathy. Does not bruise/bleed easily.  Psychiatric/Behavioral:  Negative for depression. The patient is not nervous/anxious.   Breast: Denies any new nodularity, masses, tenderness, nipple changes, or nipple discharge.    ONCOLOGY TREATMENT TEAM:  1. Surgeon:  Dr. Marlou Starks at [facility] 2. Medical Oncologist: Dr. Lindi Adie  3.  Radiation Oncologist: Dr. Lisbeth Renshaw    PAST MEDICAL/SURGICAL HISTORY:  Past Medical History:  Diagnosis Date   Anemia    Breast cancer ([place])    Complication of anesthesia    nausea   Gestational diabetes    Kidney infection    Other abnormal glucose    Gestational diabetes with both children   Personal history of radiation therapy    Pneumonia    01-2019   PONV (postoperative nausea and vomiting)    Past Surgical History:  Procedure Laterality Date   BREAST LUMPECTOMY     BREAST LUMPECTOMY WITH RADIOACTIVE SEED AND SENTINEL LYMPH NODE BIOPSY Left 11/29/2020   Procedure: LEFT BREAST LUMPECTOMY WITH RADIOACTIVE SEED AND SENTINEL LYMPH NODE BIOPSY;  Surgeon: Jovita Kussmaul, MD;  Location: [place];  Service: General;  Laterality: Left;   CESAREAN SECTION     X2   IUD inserted in [place]  2009   remove perforated IUD   ROBOTIC ASSISTED LAPAROSCOPIC LYSIS OF ADHESION N/A 04/26/2019   Procedure: XI ROBOTIC ASSISTED LAPAROSCOPIC LYSIS OF ADHESION;  Surgeon: [name], MD;  Location: [place];  Service: Gynecology;  Laterality: N/A;   ROBOTIC ASSISTED TOTAL HYSTERECTOMY WITH BILATERAL SALPINGO OOPHERECTOMY Bilateral 04/26/2019   Procedure: XI ROBOTIC ASSISTED TOTAL LAPAROSCOPIC HYSTERECTOMY WITH BILATERAL SALPINGECTOMY;  Surgeon: [name], MD;  Location: [place];  Service: Gynecology;  Laterality: Bilateral;  requests 8:30 OR start in [place] block time requests 2 hours   WISDOM TOOTH EXTRACTION       ALLERGIES:  Allergies  Allergen Reactions   Nickel Swelling   Other     Sensitive to gluten and dairy   Tramadol Nausea Only and Swelling   Oxycodone-Acetaminophen Itching   Tamoxifen Itching and Rash     CURRENT MEDICATIONS:  Outpatient Encounter Medications as of 06/03/2021  Medication Sig Note   amoxicillin-clavulanate (AUGMENTIN) 875-125 MG tablet Take 1 tablet by mouth 2 (two)  times daily.    BLACK ELDERBERRY,BERRY-FLOWER, PO Take 1 capsule by mouth daily.    gabapentin (NEURONTIN) 100 MG capsule Take 100 mg by mouth 3 (three) times daily.    Medium Chain Triglycerides (MCT OIL PO) Take by mouth daily.    Multiple Vitamins-Minerals (MULTIVITAMIN WITH MINERALS) tablet Take 2 tablets by mouth daily.    Probiotic Product (PROBIOTIC PO) Take 1 tablet by mouth daily. 10/16/2019: prn   vitamin C (ASCORBIC ACID) 250 MG tablet Take 250 mg by mouth daily. 10/16/2019: prn   vitamin E 1000 UNIT capsule Take 1,000 Units by mouth daily.    acetaminophen (TYLENOL) 500 MG tablet Take 500 mg by mouth every 6 (six) hours as needed. (Patient not taking: Reported on 06/03/2021)    Charcoal Activated (ACTIVATED CHARCOAL) POWD by Does not apply route as needed. (Patient not taking: Reported on 06/03/2021)    ibuprofen (ADVIL) 400 MG tablet Take 400 mg by mouth every 6 (six) hours as needed. (Patient not taking: Reported on 06/03/2021)    Theanine 200 MG CAPS Take by mouth. (Patient not taking: Reported on 06/03/2021)    [DISCONTINUED] ferrous sulfate 325 (65 FE) MG tablet Take 325 mg by mouth daily with breakfast.    [DISCONTINUED] magic mouthwash (lidocaine, diphenhydrAMINE, alum & mag hydroxide) suspension Swish and swallow 5 mls by mouth 3 times a day as needed    [DISCONTINUED] tamoxifen (NOLVADEX)  20 MG tablet Take 1 tablet (20 mg total) by mouth daily.    No facility-administered encounter medications on file as of 06/03/2021.     ONCOLOGIC FAMILY HISTORY:  Family History  Problem Relation Age of Onset   Diabetes Mother    Thyroid disease Father    Diabetes Father    Thyroid disease Sister    Thyroid cancer Maternal Aunt        papillary; dx 79s   Brain cancer Maternal Uncle 2       gliolblastoma   Rectal cancer Paternal Uncle        mets; dx 62s   Cancer Paternal Uncle        unknown type; dx 47s   Thyroid cancer Maternal Grandmother        follicular   Prostate cancer  Maternal Grandfather        dx 49s   Melanoma Paternal Grandmother        mets; dx 45s     SOCIAL HISTORY:  Social History   Socioeconomic History   Marital status: Married    Spouse name: Not on file   Number of children: Not on file   Years of education: Not on file   Highest education level: Not on file  Occupational History   Not on file  Tobacco Use   Smoking status: Never   Smokeless tobacco: Never  Vaping Use   Vaping Use: Never used  Substance and Sexual Activity   Alcohol use: Yes    Comment: occas   Drug use: No   Sexual activity: Yes    Birth control/protection: Surgical    Comment: Hyst-INTERCOURSE AGE 67, LESS THA N 5 SEXUAL PARTNERS  Other Topics Concern   Not on file  Social History Narrative   Not on file   Social Determinants of Health   Financial Resource Strain: Not on file  Food Insecurity: Not on file  Transportation Needs: Not on file  Physical Activity: Not on file  Stress: Not on file  Social Connections: Not on file  Intimate Partner Violence: Not on file     OBSERVATIONS/OBJECTIVE:  BP (!) 126/91 (BP Location: Right Arm, Patient Position: Sitting)   Pulse 65   Temp 98 F (36.7 C) (Tympanic)   Resp 18   Ht $R'5\' 9"'Gn$  (1.753 m)   Wt 214 lb 11.2 oz (97.4 kg)   LMP 04/18/2019   SpO2 100%   BMI 31.71 kg/m  GENERAL: Patient is a well appearing female in no acute distress HEENT:  Sclerae anicteric.  Oropharynx clear and moist. No ulcerations or evidence of oropharyngeal candidiasis. Neck is supple.  NODES:  No cervical, supraclavicular, or axillary lymphadenopathy palpated.  BREAST EXAM:  left breast s/p lumpectomy and radiation, fullness noted, right breast benign LUNGS:  Clear to auscultation bilaterally.  No wheezes or rhonchi. HEART:  Regular rate and rhythm. No murmur appreciated. ABDOMEN:  Soft, nontender.  Positive, normoactive bowel sounds. No organomegaly palpated. MSK:  No focal spinal tenderness to palpation. Full range of  motion bilaterally in the upper extremities. EXTREMITIES:  No peripheral edema.   SKIN:  Clear with no obvious rashes or skin changes. No nail dyscrasia. NEURO:  Nonfocal. Well oriented.  Appropriate affect.   LABORATORY DATA:  None for this visit.  DIAGNOSTIC IMAGING:  None for this visit.      ASSESSMENT AND PLAN:  Ms.. Evans is a pleasant 41 y.o. female with Stage IA left breast invasive ductal carcinoma, ER+/PR+/HER2-, diagnosed  in 10/2020, treated with lumpectomy, adjuvant radiation therapy, and anti-estrogen therapy with tamoxifen beginning in 03/2021 (currently on hold).  She presents to the Survivorship Clinic for our initial meeting and routine follow-up post-completion of treatment for breast cancer.    1. Stage IA left breast cancer:  Ms. Cogan is continuing to recover from definitive treatment for breast cancer. She will follow-up with me in 4-5 weeks to discuss how she is feeling off tamoxifen.  We discussed her other options for antiestrogen therapy and risks without antiestrogen therapy at all.  We also discussed intensified screening and due to her age, breast density, and family history she will undergo breast MRI in 09/2021 followed by her annual mammogram which is due in 03/2022.   Today, a comprehensive survivorship care plan and treatment summary was reviewed with the patient today detailing her breast cancer diagnosis, treatment course, potential late/long-term effects of treatment, appropriate follow-up care with recommendations for the future, and patient education resources.  A copy of this summary, along with a letter will be sent to the patient's primary care provider via mail/fax/In Basket message after today's visit.    2. Bone health:  She was given education on specific activities to promote bone health.  3. Cancer screening:  Due to Ms. Difranco's history and her age, she should receive screening for skin cancers, colon cancer (when due), and gynecologic cancers.   The information and recommendations are listed on the patient's comprehensive care plan/treatment summary and were reviewed in detail with the patient.    4. Health maintenance and wellness promotion: Ms. Kott was encouraged to consume 5-7 servings of fruits and vegetables per day. We reviewed the "Nutrition Rainbow" handout.  She was also encouraged to engage in moderate to vigorous exercise for 30 minutes per day most days of the week. We discussed the [employer] fitness program, which is designed for cancer survivors to help them become more physically fit after cancer treatments.  She was instructed to limit her alcohol consumption and continue to abstain from tobacco use.     5. Support services/counseling: It is not uncommon for this period of the patient's cancer care trajectory to be one of many emotions and stressors. She was given information regarding our available services and encouraged to contact me with any questions or for help enrolling in any of our support group/programs.    Follow up instructions:    -Return to cancer center in 4-6 weeks for f/u  -Mammogram due in 03/2022 -Breast MRI in 09/2021 -Follow up with surgery one year -She is welcome to return back to the Survivorship Clinic at any time; no additional follow-up needed at this time.  -Consider referral back to survivorship as a long-term survivor for continued surveillance  The patient was provided an opportunity to ask questions and all were answered. The patient agreed with the plan and demonstrated an understanding of the instructions.   Total encounter time:45 minutes*in face-to-face visit time, chart review, lab review, care coordination, order entry, and documentation of the encounter time.    Wilber Bihari, NP 06/08/21 8:25 AM Medical Oncology and Hematology [facility] [place],  [address] Tel. [phone]    Fax. [phone]  *Total Encounter Time as defined  by the Centers for Medicare and Medicaid Services includes, in addition to the face-to-face time of a patient visit (documented in the note above) non-face-to-face time: obtaining and reviewing outside history, ordering and reviewing medications, tests or procedures, care coordination (communications  with other health care professionals or caregivers) and documentation in the medical record.

## 2021-06-08 ENCOUNTER — Encounter: Payer: Self-pay | Admitting: Adult Health

## 2021-06-09 ENCOUNTER — Ambulatory Visit: Payer: Managed Care, Other (non HMO) | Admitting: Obstetrics & Gynecology

## 2021-06-09 ENCOUNTER — Ambulatory Visit
Admission: RE | Admit: 2021-06-09 | Discharge: 2021-06-09 | Disposition: A | Discharge status: Home / Self Care | Source: Ambulatory Visit | Attending: Hematology and Oncology | Admitting: Hematology and Oncology

## 2021-06-09 DIAGNOSIS — Z170 Estrogen receptor positive status [ER+]: Secondary | ICD-10-CM

## 2021-06-09 IMAGING — US US BREAST*L* LIMITED INC AXILLA
1 series · 2 of 2 positions shown · non-contrast
Comparison: Previous exams

CLINICAL DATA: 41-year-old female presenting for evaluation of new
left breast swelling and erythema. Patient has a history of left
breast cancer in [DATE] status post lumpectomy. She has
undergone radiation of the left breast. She first noticed the
swelling and erythema proximally two weeks ago and was placed on
antibiotics by her physician. She has now finished a course of
antibiotics with improvement in symptoms.

EXAM:
ULTRASOUND OF THE LEFT BREAST

[Series 1: us breast*left* limited inc axilla · 0.06mm/px · 2 of 2 slices shown]
[im 1/2]
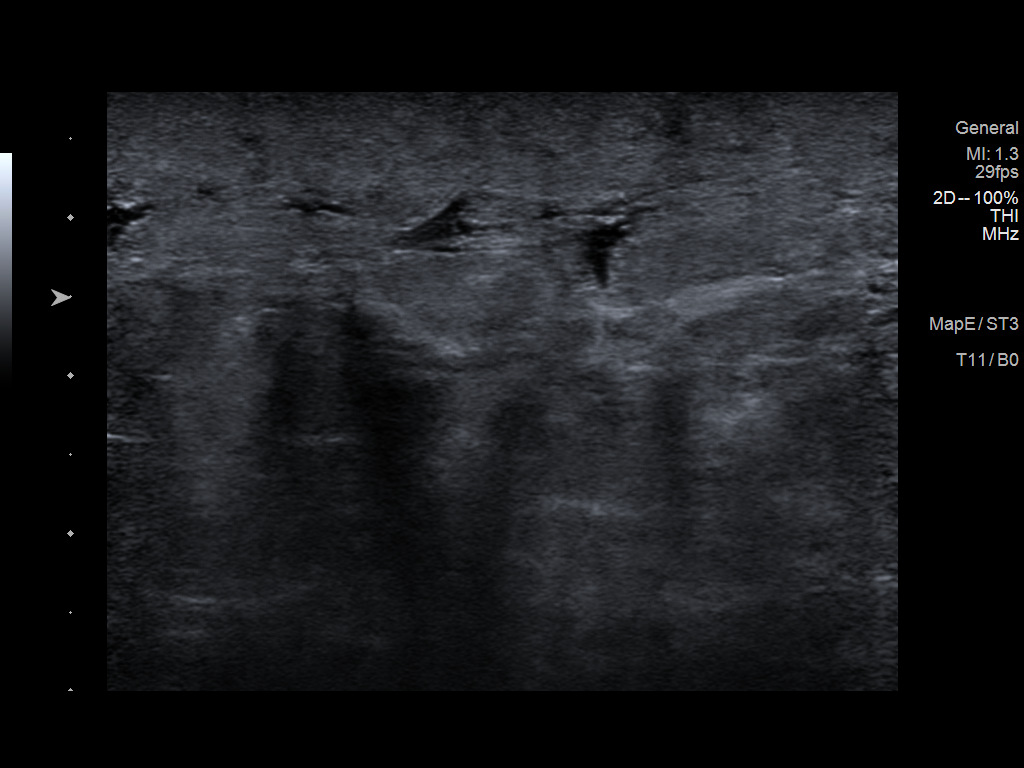
[im 2/2]
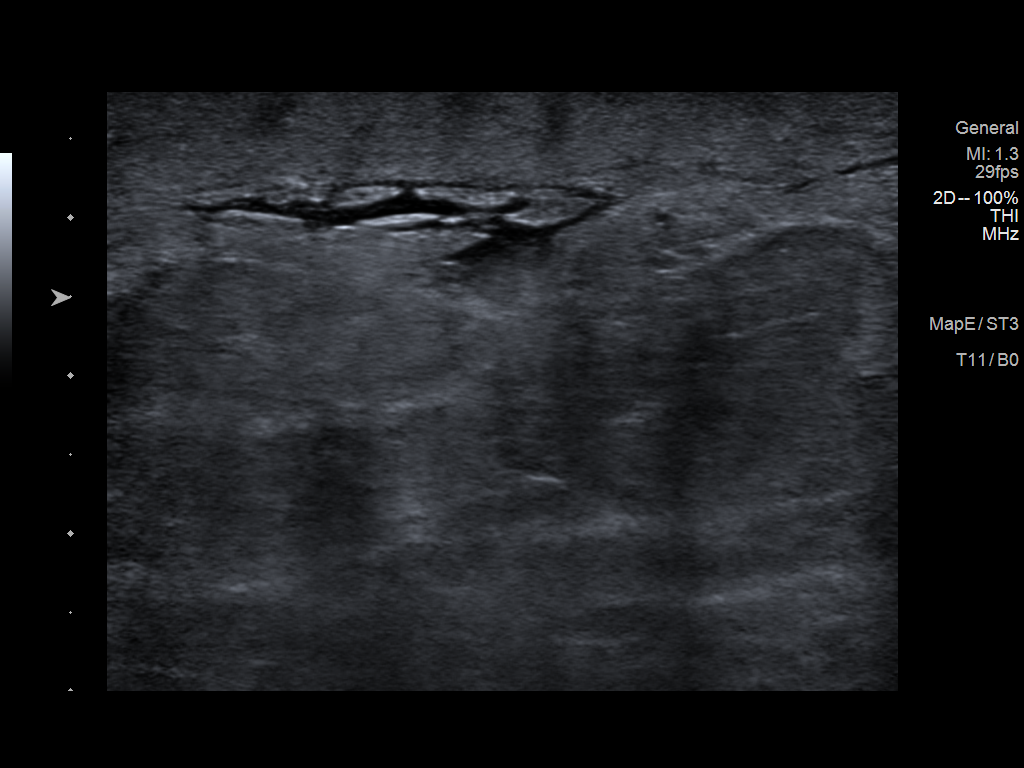

[2 of 2 positions shown; findings below may reference images not displayed]

FINDINGS: On physical exam, there is subtle thickening in the lower inner left
breast likely related to radiation change. There is no fixed
discrete mass. Slight diffuse erythema. No skin lesion or breakdown.

Targeted ultrasound is performed at the site of concern reported by
the patient throughout the lower inner left breast demonstrating
skin thickening and mild fluid tracking through the skin, consistent
with edema. There is no significant collection or sub adjacent mass.
IMPRESSION: At the site of concern reported by the patient in the left breast
there is skin thickening and mild edema, likely related to recent
radiation. No drainable collection or suspicious mass.

RECOMMENDATION:
1.  Clinical follow-up for the left breast swelling.

2. Return for annual diagnostic mammogram which will be due in
[DATE].

I have discussed the findings and recommendations with the patient.
If applicable, a reminder letter will be sent to the patient
regarding the next appointment.

BI-RADS CATEGORY  2: Benign.

## 2021-06-11 ENCOUNTER — Other Ambulatory Visit

## 2021-06-11 ENCOUNTER — Other Ambulatory Visit: Payer: Self-pay | Admitting: *Deleted

## 2021-06-11 MED ORDER — LEVOFLOXACIN 750 MG PO TABS
750.0000 mg | ORAL_TABLET | Freq: Every day | ORAL | 0 refills | Status: DC
Start: 2021-06-11 — End: 2021-06-18

## 2021-06-11 NOTE — Progress Notes (Signed)
Verbal orders received from MD for pt to be prescribed Levaquin 750 mg tablet p.o daily x7 days for left breast mastitis and return to the office in 1 week for evaluation. Prescription sent to pharmacy on file, pt educated and verbalized understanding.

## 2021-06-12 ENCOUNTER — Telehealth: Payer: Self-pay | Admitting: *Deleted

## 2021-06-12 NOTE — Telephone Encounter (Signed)
06/11/2021 [email or web address] E-mail : Hi team.  Our social worker Gwinda Maine received a voicemail from [place] [phone] regarding pt Annali Lybrand [birth year] and her disability paperwork if you can please reach out to them. thanks   Reached out to [facility] via phone no.# [phone]) per disability paperwork received.   Return call received from customer service representative, [place].  This nurse provided patient name, , D.O.B., S.S.N., Claim no.#: [id number] and, provider name.  Reports "[employer] called to confirm receipt of fax sent 06/04/2021 and status of return." Advised 06/05/2021 receipt.  Our office asks 7 - 10 business days from our receipt to process forms.   Currently no further questions or needs.

## 2021-06-17 ENCOUNTER — Telehealth: Payer: Self-pay | Admitting: *Deleted

## 2021-06-17 NOTE — Telephone Encounter (Signed)
Disability clarification letter completed today for provider review and signature.  Successfully faxed to [employer] at this time.  Original copy alphabetical file folder behind appointment registration area one for patient pick up.  No further completed form delivery actions performed or further instructions received.

## 2021-06-18 ENCOUNTER — Inpatient Hospital Stay (HOSPITAL_BASED_OUTPATIENT_CLINIC_OR_DEPARTMENT_OTHER): Admitting: Hematology and Oncology

## 2021-06-18 ENCOUNTER — Other Ambulatory Visit: Payer: Self-pay

## 2021-06-18 DIAGNOSIS — C50412 Malignant neoplasm of upper-outer quadrant of left female breast: Secondary | ICD-10-CM

## 2021-06-18 DIAGNOSIS — Z170 Estrogen receptor positive status [ER+]: Secondary | ICD-10-CM | POA: Diagnosis not present

## 2021-06-18 MED ORDER — LEVOFLOXACIN 750 MG PO TABS: 750.0000 mg | ORAL_TABLET | Freq: Every day | ORAL | 0 refills | Status: DC

## 2021-06-18 NOTE — Assessment & Plan Note (Addendum)
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 Oncotype DX: Low risk no role of chemo 2. Adjuvant radiation therapy completed 03/03/2021 3. Adjuvant antiestrogen therapystarted 03/19/2021 -------------------------------------------------------------------------------------------------------------- Tamoxifen toxicities:Allergy reaction with a rash on the dorsum of her hands and wrists, discontinued 05/03/2021    Left breast swelling with heat and slight redness: Ultrasound: Negative for any fluid collection or suspicious masses.

## 2021-06-18 NOTE — Progress Notes (Signed)
Patient Care Team: Patient, [name] as PCP - General (General Practice) Jovita Kussmaul, MD as Consulting Physician (General Surgery) Nicholas Lose, MD as Consulting Physician (Hematology and Oncology) Kyung Rudd, MD as Consulting Physician (Radiation Oncology)  DIAGNOSIS:  Encounter Diagnosis  Name Primary?   Malignant neoplasm of upper-outer quadrant of left breast in female, estrogen receptor positive ([place])     SUMMARY OF ONCOLOGIC HISTORY: Oncology History  Malignant neoplasm of upper-outer quadrant of left breast in female, estrogen receptor positive ([place])  10/28/2020 Initial Diagnosis   Presented with new focal thickening and pain her outer left breast. Diagnostic mammogram and [place]: indeterminate mass in the left breast at 3:00. 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  Breast MRI: 1 prominent lymph node and 9 mm mass     11/06/2020 Genetic Testing   Negative hereditary cancer genetic testing: no pathogenic variants detected in [facility] BRCAPlus Panel and [facility] CancerNext-Expanded +RNAinsight Panel. The report dates are November 06, 2020 and November 10, 2020, respectively.   The BRCAplus panel offered by [employer] and includes sequencing and deletion/duplication analysis for the following 8 genes: ATM, BRCA1, BRCA2, CDH1, CHEK2, PALB2, PTEN, and TP53.  The CancerNext-Expanded gene panel offered by [facility] and includes sequencing, rearrangement, and RNA analysis for the following 77 genes: AIP, ALK, APC, ATM, AXIN2, BAP1, BARD1, BLM, BMPR1A, BRCA1, BRCA2, BRIP1, CDC73, CDH1, CDK4, CDKN1B, CDKN2A, CHEK2, CTNNA1, DICER1, FANCC, FH, FLCN, GALNT12, KIF1B, LZTR1, MAX, MEN1, MET, MLH1, MSH2, MSH3, MSH6, MUTYH, NBN, NF1, NF2, NTHL1, PALB2, PHOX2B, PMS2, POT1, PRKAR1A, PTCH1, PTEN, RAD51C, RAD51D, RB1, RECQL, RET, SDHA, SDHAF2, SDHB, SDHC, SDHD, SMAD4, SMARCA4, SMARCB1, SMARCE1, STK11, SUFU, TMEM127, TP53,  TSC1, TSC2, VHL and XRCC2 (sequencing and deletion/duplication); EGFR, EGLN1, HOXB13, KIT, MITF, PDGFRA, POLD1, and POLE (sequencing only); EPCAM and GREM1 (deletion/duplication only).    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, margins negative, ER 95%, PR 100%, HER2 negative, Ki-67 10%    12/11/2020 Oncotype testing   Oncotype score: 20, distant recurrence at 9 years: 6%   01/14/2021 - 03/03/2021 Radiation Therapy     Site Technique Total Dose (Gy) Dose per Fx (Gy) Completed Fx Beam Energies  Breast, Left: Breast_L 3D 50.4/50.4 1.8 28/28 6X  Breast, Left: Breast_L_Bst 3D 10/10 2 5/5 6X     03/2021 -  Anti-estrogen oral therapy   Tamoxifen daily     CHIEF COMPLIANT: Continued problems with redness and warmth in the left breast  INTERVAL HISTORY: Gabriela Evans is a 41 year old above-mentioned history of redness pain and warmth of the left breast for which she has been on 2 different antibiotic regimens and is here today to discuss what to do next.  She tells me that the redness has improved but the warmth and the tenderness has not improved.  She does not have pain constantly but whenever somebody presses on the breast that the pain is quite severe.  But it last for short time and then goes away.  She is unsure if it is related to infection or if it is related to postradiation changes.  Since she has been on Levaquin for the past 7 days she no longer has any fevers.   ALLERGIES:  is allergic to nickel, other, tramadol, oxycodone-acetaminophen, and tamoxifen.  MEDICATIONS:  Current Outpatient Medications  Medication Sig Dispense Refill   acetaminophen (TYLENOL) 500 MG tablet Take 500 mg by mouth  every 6 (six) hours as needed. (Patient not taking: Reported on 06/03/2021)     BLACK ELDERBERRY,BERRY-FLOWER, PO Take 1 capsule by mouth daily.     Charcoal Activated (ACTIVATED CHARCOAL) POWD by Does not apply route as  needed. (Patient not taking: Reported on 06/03/2021)     gabapentin (NEURONTIN) 100 MG capsule Take 100 mg by mouth 3 (three) times daily.     ibuprofen (ADVIL) 400 MG tablet Take 400 mg by mouth every 6 (six) hours as needed. (Patient not taking: Reported on 06/03/2021)     levofloxacin (LEVAQUIN) 750 MG tablet Take 1 tablet (750 mg total) by mouth daily. 7 tablet 0   Medium Chain Triglycerides (MCT OIL PO) Take by mouth daily.     Multiple Vitamins-Minerals (MULTIVITAMIN WITH MINERALS) tablet Take 2 tablets by mouth daily.     Probiotic Product (PROBIOTIC PO) Take 1 tablet by mouth daily.     Theanine 200 MG CAPS Take by mouth. (Patient not taking: Reported on 06/03/2021)     vitamin C (ASCORBIC ACID) 250 MG tablet Take 250 mg by mouth daily.     vitamin E 1000 UNIT capsule Take 1,000 Units by mouth daily.     No current facility-administered medications for this visit.    PHYSICAL EXAMINATION: ECOG PERFORMANCE STATUS: 1 - Symptomatic but completely ambulatory  Vitals:   06/18/21 1213  BP: 124/81  Pulse: 65  Resp: 18  Temp: (!) 97.3 F (36.3 C)  SpO2: 100%   Filed Weights   06/18/21 1213  Weight: 214 lb 14.4 oz (97.5 kg)    BREAST: Significant warmth and redness and tenderness of the left breast.  The redness has improved from compared to before.. (exam performed in the presence of a chaperone)  LABORATORY DATA:  I have reviewed the data as listed    Latest Ref Rng & Units 02/18/2021    2:22 PM 10/30/2020   12:11 PM 10/08/2020   12:29 PM  CMP  Glucose 70 - 99 mg/dL 89   95   116    BUN 6 - 20 mg/dL _0 Creatinine 0.44 - 1.00 mg/dL 0.70   0.67   0.72    Sodium 135 - 145 mmol/L 139   142   139    Potassium 3.5 - 5.1 mmol/L 3.6   4.0   3.8    Chloride 98 - 111 mmol/L 105   110   105    CO2 22 - 32 mmol/L _1 Calcium 8.9 - 10.3 mg/dL 9.4   9.8   9.5    Total Protein 6.5 - 8.1 g/dL 7.2   7.6   6.8    Total Bilirubin 0.3 - 1.2 mg/dL 0.3   0.4   0.3     Alkaline Phos 38 - 126 U/L 47   46     AST 15 - 41 U/L _2 ALT 0 - 44 U/L _3 Lab Results  Component Value Date   WBC 5.8 02/18/2021   HGB 12.3 02/18/2021   HCT 36.2 02/18/2021   MCV 88.5 02/18/2021   PLT 262 02/18/2021   NEUTROABS 3.7 02/18/2021    ASSESSMENT & PLAN:  Malignant neoplasm of upper-outer quadrant of left breast in female, estrogen receptor positive ([place]) 11/29/2020: Left lumpectomy: Mucinous carcinoma  1.2 cm, 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 Oncotype DX: Low risk no role of chemo 2. Adjuvant radiation therapy completed 03/03/2021 3. Adjuvant antiestrogen therapy started 03/19/2021 -------------------------------------------------------------------------------------------------------------- Tamoxifen toxicities: Allergy reaction with a rash on the dorsum of her hands and wrists, discontinued 05/03/2021   She does not wish to take any other antiestrogen therapies.   Left breast swelling with heat and slight redness: Ultrasound: Negative for any fluid collection or suspicious masses. We will send another 1 week of Levaquin.  It appears that the redness has improved on levofloxacin.  I we will request patient to see surgery for another evaluation.  I suspect that if it does not improve any further than it is purely radiation related and that we will just watch and monitor.   No orders of the defined types were placed in this encounter.  The patient has a good understanding of the overall plan. she agrees with it. she will call with any problems that may develop before the next visit here. Total time spent: 30 mins including face to face time and time spent for planning, charting and co-ordination of care   Harriette Ohara, MD 06/18/21

## 2021-06-24 ENCOUNTER — Ambulatory Visit: Attending: Hematology and Oncology | Admitting: Rehabilitation

## 2021-06-24 ENCOUNTER — Encounter: Payer: Self-pay | Admitting: Rehabilitation

## 2021-06-24 DIAGNOSIS — Z170 Estrogen receptor positive status [ER+]: Secondary | ICD-10-CM | POA: Diagnosis present

## 2021-06-24 DIAGNOSIS — C50412 Malignant neoplasm of upper-outer quadrant of left female breast: Secondary | ICD-10-CM | POA: Diagnosis present

## 2021-06-24 DIAGNOSIS — M542 Cervicalgia: Secondary | ICD-10-CM | POA: Insufficient documentation

## 2021-06-24 DIAGNOSIS — R600 Localized edema: Secondary | ICD-10-CM | POA: Insufficient documentation

## 2021-06-24 DIAGNOSIS — R293 Abnormal posture: Secondary | ICD-10-CM | POA: Diagnosis present

## 2021-06-24 NOTE — Therapy (Signed)
OUTPATIENT PHYSICAL THERAPY ONCOLOGY EVALUATION  Patient Name: Gabriela Evans MRN: [id number] DOB:1980/05/13, 41 y.o., female Today's Date: 06/24/2021   PT End of Session - 06/24/21 1001     Visit Number 2    Number of Visits 9    Date for PT Re-Evaluation 07/15/21    PT Start Time 1003    PT Stop Time 1051    PT Time Calculation (min) 48 min    Activity Tolerance Patient tolerated treatment well    Behavior During Therapy [facility] for tasks assessed/performed             Past Medical History:  Diagnosis Date   Anemia    Breast cancer ([place])    Complication of anesthesia    nausea   Gestational diabetes    Kidney infection    Other abnormal glucose    Gestational diabetes with both children   Personal history of radiation therapy    Pneumonia    01-2019   PONV (postoperative nausea and vomiting)    Past Surgical History:  Procedure Laterality Date   BREAST LUMPECTOMY     BREAST LUMPECTOMY WITH RADIOACTIVE SEED AND SENTINEL LYMPH NODE BIOPSY Left 11/29/2020   Procedure: LEFT BREAST LUMPECTOMY WITH RADIOACTIVE SEED AND SENTINEL LYMPH NODE BIOPSY;  Surgeon: Jovita Kussmaul, MD;  Location: [place];  Service: General;  Laterality: Left;   CESAREAN SECTION     X2   IUD inserted in [place]  2009   remove perforated IUD   ROBOTIC ASSISTED LAPAROSCOPIC LYSIS OF ADHESION N/A 04/26/2019   Procedure: XI ROBOTIC ASSISTED LAPAROSCOPIC LYSIS OF ADHESION;  Surgeon: [name], MD;  Location: [place];  Service: Gynecology;  Laterality: N/A;   ROBOTIC ASSISTED TOTAL HYSTERECTOMY WITH BILATERAL SALPINGO OOPHERECTOMY Bilateral 04/26/2019   Procedure: XI ROBOTIC ASSISTED TOTAL LAPAROSCOPIC HYSTERECTOMY WITH BILATERAL SALPINGECTOMY;  Surgeon: [name], MD;  Location: [place];  Service: Gynecology;  Laterality: Bilateral;  requests 8:30 OR start in [place] block time requests 2 hours   WISDOM  TOOTH EXTRACTION     Patient Active Problem List   Diagnosis Date Noted   Genetic testing 11/07/2020   Malignant neoplasm of upper-outer quadrant of left breast in female, estrogen receptor positive ([place]) 10/28/2020   Postoperative state 04/26/2019   Post-operative state 04/26/2019   Menorrhagia 03/21/2018    PCP: None  REFERRING PROVIDER: Dr Lindi Adie  REFERRING DIAG: C50.412,Z17.0 (ICD-10-CM) - Malignant neoplasm of upper-outer quadrant of left breast in female, estrogen receptor positive ([place])  THERAPY DIAG:  Malignant neoplasm of upper-outer quadrant of left breast in female, estrogen receptor positive ([place])  Cervicalgia  Abnormal posture  Localized edema  ONSET DATE: 11/29/20  SUBJECTIVE  SUBJECTIVE STATEMENT:  I just started my 3rd round of antibiotics.  I am feeling just so swollen and sore.  They aren't sure if it is infection or not. I am supposed to see the surgeon again.  The redness has gone down but they think it may just be from radiation.   PERTINENT HISTORY: Lt lumpectomy with 2 negative lymph nodes removed on 11/29/20.  She underwent a partial hysterectomy (ovaries still remain) in 04/2019. Radiation completed.   PAIN:  Are you having pain? No I feel tender to the touch in all the swollen places and my shoulder blade and breast 2/10  PRECAUTIONS: Left lymphedema precautions  WEIGHT BEARING RESTRICTIONS No  FALLS:  Has patient fallen in last 6 months? 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  LEISURE: limited due to left UE  HAND DOMINANCE : right   PRIOR LEVEL OF FUNCTION: Independent  PATIENT GOALS focusing on the breast size and  swelling   OBJECTIVE  COGNITION:  Overall cognitive status: Within functional limits for tasks assessed   PALPATION: Fibrosis mild under medial skin changes, moderate fibrosis lateral inferior breast more noticeable in sidelying, tenderness to latissimus with minimal skin excursion here  OBSERVATIONS / OTHER ASSESSMENTS: left breast with peau d'orange skin medial inferior breast, darker from radiation  UPPER EXTREMITY AROM/PROM:  A/PROM RIGHT  12/16/20   Shoulder extension   Shoulder flexion   Shoulder abduction   Shoulder internal rotation   Shoulder external rotation     (Blank rows = not tested)  A/PROM 10/30/20 LEFT 12/16/20   Shoulder extension 52 60   Shoulder flexion 149 155   Shoulder abduction 162 135   Shoulder internal rotation     Shoulder external rotation 87 90     (Blank rows = not tested)   CERVICAL AROM: All within normal limits:    12/16/20   Flexion 45 pull   Extension 50 tightness   Right lateral flexion    Left lateral flexion    Right rotation 60 tension Rt suboccipitals   Left rotation 45 tension Lt suboccipitals    L-DEX LYMPHEDEMA SCREENING: WNL  TODAY'S TREATMENT  Pt permission and consent throughout each step of examination and treatment with modification and draping if requested when working on sensitive areas  06/24/21 Pt continues with left breast fibrosis, soft edema, pitting edema, tenderness, and heat.   Performed with pt permission supine: clavicular and sternal nodes, bil axillary nodes, left inguinal nodes, anterior interaxillary pathway, left axillo-inguinal pathway, and then left breast towards the nearest nodes/pathway, and then repeating all steps in reverse. 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  Applied KT 5 strip with bucket at subscapular lymph nodes with strips applied with tape pressure only to left lateral breast.   Gave pt new script for breast swell spot and more bras Sent demo to  flexitouch  06/03/21 Performed with pt permission supine: clavicular and sternal nodes, bil axillary nodes, left inguinal nodes, anterior interaxillary pathway, left axillo-inguinal pathway, and then left breast towards the nearest nodes/pathway, and then repeating all steps in reverse.   Pt had been doing a lymphatic drainage video on youtube that is approved by this PT so she was educated on how to add the breast portion to this video.  Made small foam chip pack for use on fibrosis with instruction foruse  PATIENT  EDUCATION:  Education details: breast MLD, POC Person educated: Patient Education method: Explanation, Demonstration, Tactile cues, and Verbal cues Education comprehension: verbalized understanding and returned demonstration   HOME EXERCISE PROGRAM: Self MLD Lt breast Use of foam chip pack for fibrosis  ASSESSMENT:  CLINICAL IMPRESSION: Pt continues with left breast heat, intermittent redness, pain and tenderness and soft and pitting edema which is on its 3rd round of antibiotics and does not seem to change.  Discussed how lymphedema can contribute to recurrent cellulitis and can also have pain and mild redness associated.  Also discussed rebound radiation.  Dr. Lindi Adie said this would be the last round of antibiotics and that we can contribute it to radiation if no changes.  Continued with MLD with light touch and sent demo to [place].    OBJECTIVE IMPAIRMENTS decreased activity tolerance, decreased knowledge of use of DME, decreased ROM, and increased edema.   ACTIVITY LIMITATIONS community activity and occupation.   PERSONAL FACTORS Age, Behavior pattern, Past/current experiences, and 1-2 comorbidities: radiation and SLNB  are also affecting patient's functional outcome.    REHAB POTENTIAL: Excellent  CLINICAL DECISION MAKING: Stable/uncomplicated  EVALUATION COMPLEXITY: Low  GOALS: Goals reviewed with patient? Yes   LONG TERM GOALS: Target date: 07/15/2021   (Remove Blue Hyperlink)  Pt will decrease feelings of left upper quadrant edema and tightness by at least 50% Baseline:  Goal status: INITIAL  2.  Pt will be ind with self MLD or use of compression pump Baseline:  Goal status: INITIAL  3.  Pt will be scheduled for continued SOZO surveillance Baseline:  Goal status: INITIAL  4.  Pt will be ind with final HEP Baseline:  Goal status: INITIAL PLAN: PT FREQUENCY: 1x/week  PT DURATION: 6 weeks  PLANNED INTERVENTIONS: Therapeutic exercises, Patient/Family education, Joint mobilization, DME instructions, Moist heat, Manual lymph drainage, and Manual therapy  PLAN FOR NEXT SESSION: flexi demo back? don't forget to schedule out SOZO, how was foam?, Left breast MLD, gentle STM lat and scapular region   Stark Bray, PT 06/24/2021, 1:52 PM

## 2021-06-25 ENCOUNTER — Ambulatory Visit
Admission: EM | Admit: 2021-06-25 | Discharge: 2021-06-25 | Disposition: A | Discharge status: Home / Self Care | Attending: Emergency Medicine | Admitting: Emergency Medicine

## 2021-06-25 DIAGNOSIS — M79671 Pain in right foot: Secondary | ICD-10-CM

## 2021-06-25 MED ORDER — METHYLPREDNISOLONE 4 MG PO TBPK: ORAL_TABLET | ORAL | 0 refills | Status: DC

## 2021-06-25 NOTE — ED Triage Notes (Signed)
Pt here with C/O right foot pain for 3 weeks, no known injury, sits with right foot under leg often.

## 2021-06-25 NOTE — Discharge Instructions (Addendum)
Take the Medrol dose pack as directed for the next 6 days.  Apply moist heat to your foot for 20 minutes at a time, 2-3 times a day.  Wear supportive footwear.  If your pain does not improve follow-up with podiatry.

## 2021-06-25 NOTE — ED Provider Notes (Signed)
[facility]    CSN: [id number] Arrival date & time: 06/25/21  1032      History   Chief Complaint Chief Complaint  Patient presents with   Foot Pain    HPI Gabriela Evans is a 41 y.o. female.   HPI  41 year old female here for evaluation of right foot pain.  Patient reports that she has been experiencing pain on the dorsum of right foot for approximately the past 3 weeks.  It is intermittent and occurs when she flexes her toes back-and-forth, will give her foot into her tennis shoe, or stands on her toes for a long time trying to reach a high object.  When she is walking she does not tend to have pain.  She denies any injury, bruising, or redness.  She was recently on tamoxifen for breast cancer and they stopped it because she had a skin reaction.  She has not been on tamoxifen for the past 3 weeks.  She is currently on antibiotics for a breast infection and lymphedema.  She has been walking per her oncologist's recommendation but states she does not have pain when she walks.  She denies numbness or tingling in her toes.  Past Medical History:  Diagnosis Date   Anemia    Breast cancer ([place])    Complication of anesthesia    nausea   Gestational diabetes    Kidney infection    Other abnormal glucose    Gestational diabetes with both children   Personal history of radiation therapy    Pneumonia    01-2019   PONV (postoperative nausea and vomiting)     Patient Active Problem List   Diagnosis Date Noted   Genetic testing 11/07/2020   Malignant neoplasm of upper-outer quadrant of left breast in female, estrogen receptor positive ([place]) 10/28/2020   Postoperative state 04/26/2019   Post-operative state 04/26/2019   Menorrhagia 03/21/2018    Past Surgical History:  Procedure Laterality Date   BREAST LUMPECTOMY     BREAST LUMPECTOMY WITH RADIOACTIVE SEED AND SENTINEL LYMPH NODE BIOPSY Left 11/29/2020   Procedure: LEFT BREAST LUMPECTOMY WITH RADIOACTIVE SEED AND  SENTINEL LYMPH NODE BIOPSY;  Surgeon: Jovita Kussmaul, MD;  Location: [facility];  Service: General;  Laterality: Left;   CESAREAN SECTION     X2   IUD inserted in [place]  2009   remove perforated IUD   ROBOTIC ASSISTED LAPAROSCOPIC LYSIS OF ADHESION N/A 04/26/2019   Procedure: XI ROBOTIC ASSISTED LAPAROSCOPIC LYSIS OF ADHESION;  Surgeon: [name], MD;  Location: [facility];  Service: Gynecology;  Laterality: N/A;   ROBOTIC ASSISTED TOTAL HYSTERECTOMY WITH BILATERAL SALPINGO OOPHERECTOMY Bilateral 04/26/2019   Procedure: XI ROBOTIC ASSISTED TOTAL LAPAROSCOPIC HYSTERECTOMY WITH BILATERAL SALPINGECTOMY;  Surgeon: [name], MD;  Location: [facility];  Service: Gynecology;  Laterality: Bilateral;  requests 8:30 OR start in [place] block time requests 2 hours   WISDOM TOOTH EXTRACTION      OB History     Gravida  2   Para  2   Term  1   Preterm      AB  0   Living  2      SAB      IAB      Ectopic  0   Multiple      Live Births               Home Medications  Prior to Admission medications   Medication Sig Start Date End Date Taking? Authorizing Provider  methylPREDNISolone (MEDROL DOSEPAK) 4 MG TBPK tablet Take according to the package insert. 06/25/21  Yes Margarette Canada, NP  acetaminophen (TYLENOL) 500 MG tablet Take 500 mg by mouth every 6 (six) hours as needed.    [provider]  BLACK ELDERBERRY,BERRY-FLOWER, PO Take 1 capsule by mouth daily.    [provider]  Charcoal Activated (ACTIVATED CHARCOAL) POWD by Does not apply route as needed.    [provider]  gabapentin (NEURONTIN) 100 MG capsule Take 100 mg by mouth 3 (three) times daily. 04/04/21   [provider]  ibuprofen (ADVIL) 400 MG tablet Take 400 mg by mouth every 6 (six) hours as needed.    [provider]  levofloxacin (LEVAQUIN) 750 MG tablet Take 1 tablet (750  mg total) by mouth daily. 06/18/21   Nicholas Lose, MD  Medium Chain Triglycerides (MCT OIL PO) Take by mouth daily.    [provider]  Multiple Vitamins-Minerals (MULTIVITAMIN WITH MINERALS) tablet Take 2 tablets by mouth daily.    [provider]  Probiotic Product (PROBIOTIC PO) Take 1 tablet by mouth daily.    [provider]  Theanine 200 MG CAPS Take by mouth.    [provider]  vitamin C (ASCORBIC ACID) 250 MG tablet Take 250 mg by mouth daily.    [provider]  vitamin E 1000 UNIT capsule Take 1,000 Units by mouth daily.    [provider]  ferrous sulfate 325 (65 FE) MG tablet Take 325 mg by mouth daily with breakfast.  10/16/19  [provider]    Family History Family History  Problem Relation Age of Onset   Diabetes Mother    Thyroid disease Father    Diabetes Father    Thyroid disease Sister    Thyroid cancer Maternal Aunt        papillary; dx 25s   Brain cancer Maternal Uncle 2       gliolblastoma   Rectal cancer Paternal Uncle        mets; dx 81s   Cancer Paternal Uncle        unknown type; dx 30s   Thyroid cancer Maternal Grandmother        follicular   Prostate cancer Maternal Grandfather        dx 55s   Melanoma Paternal Grandmother        mets; dx 66s    Social History Social History   Tobacco Use   Smoking status: Never   Smokeless tobacco: Never  Vaping Use   Vaping Use: Never used  Substance Use Topics   Alcohol use: Yes    Comment: occas   Drug use: No     Allergies   Nickel, Other, Tramadol, Oxycodone-acetaminophen, and Tamoxifen   Review of Systems Review of Systems  Musculoskeletal:  Positive for myalgias. Negative for arthralgias and joint swelling.  Skin:  Negative for color change and wound.  Neurological:  Negative for weakness and numbness.  Hematological: Negative.   Psychiatric/Behavioral: Negative.      Physical Exam Triage Vital Signs ED Triage Vitals   Enc Vitals Group     BP 06/25/21 1112 120/83     Pulse Rate 06/25/21 1112 66     Resp 06/25/21 1112 18     Temp 06/25/21 1112 98.9 F (37.2 C)     Temp Source 06/25/21 1112 Oral  SpO2 --      Weight 06/25/21 1111 214 lb (97.1 kg)     Height 06/25/21 1111 '5\' 9"'$  (1.753 m)     Head Circumference --      Peak Flow --      Pain Score 06/25/21 1111 0     Pain Loc --      Pain Edu? --      Excl. in [place]? --    No data found.  Updated Vital Signs BP 120/83 (BP Location: Left Arm)   Pulse 66   Temp 98.9 F (37.2 C) (Oral)   Resp 18   Ht '5\' 9"'$  (1.753 m)   Wt 214 lb (97.1 kg)   LMP 04/18/2019   BMI 31.60 kg/m   Visual Acuity Right Eye Distance:   Left Eye Distance:   Bilateral Distance:    Right Eye Near:   Left Eye Near:    Bilateral Near:     Physical Exam Vitals and nursing note reviewed.  Constitutional:      Appearance: Normal appearance. She is not ill-appearing.  Musculoskeletal:        General: Tenderness present. No swelling, deformity or signs of injury. Normal range of motion.  Skin:    General: Skin is warm and dry.     Capillary Refill: Capillary refill takes less than 2 seconds.     Findings: No bruising or erythema.  Neurological:     General: No focal deficit present.     Mental Status: She is alert and oriented to person, place, and time.  Psychiatric:        Mood and Affect: Mood normal.        Behavior: Behavior normal.        Thought Content: Thought content normal.        Judgment: Judgment normal.     UC Treatments / Results  Labs (all labs ordered are listed, but only abnormal results are displayed) Labs Reviewed - No data to display  EKG   Radiology No results found.  Procedures Procedures (including critical care time)  Medications Ordered in UC Medications - No data to display  Initial Impression / Assessment and Plan / UC Course  I have reviewed the triage vital signs and the nursing notes.  Pertinent labs & imaging  results that were available during my care of the patient were reviewed by me and considered in my medical decision making (see chart for details).  Patient is a very pleasant, nontoxic-appearing 41 year old female here for evaluation of right foot pain that is been going on for the past 3 weeks outlined HPI above.  Patient's pain is on the dorsum of her foot in line with her right fourth toe.  When she flexes or extends her toes she has an increase in pain.  There is no increase with passive range of motion however.  When palpating along the length of the fourth metatarsal patient does have tenderness but it appears more in the soft tissue than the bone.  There is no crepitus appreciated.  There is no swelling, ecchymosis, or erythema noted.  She has full sensation range of motion of her toes.  She has no other tenderness when palpating any of the bones of her midfoot or any other tarsal bones.  No pain with palpation of her calcaneus or arch.  No pain with palpation of medial or lateral malleolus.  I am suspicious that patient has some mild tendinopathy of one of her flexor tendons  of her toe and I will treat her with a Medrol Dosepak and moist heat.  This may be a result of the tamoxifen that she was on though she has not been on 3 weeks and I would have expected improvement of her symptoms after cessation of the medication.  I have also encouraged her to wear supportive footwear.  If her pain does not improve I have suggested that she follow-up with podiatry.   Final Clinical Impressions(s) / UC Diagnoses   Final diagnoses:  Foot pain, right     Discharge Instructions      Take the Medrol dose pack as directed for the next 6 days.  Apply moist heat to your foot for 20 minutes at a time, 2-3 times a day.  Wear supportive footwear.  If your pain does not improve follow-up with podiatry.      ED Prescriptions     Medication Sig Dispense Auth. Provider   methylPREDNISolone (MEDROL  DOSEPAK) 4 MG TBPK tablet Take according to the package insert. 1 each Margarette Canada, NP      PDMP not reviewed this encounter.   Margarette Canada, NP 06/25/21 1214

## 2021-06-27 ENCOUNTER — Encounter: Payer: Self-pay | Admitting: Rehabilitation

## 2021-06-30 ENCOUNTER — Encounter: Payer: Self-pay | Admitting: Physical Therapy

## 2021-07-01 ENCOUNTER — Ambulatory Visit: Admitting: Physical Therapy

## 2021-07-08 ENCOUNTER — Encounter: Admitting: Rehabilitation

## 2021-07-08 ENCOUNTER — Emergency Department

## 2021-07-08 ENCOUNTER — Other Ambulatory Visit: Payer: Self-pay

## 2021-07-08 ENCOUNTER — Observation Stay: Admitting: Anesthesiology

## 2021-07-08 ENCOUNTER — Encounter
Admission: EM | Disposition: A | Discharge status: Home / Self Care | Payer: Self-pay | Source: Home / Self Care | Attending: Emergency Medicine

## 2021-07-08 ENCOUNTER — Observation Stay
Admission: EM | Admit: 2021-07-08 | Discharge: 2021-07-08 | Disposition: A | Discharge status: Home / Self Care | Attending: Surgery | Admitting: Surgery

## 2021-07-08 ENCOUNTER — Encounter: Payer: Self-pay | Admitting: Emergency Medicine

## 2021-07-08 DIAGNOSIS — Z79899 Other long term (current) drug therapy: Secondary | ICD-10-CM | POA: Diagnosis not present

## 2021-07-08 DIAGNOSIS — R1031 Right lower quadrant pain: Secondary | ICD-10-CM | POA: Diagnosis present

## 2021-07-08 DIAGNOSIS — K3580 Unspecified acute appendicitis: Secondary | ICD-10-CM | POA: Diagnosis not present

## 2021-07-08 DIAGNOSIS — Z853 Personal history of malignant neoplasm of breast: Secondary | ICD-10-CM | POA: Insufficient documentation

## 2021-07-08 DIAGNOSIS — K353 Acute appendicitis with localized peritonitis, without perforation or gangrene: Principal | ICD-10-CM | POA: Insufficient documentation

## 2021-07-08 HISTORY — PX: LAPAROSCOPIC APPENDECTOMY: SHX408

## 2021-07-08 LAB — COMPREHENSIVE METABOLIC PANEL
ALT: 17 U/L (ref 0–44)
AST: 15 U/L (ref 15–41)
Albumin: 4 g/dL (ref 3.5–5.0)
Alkaline Phosphatase: 60 U/L (ref 38–126)
Anion gap: 8 (ref 5–15)
BUN: 13 mg/dL (ref 6–20)
CO2: 27 mmol/L (ref 22–32)
Calcium: 9.4 mg/dL (ref 8.9–10.3)
Chloride: 106 mmol/L (ref 98–111)
Creatinine, Ser: 0.64 mg/dL (ref 0.44–1.00)
GFR, Estimated: >60 mL/min (ref >60)
Glucose, Bld: 104 mg/dL — ABNORMAL HIGH (ref 70–99)
Potassium: 4 mmol/L (ref 3.5–5.1)
Sodium: 141 mmol/L (ref 135–145)
Total Bilirubin: 0.9 mg/dL (ref 0.3–1.2)
Total Protein: 7.3 g/dL (ref 6.5–8.1)

## 2021-07-08 LAB — URINALYSIS, ROUTINE W REFLEX MICROSCOPIC
Bilirubin Urine: NEGATIVE
Glucose, UA: NEGATIVE mg/dL
Hgb urine dipstick: NEGATIVE
Ketones, ur: NEGATIVE mg/dL
Leukocytes,Ua: NEGATIVE
Nitrite: NEGATIVE
Protein, ur: NEGATIVE mg/dL
Specific Gravity, Urine: 1.015 (ref 1.005–1.030)
pH: 7 (ref 5.0–8.0)

## 2021-07-08 LAB — CBC
HCT: 40.6 % (ref 36.0–46.0)
Hemoglobin: 13.3 g/dL (ref 12.0–15.0)
MCH: 29.4 pg (ref 26.0–34.0)
MCHC: 32.8 g/dL (ref 30.0–36.0)
MCV: 89.8 fL (ref 80.0–100.0)
Platelets: 272 10*3/uL (ref 150–400)
RBC: 4.52 MIL/uL (ref 3.87–5.11)
RDW: 11.9 % (ref 11.5–15.5)
WBC: 6.8 10*3/uL (ref 4.0–10.5)
nRBC: 0 % (ref 0.0–0.2)

## 2021-07-08 LAB — POC URINE PREG, ED: Preg Test, Ur: NEGATIVE

## 2021-07-08 LAB — LIPASE, BLOOD: Lipase: 37 U/L (ref 11–51)

## 2021-07-08 IMAGING — CT CT ABD-PELV W/ CM
2 of 5 series · 15 of 46 positions shown, 17 images · IV contrast (APPLIED)
Comparison: None Available.

CLINICAL DATA: Right lower quadrant pain that started this morning.
History of breast cancer.

EXAM:
CT ABDOMEN AND PELVIS WITH CONTRAST
TECHNIQUE: Multidetector CT imaging of the abdomen and pelvis was performed
using the standard protocol following bolus administration of
intravenous contrast.

[Series 2: abdomen 5.0 · axial · 0.74mm/px · z∈[-998,-562]mm · 12 of 99 slices shown, 14 images]
[im 6/99  soft-tissue]
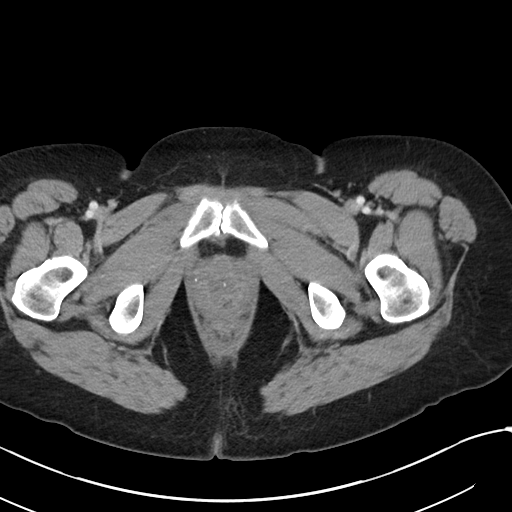
[im 6/99  bone]
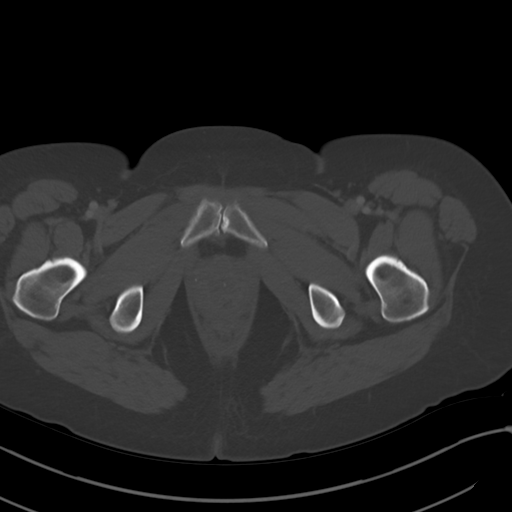
[im 17/99  soft-tissue]
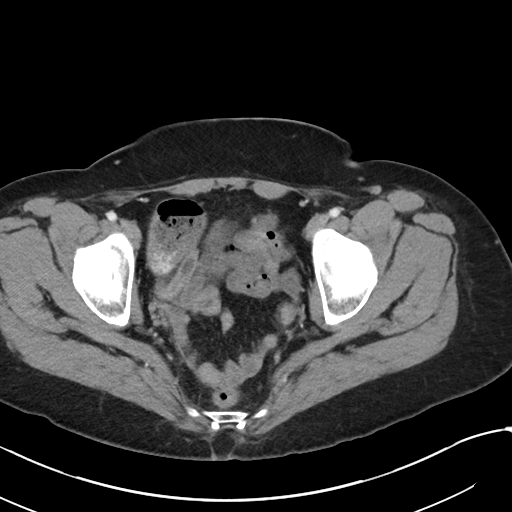
[im 22/99  soft-tissue]
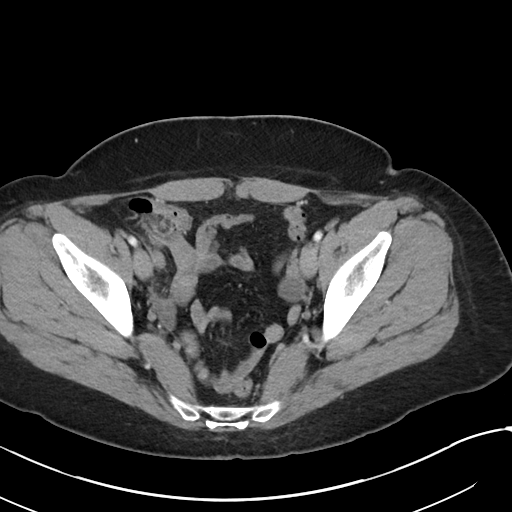
[im 28/99  soft-tissue]
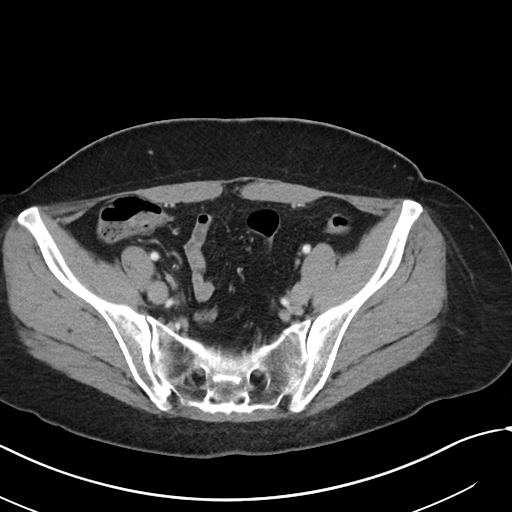
[im 39/99  soft-tissue]
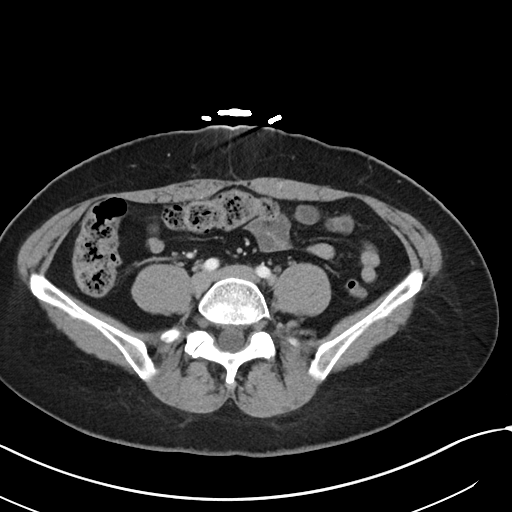
[im 44/99  soft-tissue]
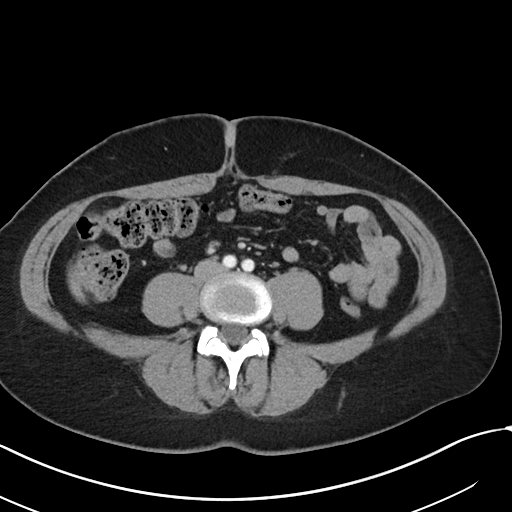
[im 55/99  soft-tissue]
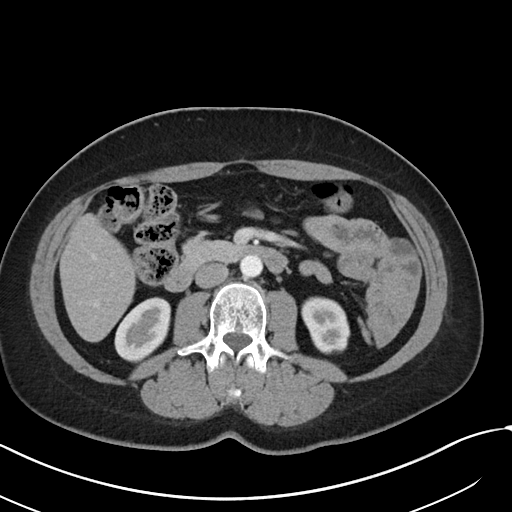
[im 60/99  soft-tissue]
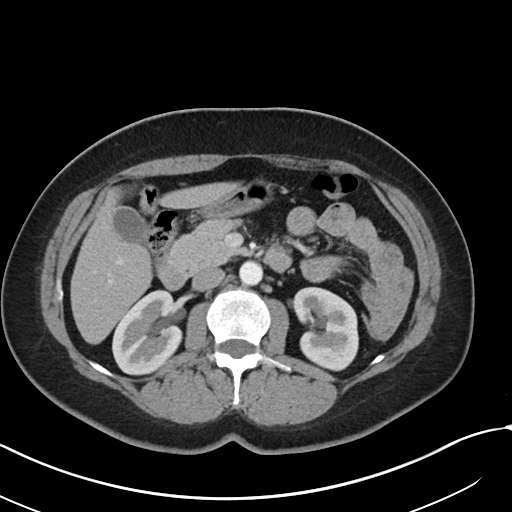
[im 71/99  soft-tissue]
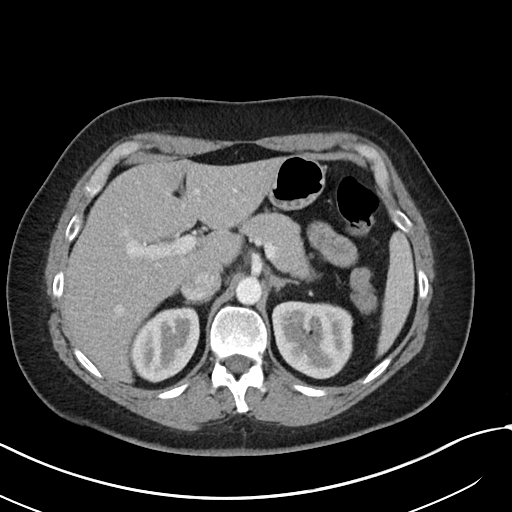
[im 71/99  bone]
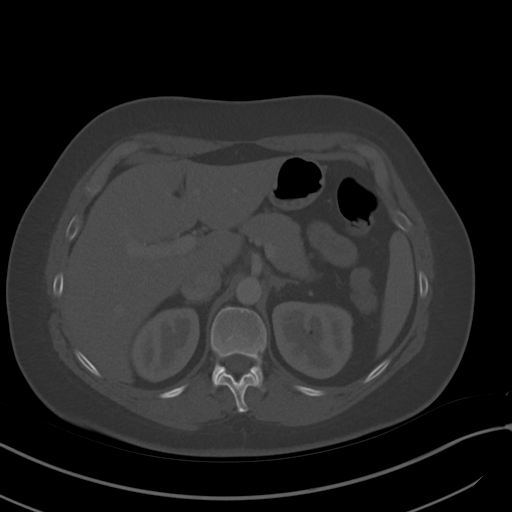
[im 77/99  soft-tissue]
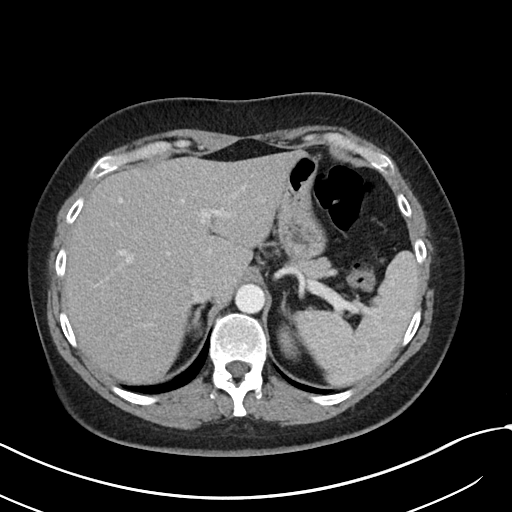
[im 82/99  soft-tissue]
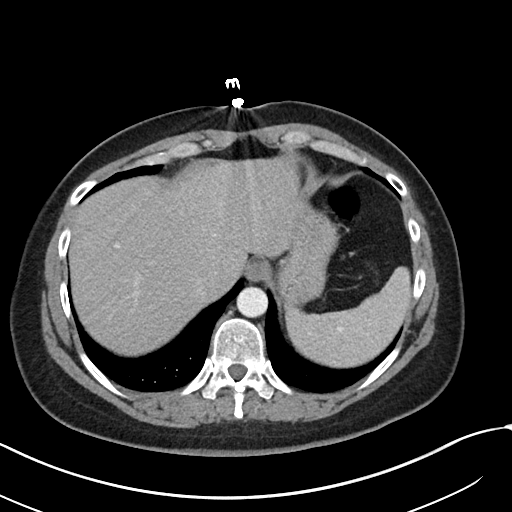
[im 93/99  soft-tissue]
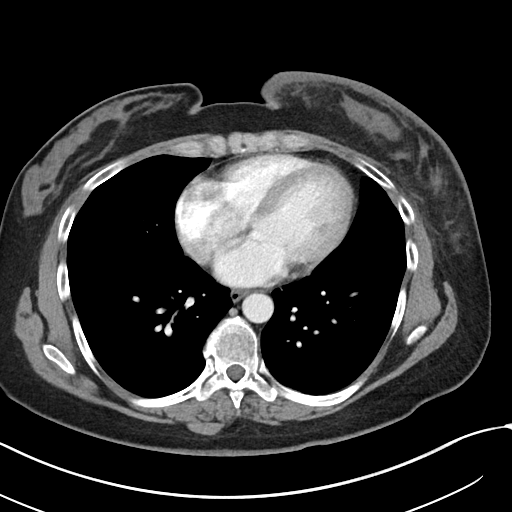

[Series 5: abdomen 3.0 mpr cor · coronal · 0.72mm/px · 3 of 98 slices shown]
[im 33/98  soft-tissue]
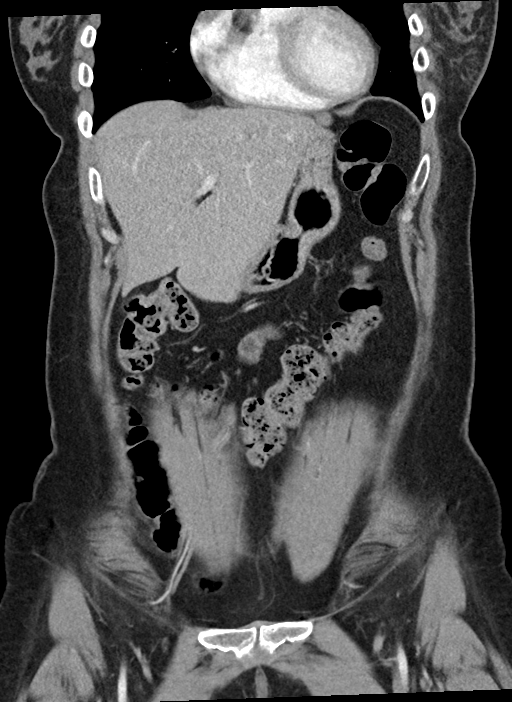
[im 44/98  soft-tissue]
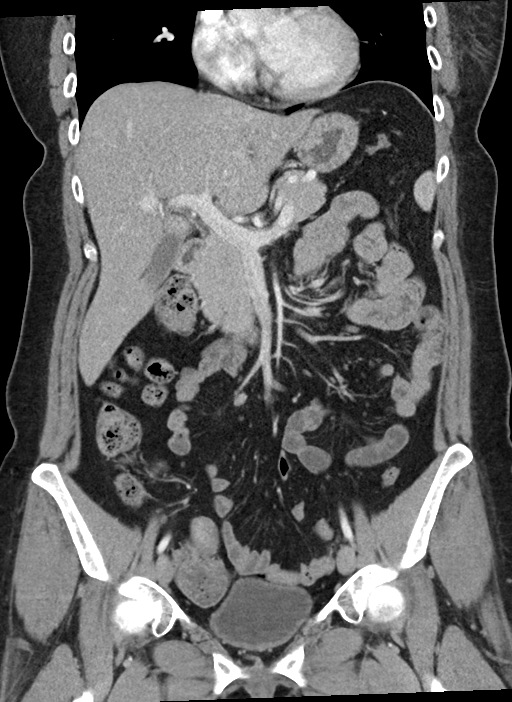
[im 54/98  soft-tissue]
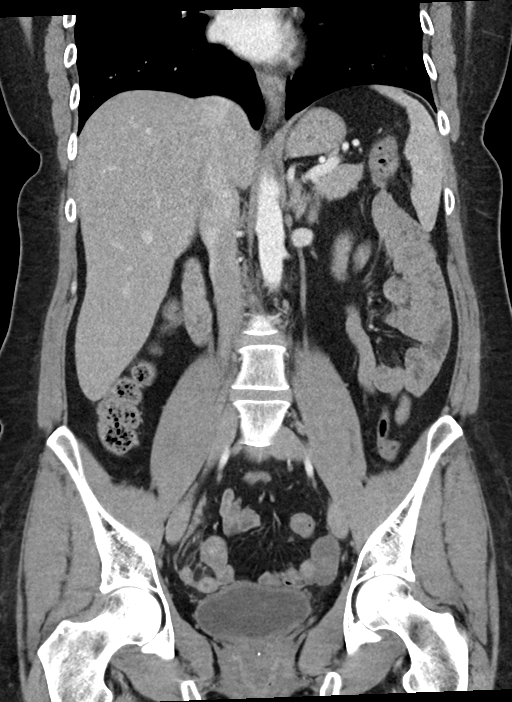

[15 of 46 positions shown; findings below may reference images not displayed]

RADIATION DOSE REDUCTION: This exam was performed according to the
departmental dose-optimization program which includes automated
exposure control, adjustment of the mA and/or kV according to
patient size and/or use of iterative reconstruction technique.

CONTRAST:  100mL OMNIPAQUE IOHEXOL 300 MG/ML  SOLN
FINDINGS: Lower chest: Unremarkable.

Hepatobiliary: No suspicious focal abnormality within the liver
parenchyma. There is no evidence for gallstones, gallbladder wall
thickening, or pericholecystic fluid. No intrahepatic or
extrahepatic biliary dilation.

Pancreas: No focal mass lesion. No dilatation of the main duct. No
intraparenchymal cyst. No peripancreatic edema.

Spleen: No splenomegaly. No focal mass lesion.

Adrenals/Urinary Tract: No adrenal nodule or mass. Kidneys
unremarkable. No evidence for hydroureter. The urinary bladder
appears normal for the degree of distention.

Stomach/Bowel: Stomach is unremarkable. No gastric wall thickening.
No evidence of outlet obstruction. Duodenum is normally positioned
as is the ligament of Treitz. No small bowel wall thickening. No
small bowel dilatation. The terminal ileum is normal. Appendix is
dilated up to 11 mm diameter. The lumen towards the tip is
fluid-filled with subtle mural hyperenhancement and periappendiceal
edema (best seen on coronal 53/5). No gross colonic mass. No colonic
wall thickening.

Vascular/Lymphatic: No abdominal aortic aneurysm. There is no
gastrohepatic or hepatoduodenal ligament lymphadenopathy. No
retroperitoneal or mesenteric lymphadenopathy. No pelvic sidewall
lymphadenopathy.

Reproductive: Hysterectomy.  There is no adnexal mass.

Other: No intraperitoneal free fluid.

Musculoskeletal: No worrisome lytic or sclerotic osseous
abnormality.
IMPRESSION: Dilated, elongated fluid-filled appendix with subtle mural
hyperenhancement and periappendiceal edema. Imaging features are
compatible with acute appendicitis. No perforation or abscess by CT.

## 2021-07-08 SURGERY — APPENDECTOMY, LAPAROSCOPIC
Anesthesia: General

## 2021-07-08 MED ORDER — BUPIVACAINE-EPINEPHRINE 0.25% -1:200000 IJ SOLN
INTRAMUSCULAR | Status: DC | PRN
  Administered 2021-07-08: 30 mL

## 2021-07-08 MED ORDER — HYDROCODONE-ACETAMINOPHEN 5-325 MG PO TABS: 1.0000 | ORAL_TABLET | ORAL | 0 refills | Status: DC | PRN

## 2021-07-08 MED ORDER — PROPOFOL 10 MG/ML IV BOLUS
INTRAVENOUS | Status: DC | PRN
  Administered 2021-07-08: 200 mg via INTRAVENOUS

## 2021-07-08 MED ORDER — HYDROMORPHONE HCL 1 MG/ML IJ SOLN
INTRAMUSCULAR | Status: AC
  Filled 2021-07-08: qty 1

## 2021-07-08 MED ORDER — SUGAMMADEX SODIUM 500 MG/5ML IV SOLN
INTRAVENOUS | Status: AC
  Filled 2021-07-08: qty 5

## 2021-07-08 MED ORDER — SUGAMMADEX SODIUM 500 MG/5ML IV SOLN
INTRAVENOUS | Status: DC | PRN
  Administered 2021-07-08: 400 mg via INTRAVENOUS

## 2021-07-08 MED ORDER — DROPERIDOL 2.5 MG/ML IJ SOLN: 0.6250 mg | Freq: Once | INTRAMUSCULAR | Status: DC | PRN

## 2021-07-08 MED ORDER — SODIUM CHLORIDE 0.9 % IV SOLN: INTRAVENOUS | Status: DC

## 2021-07-08 MED ORDER — DEXAMETHASONE SODIUM PHOSPHATE 10 MG/ML IJ SOLN
INTRAMUSCULAR | Status: AC
  Filled 2021-07-08: qty 1

## 2021-07-08 MED ORDER — FENTANYL CITRATE (PF) 100 MCG/2ML IJ SOLN
INTRAMUSCULAR | Status: DC | PRN
  Administered 2021-07-08 (×2): 50 ug via INTRAVENOUS

## 2021-07-08 MED ORDER — PROMETHAZINE HCL 25 MG/ML IJ SOLN: 6.2500 mg | INTRAMUSCULAR | Status: DC | PRN

## 2021-07-08 MED ORDER — ONDANSETRON 4 MG PO TBDP: 4.0000 mg | ORAL_TABLET | Freq: Four times a day (QID) | ORAL | Status: DC | PRN

## 2021-07-08 MED ORDER — IOHEXOL 300 MG/ML  SOLN
100.0000 mL | Freq: Once | INTRAMUSCULAR | Status: AC | PRN
  Administered 2021-07-08: 100 mL via INTRAVENOUS

## 2021-07-08 MED ORDER — SEVOFLURANE IN SOLN
RESPIRATORY_TRACT | Status: AC
  Filled 2021-07-08: qty 250

## 2021-07-08 MED ORDER — MIDAZOLAM HCL 2 MG/2ML IJ SOLN
INTRAMUSCULAR | Status: AC
  Filled 2021-07-08: qty 2

## 2021-07-08 MED ORDER — ACETAMINOPHEN 10 MG/ML IV SOLN
INTRAVENOUS | Status: AC
  Filled 2021-07-08: qty 100

## 2021-07-08 MED ORDER — ROCURONIUM BROMIDE 100 MG/10ML IV SOLN
INTRAVENOUS | Status: DC | PRN
  Administered 2021-07-08: 30 mg via INTRAVENOUS
  Administered 2021-07-08: 50 mg via INTRAVENOUS

## 2021-07-08 MED ORDER — LIDOCAINE HCL (CARDIAC) PF 100 MG/5ML IV SOSY
PREFILLED_SYRINGE | INTRAVENOUS | Status: DC | PRN
  Administered 2021-07-08: 100 mg via INTRAVENOUS

## 2021-07-08 MED ORDER — ROCURONIUM BROMIDE 10 MG/ML (PF) SYRINGE
PREFILLED_SYRINGE | INTRAVENOUS | Status: AC
  Filled 2021-07-08: qty 10

## 2021-07-08 MED ORDER — ONDANSETRON HCL 4 MG/2ML IJ SOLN
INTRAMUSCULAR | Status: DC | PRN
  Administered 2021-07-08: 4 mg via INTRAVENOUS

## 2021-07-08 MED ORDER — KETAMINE HCL-SODIUM CHLORIDE 100-0.9 MG/10ML-% IV SOSY
PREFILLED_SYRINGE | INTRAVENOUS | Status: DC | PRN
  Administered 2021-07-08: 40 mg via INTRAVENOUS

## 2021-07-08 MED ORDER — DEXAMETHASONE SODIUM PHOSPHATE 10 MG/ML IJ SOLN
INTRAMUSCULAR | Status: DC | PRN
  Administered 2021-07-08: 10 mg via INTRAVENOUS

## 2021-07-08 MED ORDER — MIDAZOLAM HCL 2 MG/2ML IJ SOLN
INTRAMUSCULAR | Status: DC | PRN
  Administered 2021-07-08: 2 mg via INTRAVENOUS

## 2021-07-08 MED ORDER — PIPERACILLIN-TAZOBACTAM 3.375 G IVPB
3.3750 g | Freq: Three times a day (TID) | INTRAVENOUS | Status: DC
  Administered 2021-07-08: 3.375 g via INTRAVENOUS

## 2021-07-08 MED ORDER — PROPOFOL 500 MG/50ML IV EMUL
INTRAVENOUS | Status: DC | PRN
  Administered 2021-07-08: 150 ug/kg/min via INTRAVENOUS

## 2021-07-08 MED ORDER — BUPIVACAINE LIPOSOME 1.3 % IJ SUSP
INTRAMUSCULAR | Status: DC | PRN
  Administered 2021-07-08: 20 mL

## 2021-07-08 MED ORDER — ACETAMINOPHEN 10 MG/ML IV SOLN
INTRAVENOUS | Status: DC | PRN
  Administered 2021-07-08: 1000 mg via INTRAVENOUS

## 2021-07-08 MED ORDER — PROPOFOL 1000 MG/100ML IV EMUL
INTRAVENOUS | Status: AC
  Filled 2021-07-08: qty 100

## 2021-07-08 MED ORDER — ONDANSETRON HCL 4 MG/2ML IJ SOLN
INTRAMUSCULAR | Status: AC
  Filled 2021-07-08: qty 2

## 2021-07-08 MED ORDER — MORPHINE SULFATE (PF) 2 MG/ML IV SOLN: 2.0000 mg | INTRAVENOUS | Status: DC | PRN

## 2021-07-08 MED ORDER — FENTANYL CITRATE (PF) 100 MCG/2ML IJ SOLN
INTRAMUSCULAR | Status: AC
  Filled 2021-07-08: qty 2

## 2021-07-08 MED ORDER — BUPIVACAINE LIPOSOME 1.3 % IJ SUSP
INTRAMUSCULAR | Status: AC
  Filled 2021-07-08: qty 20

## 2021-07-08 MED ORDER — ONDANSETRON HCL 4 MG/2ML IJ SOLN: 4.0000 mg | Freq: Four times a day (QID) | INTRAMUSCULAR | Status: DC | PRN

## 2021-07-08 MED ORDER — LACTATED RINGERS IV BOLUS
1000.0000 mL | Freq: Once | INTRAVENOUS | Status: AC
  Administered 2021-07-08: 1000 mL via INTRAVENOUS

## 2021-07-08 MED ORDER — HYDROMORPHONE HCL 1 MG/ML IJ SOLN
0.2500 mg | INTRAMUSCULAR | Status: DC | PRN
  Administered 2021-07-08: 0.5 mg via INTRAVENOUS
  Administered 2021-07-08 (×2): 0.25 mg via INTRAVENOUS

## 2021-07-08 MED ORDER — KETAMINE HCL 50 MG/5ML IJ SOSY
PREFILLED_SYRINGE | INTRAMUSCULAR | Status: AC
  Filled 2021-07-08: qty 5

## 2021-07-08 MED ORDER — 0.9 % SODIUM CHLORIDE (POUR BTL) OPTIME
TOPICAL | Status: DC | PRN
  Administered 2021-07-08: 1000 mL

## 2021-07-08 MED ORDER — BUPIVACAINE-EPINEPHRINE (PF) 0.25% -1:200000 IJ SOLN
INTRAMUSCULAR | Status: AC
  Filled 2021-07-08: qty 30

## 2021-07-08 MED ORDER — PIPERACILLIN-TAZOBACTAM 3.375 G IVPB 30 MIN
3.3750 g | Freq: Once | INTRAVENOUS | Status: DC
  Filled 2021-07-08: qty 50

## 2021-07-08 SURGICAL SUPPLY — 39 items
APPLIER CLIP 5 13 M/L LIGAMAX5 (MISCELLANEOUS)
BLADE CLIPPER SURG (BLADE) ×1 IMPLANT
CLIP APPLIE 5 13 M/L LIGAMAX5 (MISCELLANEOUS) ×1 IMPLANT
CUTTER FLEX LINEAR 45M (STAPLE) ×2 IMPLANT
DERMABOND ADVANCED (GAUZE/BANDAGES/DRESSINGS) ×1
DERMABOND ADVANCED .7 DNX12 (GAUZE/BANDAGES/DRESSINGS) ×1 IMPLANT
ELECT CAUTERY BLADE 6.4 (BLADE) ×2 IMPLANT
ELECT REM PT RETURN 9FT ADLT (ELECTROSURGICAL) ×2
ELECTRODE REM PT RTRN 9FT ADLT (ELECTROSURGICAL) ×1 IMPLANT
GLOVE BIO SURGEON STRL SZ7 (GLOVE) ×2 IMPLANT
GOWN STRL REUS W/ TWL LRG LVL3 (GOWN DISPOSABLE) ×2 IMPLANT
GOWN STRL REUS W/TWL LRG LVL3 (GOWN DISPOSABLE) ×2
IRRIGATION STRYKERFLOW (MISCELLANEOUS) ×1 IMPLANT
IRRIGATOR STRYKERFLOW (MISCELLANEOUS) ×2
IV NS 1000ML (IV SOLUTION) ×1
IV NS 1000ML BAXH (IV SOLUTION) ×1 IMPLANT
MANIFOLD NEPTUNE II (INSTRUMENTS) ×2 IMPLANT
NEEDLE HYPO 22GX1.5 SAFETY (NEEDLE) ×2 IMPLANT
NS IRRIG 500ML POUR BTL (IV SOLUTION) ×2 IMPLANT
PACK LAP CHOLECYSTECTOMY (MISCELLANEOUS) ×2 IMPLANT
PENCIL ELECTRO HAND CTR (MISCELLANEOUS) ×2 IMPLANT
RELOAD 45 VASCULAR/THIN (ENDOMECHANICALS) ×2 IMPLANT
RELOAD STAPLE 45 2.5 WHT GRN (ENDOMECHANICALS) ×1 IMPLANT
RELOAD STAPLE 45 3.5 BLU ETS (ENDOMECHANICALS) ×1 IMPLANT
RELOAD STAPLE TA45 3.5 REG BLU (ENDOMECHANICALS) IMPLANT
SCISSORS METZENBAUM CVD 33 (INSTRUMENTS) IMPLANT
SHEARS HARMONIC ACE PLUS 36CM (ENDOMECHANICALS) ×2 IMPLANT
SLEEVE ENDOPATH XCEL 5M (ENDOMECHANICALS) ×2 IMPLANT
SPONGE T-LAP 18X18 ~~LOC~~+RFID (SPONGE) ×2 IMPLANT
SUT MNCRL AB 4-0 PS2 18 (SUTURE) ×2 IMPLANT
SUT VICRYL 0 AB UR-6 (SUTURE) ×4 IMPLANT
SYR 20ML LL LF (SYRINGE) ×2 IMPLANT
SYS BAG RETRIEVAL 10MM (BASKET) ×2
SYSTEM BAG RETRIEVAL 10MM (BASKET) ×1 IMPLANT
TRAY FOLEY MTR SLVR 16FR STAT (SET/KITS/TRAYS/PACK) ×1 IMPLANT
TROCAR XCEL BLUNT TIP 100MML (ENDOMECHANICALS) ×2 IMPLANT
TROCAR XCEL NON-BLD 5MMX100MML (ENDOMECHANICALS) ×2 IMPLANT
TUBING EVAC SMOKE HEATED PNEUM (TUBING) ×2 IMPLANT
WATER STERILE IRR 500ML POUR (IV SOLUTION) ×1 IMPLANT

## 2021-07-08 NOTE — Anesthesia Preprocedure Evaluation (Addendum)
Anesthesia Evaluation  Patient identified by MRN, date of birth, ID band Patient awake    Reviewed: Allergy & Precautions, NPO status , Patient's Chart, lab work & pertinent test results  History of Anesthesia Complications (+) PONV and history of anesthetic complications  Airway Mallampati: II  TM Distance: >3 FB Neck ROM: full    Dental no notable dental hx.    Pulmonary neg pulmonary ROS,    Pulmonary exam normal        Cardiovascular negative cardio ROS Normal cardiovascular exam     Neuro/Psych negative neurological ROS  negative psych ROS   GI/Hepatic negative GI ROS, Neg liver ROS,   Endo/Other  negative endocrine ROS  Renal/GU      Musculoskeletal   Abdominal Normal abdominal exam  (+)   Peds  Hematology negative hematology ROS (+)   Anesthesia Other Findings acute uncomplicated appendicitis  Past Medical History: No date: Anemia No date: Breast cancer ([place]) No date: Complication of anesthesia     Comment:  nausea No date: Gestational diabetes No date: Kidney infection No date: Other abnormal glucose     Comment:  Gestational diabetes with both children No date: Personal history of radiation therapy No date: Pneumonia     Comment:  01-2019 No date: PONV (postoperative nausea and vomiting)  Past Surgical History: No date: BREAST LUMPECTOMY 11/29/2020: BREAST LUMPECTOMY WITH RADIOACTIVE SEED AND SENTINEL  LYMPH NODE BIOPSY; Left     Comment:  Procedure: LEFT BREAST LUMPECTOMY WITH RADIOACTIVE SEED               AND SENTINEL LYMPH NODE BIOPSY;  Surgeon: Jovita Kussmaul,              MD;  Location: [facility];  Service:               General;  Laterality: Left; No date: CESAREAN SECTION     Comment:  X2 No date: IUD inserted in OR 2009: PELVIC LAPAROSCOPY     Comment:  remove perforated IUD 04/26/2019: [place]; N/A     Comment:  Procedure:  XI ROBOTIC ASSISTED LAPAROSCOPIC LYSIS OF               ADHESION;  Surgeon: [name], MD;  Location:               [facility];  Service: Gynecology;                Laterality: N/A; 04/26/2019: ROBOTIC ASSISTED TOTAL HYSTERECTOMY WITH BILATERAL  SALPINGO OOPHERECTOMY; Bilateral     Comment:  Procedure: XI ROBOTIC ASSISTED TOTAL LAPAROSCOPIC               HYSTERECTOMY WITH BILATERAL SALPINGECTOMY;  Surgeon:               [name], MD;  Location: [facility];  Service: Gynecology;  Laterality: Bilateral;                requests 8:30 OR start in [place] block               time requests 2 hours No date: WISDOM TOOTH EXTRACTION  BMI    Body Mass Index: 30.57 kg/m      Reproductive/Obstetrics negative OB ROS  Anesthesia Physical Anesthesia Plan  ASA: 2  Anesthesia Plan: General ETT   Post-op Pain Management: Ofirmev IV (intra-op)* and Toradol IV (intra-op)*   Induction: Intravenous  PONV Risk Score and Plan: 4 or greater and Ondansetron, Dexamethasone, Midazolam, Propofol infusion and TIVA  Airway Management Planned: Oral ETT  Additional Equipment:   Intra-op Plan:   Post-operative Plan: Extubation in OR  Informed Consent: I have reviewed the patients History and Physical, chart, labs and discussed the procedure including the risks, benefits and alternatives for the proposed anesthesia with the patient or authorized representative who has indicated his/her understanding and acceptance.     Dental Advisory Given  Plan Discussed with: Anesthesiologist, CRNA and Surgeon  Anesthesia Plan Comments:        Anesthesia Quick Evaluation

## 2021-07-08 NOTE — ED Notes (Signed)
See triage note  presents with abd pain states she woke up with the pain this am   states pain is mainly to right lower quad  denies any fever or n/v

## 2021-07-08 NOTE — ED Notes (Signed)
Pt belongs were given to her husband:  A watch [employer]

## 2021-07-08 NOTE — ED Triage Notes (Addendum)
Pt here with RLQ pain that started this morning. Pt denies N/V/D. Pt states pain stays in that area and only radiates to her belly button. Pt also c/o bloating. Pt also states she had breast CA last year and has been dealing with lymphedema.

## 2021-07-08 NOTE — Anesthesia Procedure Notes (Signed)
Procedure Name: Intubation Date/Time: 07/08/2021 3:07 PM  Performed by: Kerri Perches, CRNAPre-anesthesia Checklist: Patient identified, Emergency Drugs available, Suction available and Patient being monitored Patient Re-evaluated:Patient Re-evaluated prior to induction Oxygen Delivery Method: Circle system utilized Preoxygenation: Pre-oxygenation with 100% oxygen Induction Type: IV induction Ventilation: Mask ventilation without difficulty Laryngoscope Size: Mac and 4 Grade View: Grade I Tube type: Oral Tube size: 7.0 mm Number of attempts: 1 Airway Equipment and Method: Stylet and Oral airway Placement Confirmation: ETT inserted through vocal cords under direct vision, positive ETCO2 and breath sounds checked- equal and bilateral Secured at: 21 cm Tube secured with: Tape Dental Injury: Teeth and Oropharynx as per pre-operative assessment  Comments: Intubated by Everlene Other SRNA

## 2021-07-08 NOTE — Discharge Summary (Signed)
  Patient ID: AMYIA LODWICK MRN: [id number] DOB/AGE: 09/03/1980 41 y.o.  Admit date: 07/08/2021 Discharge date: 07/08/2021   Discharge Diagnoses:  Principal Problem:   Acute appendicitis   Procedures:lap appendectomy  Hospital Course:   admitted with findings consistent with acute appendicitis and  was taken promptly to the operating room for an uneventful laparoscopic Appendectomy.  Patient was kept  for a few hrs after the procedure. At The time of discharge the patient was ambulating,  pain was controlled.  Her vital signs were stable and she was afebrile.   physical exam at discharge showed a pt  in no acute distress.  Awake and alert.  Abdomen: Soft incisions healing well without infection or peritonitis.  Extremities well-perfused and no edema.  Condition of the patient the time of discharge was stable     Disposition: Discharge disposition: 01-Home or Self Care       Discharge Instructions     Call MD for:  difficulty breathing, headache or visual disturbances   Complete by: As directed    Call MD for:  extreme fatigue   Complete by: As directed    Call MD for:  hives   Complete by: As directed    Call MD for:  persistant dizziness or light-headedness   Complete by: As directed    Call MD for:  persistant nausea and vomiting   Complete by: As directed    Call MD for:  redness, tenderness, or signs of infection (pain, swelling, redness, odor or green/yellow discharge around incision site)   Complete by: As directed    Call MD for:  severe uncontrolled pain   Complete by: As directed    Call MD for:  temperature >100.4   Complete by: As directed    Diet - low sodium heart healthy   Complete by: As directed    Discharge instructions   Complete by: As directed    Shower 48 hrs   Increase activity slowly   Complete by: As directed    Lifting restrictions   Complete by: As directed    20 lbs x 6 wks        Follow-up Information     Tylene Fantasia,  PA-C Follow up on 07/24/2021.   Specialty: Physician Assistant Contact information: [address] [place] [address] [phone]                  Caroleen Hamman, MD FACS

## 2021-07-08 NOTE — Anesthesia Postprocedure Evaluation (Signed)
Anesthesia Post Note  Patient: Gabriela Evans  Procedure(s) Performed: APPENDECTOMY LAPAROSCOPIC  Patient location during evaluation: PACU Anesthesia Type: General Level of consciousness: awake and alert, oriented and patient cooperative Pain management: pain level controlled Vital Signs Assessment: post-procedure vital signs reviewed and stable Respiratory status: spontaneous breathing, nonlabored ventilation and respiratory function stable Cardiovascular status: blood pressure returned to baseline and stable Postop Assessment: adequate PO intake Anesthetic complications: no   No notable events documented.   Last Vitals:  Vitals:   07/08/21 1645 07/08/21 1659  BP:  93/61  Pulse: (!) 56 67  Resp: 14 13  Temp:  (!) 34.3 C  SpO2: 94% 100%    Last Pain:  Vitals:   07/08/21 1659  TempSrc:   PainSc: 2                  Darrin Nipper

## 2021-07-08 NOTE — Discharge Instructions (Addendum)
Laparoscopic Appendectomy, Adult, Care After The following information offers guidance on how to care for yourself after your procedure. Your health care provider may also give you more specific instructions. If you have problems or questions, contact your health care provider. What can I expect after the procedure? After the procedure, it is common to have: Tiredness (fatigue). Mild pain in the incision areas. Constipation. This may be caused by taking pain medicines and being less active during recovery. Gas pain that can radiate to your shoulders. Follow these instructions at home: Medicines Take over-the-counter and prescription medicines only as told by your health care provider. Finish all antibiotic medicine even if you start to feel better. Take pain medicines before your pain becomes severe. Ask your health care provider if your condition or your medicine: Requires you to avoid driving or using machinery. Can cause constipation. You may need to take these actions to prevent or treat constipation: Drink enough fluid to keep your urine pale yellow. Take over-the-counter or prescription medicines. Eat foods that are high in fiber, such as beans, whole grains, and fresh fruits and vegetables. Limit foods that are high in fat and processed sugars, such as fried or sweet foods. Incision care Two stitched incisions. One is normal. The other is red with pus and infected.   Follow instructions from your health care provider about how to take care of your incisions. Make sure you: Wash your hands with soap and water for at least 20 seconds before and after you change your bandage (dressing). If soap and water are not available, use hand sanitizer. Change your dressing as told by your health care provider. Leave stitches (sutures), staples, skin glue, or adhesive strips in place. These skin closures may need to stay in place for 2 weeks or longer. If adhesive strip edges start to loosen and  curl up, you may trim the loose edges. Do not remove adhesive strips completely unless your health care provider tells you to do that. Check your incision areas every day for signs of infection. Check for: Redness, swelling, or pain. Fluid or blood. Warmth. Pus or a bad smell. Bathing Do not take baths, swim, or use a hot tub until your health care provider approves. Ask your health care provider if you may take showers. You may only be allowed to take sponge baths. Keep your incisions clean and dry. Clean them as often as told by your health care provider. To do this: Gently wash the incisions with soap and water. Rinse the incisions with water to remove all soap. Pat the incisions dry with a clean towel. Do not rub the incisions. Activity A sign showing that a person should not lift anything heavy.   If you were given a sedative during the procedure, it can affect you for several hours. Do not drive or operate machinery until your health care provider says that it is safe. Rest as told by your health care provider. Do not lift anything that is heavier than 10 lb (4.5 kg), or the limit that you are told, until your health care provider says that it is safe. Do not play contact sports until your health care provider tells you that it is safe to do so. Return to your normal activities as told by your health care provider. Ask your health care provider what activities are safe for you. General instructions If you were sent home with a drain, follow instructions from your health care provider about how to care for it. Take  deep breaths. This helps to prevent your lungs from developing an infection (pneumonia). If you need to cough or sneeze, place a pillow or blanket on your abdomen before you do so. This will help you control the pain. Keep all follow-up visits. This is important. Contact a health care provider if: You have redness, swelling, or pain around an incision. You have fluid or  blood coming from an incision. An incision feels warm to the touch. You have pus or a bad smell coming from an incision or dressing. The edges of an incision break open after your sutures have been removed. You lose your appetite, keep feeling nauseous, or you are vomiting. You have diarrhea, or you cannot control your bowel functions. You develop a rash. Get help right away if: You have a fever. You have difficulty breathing. You have sharp pains in your chest. You develop swelling or pain in your legs. You faint. These symptoms may be an emergency. Get help right away. Call 911. Do not wait to see if the symptoms will go away. Do not drive yourself to the hospital. Summary After a laparoscopic appendectomy, it is common to have tiredness, mild pain in the area of the incisions, constipation, and gas pain. Follow your health care provider's instructions about caring for yourself after the procedure. Rest after the procedure. Return to your normal activities as told by your health care provider. Contact your health care provider if you notice signs of infection around your incisions. Get help right away if you develop a fever, chest pain, or difficulty breathing. This information is not intended to replace advice given to you by your health care provider. Make sure you discuss any questions you have with your health care provider. Document Revised: 10/17/2020 Document Reviewed: 10/17/2020 Elsevier Patient Education  [place]   The drugs that you were given will stay in your system until tomorrow so for the next 24 hours you should not:  Drive an automobile Make any legal decisions Drink any alcoholic beverage   You may resume regular meals tomorrow.  Today it is better to start with liquids and gradually work up to solid foods.  You may eat anything you prefer, but it is better to start with liquids, then soup and  crackers, and gradually work up to solid foods.   Please notify your doctor immediately if you have any unusual bleeding, trouble breathing, redness and pain at the surgery site, drainage, fever, or pain not relieved by medication.    Additional Instructions:        Please contact your physician with any problems or Same Day Surgery at [phone], Monday through Friday 6 am to 4 pm, or [place] at [facility] number at [phone].

## 2021-07-08 NOTE — H&P (Signed)
[facility] SURGICAL HISTORY & PHYSICAL (cpt [phone])  HISTORY OF PRESENT ILLNESS (HPI):  41 y.o. female presented to [facility] ED today for abdominal pain. Patient reports the acute onset of RLQ sharp abdominal pain when she woke up this morning. This has been constant. She has gotten no relief from this. No associated fever, chills, nausea, emesis, urinary changes, or bowel changes. No history of similar in the past. Previous abdominal surgeries positive for c-section x2, laparotomy for perforated IUD, and subsequent hysterectomy. Work up in the ED revealed reassuring laboratory findings. CT Abdomen/Pelvis was concerning for acute uncomplicated appendicitis.  General surgery is consulted by emergency medicine physician Dr Blake Divine, MD for evaluation and management of acute uncomplicated appendicitis.       PAST MEDICAL HISTORY (PMH):  Past Medical History:  Diagnosis Date   Anemia    Breast cancer ([place])    Complication of anesthesia    nausea   Gestational diabetes    Kidney infection    Other abnormal glucose    Gestational diabetes with both children   Personal history of radiation therapy    Pneumonia    01-2019   PONV (postoperative nausea and vomiting)     Reviewed. Otherwise negative.   PAST SURGICAL HISTORY ([place]):  Past Surgical History:  Procedure Laterality Date   BREAST LUMPECTOMY     BREAST LUMPECTOMY WITH RADIOACTIVE SEED AND SENTINEL LYMPH NODE BIOPSY Left 11/29/2020   Procedure: LEFT BREAST LUMPECTOMY WITH RADIOACTIVE SEED AND SENTINEL LYMPH NODE BIOPSY;  Surgeon: Jovita Kussmaul, MD;  Location: [place];  Service: General;  Laterality: Left;   CESAREAN SECTION     X2   IUD inserted in [place]  2009   remove perforated IUD   ROBOTIC ASSISTED LAPAROSCOPIC LYSIS OF ADHESION N/A 04/26/2019   Procedure: XI ROBOTIC ASSISTED LAPAROSCOPIC LYSIS OF ADHESION;  Surgeon: [name], MD;  Location: [place];  Service: Gynecology;  Laterality: N/A;   ROBOTIC ASSISTED TOTAL HYSTERECTOMY WITH BILATERAL SALPINGO OOPHERECTOMY Bilateral 04/26/2019   Procedure: XI ROBOTIC ASSISTED TOTAL LAPAROSCOPIC HYSTERECTOMY WITH BILATERAL SALPINGECTOMY;  Surgeon: [name], MD;  Location: [place];  Service: Gynecology;  Laterality: Bilateral;  requests 8:30 OR start in [place] block time requests 2 hours   WISDOM TOOTH EXTRACTION      Reviewed. Otherwise negative.   MEDICATIONS:  Prior to Admission medications   Medication Sig Start Date End Date Taking? Authorizing Provider  acetaminophen (TYLENOL) 500 MG tablet Take 500 mg by mouth every 6 (six) hours as needed.    [provider]  BLACK ELDERBERRY,BERRY-FLOWER, PO Take 1 capsule by mouth daily.    [provider]  Charcoal Activated (ACTIVATED CHARCOAL) POWD by Does not apply route as needed.    [provider]  gabapentin (NEURONTIN) 100 MG capsule Take 100 mg by mouth 3 (three) times daily. 04/04/21   [provider]  ibuprofen (ADVIL) 400 MG tablet Take 400 mg by mouth every 6 (six) hours as needed.    [provider]  levofloxacin (LEVAQUIN) 750 MG tablet Take 1 tablet (750 mg total) by mouth daily. 06/18/21   Nicholas Lose, MD  Medium Chain Triglycerides (MCT OIL PO) Take by mouth daily.    [provider]  methylPREDNISolone (MEDROL DOSEPAK) 4 MG TBPK tablet Take according to the package insert. 06/25/21   Margarette Canada, NP  Multiple Vitamins-Minerals (MULTIVITAMIN WITH MINERALS) tablet Take 2 tablets  by mouth daily.    [provider]  Probiotic Product (PROBIOTIC PO) Take 1 tablet by mouth daily.    [provider]  Theanine 200 MG CAPS Take by mouth.    [provider]  vitamin C (ASCORBIC ACID) 250 MG tablet Take 250 mg by mouth daily.    [provider]  vitamin E 1000 UNIT capsule Take 1,000 Units by mouth daily.    [provider]  ferrous sulfate 325 (65 FE) MG tablet Take 325 mg by mouth daily with breakfast.  10/16/19  [provider]     ALLERGIES:  Allergies  Allergen Reactions   Nickel Swelling   Other     Sensitive to gluten and dairy   Tramadol Nausea Only and Swelling   Oxycodone-Acetaminophen Itching   Tamoxifen Itching and Rash     SOCIAL HISTORY:  Social History   Socioeconomic History   Marital status: Married    Spouse name: Not on file   Number of children: Not on file   Years of education: Not on file   Highest education level: Not on file  Occupational History   Not on file  Tobacco Use   Smoking status: Never   Smokeless tobacco: Never  Vaping Use   Vaping Use: Never used  Substance and Sexual Activity   Alcohol use: Yes    Comment: occas   Drug use: No   Sexual activity: Yes    Birth control/protection: Surgical    Comment: Hyst-INTERCOURSE AGE 49, LESS THA N 5 SEXUAL PARTNERS  Other Topics Concern   Not on file  Social History Narrative   Not on file   Social Determinants of Health   Financial Resource Strain: Not on file  Food Insecurity: Not on file  Transportation Needs: Not on file  Physical Activity: Not on file  Stress: Not on file  Social Connections: Not on file  Intimate Partner Violence: Not on file     FAMILY HISTORY:  Family History  Problem Relation Age of Onset   Diabetes Mother    Thyroid disease Father    Diabetes Father    Thyroid disease Sister    Thyroid cancer Maternal Aunt        papillary; dx 31s   Brain cancer Maternal Uncle 2       gliolblastoma   Rectal cancer Paternal Uncle        mets; dx 60s   Cancer Paternal Uncle        unknown type; dx 65s   Thyroid cancer Maternal Grandmother        follicular   Prostate cancer Maternal Grandfather        dx 32s   Melanoma Paternal Grandmother        mets; dx 5s    Otherwise negative.   REVIEW OF SYSTEMS:  Review of Systems  Constitutional:  Negative for  chills and fever.  HENT:  Negative for congestion and sore throat.   Respiratory:  Negative for cough and shortness of breath.   Cardiovascular:  Negative for chest pain and palpitations.  Gastrointestinal:  Positive for abdominal pain. Negative for diarrhea, nausea and vomiting.  Genitourinary:  Negative for dysuria and urgency.  All other systems reviewed and are negative.   VITAL SIGNS:  Temp:  [98.4 F (36.9 C)] 98.4 F (36.9 C) (06/20 0843) Pulse Rate:  [71] 71 (06/20 0843) Resp:  [16] 16 (06/20 0843) BP: (134)/(91) 134/91 (06/20 0843) SpO2:  [99 %]  99 % (06/20 0843) Weight:  [95.3 kg] 95.3 kg (06/20 0841)     Height: 5' 9.5" (176.5 cm) Weight: 95.3 kg BMI (Calculated): 30.58   PHYSICAL EXAM:  Physical Exam Vitals and nursing note reviewed. Exam conducted with a chaperone present.  Constitutional:      General: She is not in acute distress.    Appearance: She is well-developed. She is not ill-appearing.     Comments: Patient in NAD, standing on arrival, ambulates to bed without issues. Husband at bedside   HENT:     Head: Normocephalic and atraumatic.  Eyes:     General: No scleral icterus.    Extraocular Movements: Extraocular movements intact.  Cardiovascular:     Rate and Rhythm: Normal rate and regular rhythm.  Pulmonary:     Effort: Pulmonary effort is normal. No respiratory distress.  Abdominal:     General: A surgical scar is present. There is no distension.     Palpations: Abdomen is soft.     Tenderness: There is abdominal tenderness in the right lower quadrant. There is no guarding or rebound.  Genitourinary:    Comments: Deferred Skin:    General: Skin is warm and dry.     Coloration: Skin is not jaundiced.     Findings: No erythema.  Neurological:     General: No focal deficit present.     Mental Status: She is alert and oriented to person, place, and time.  Psychiatric:        Mood and Affect: Mood normal.        Behavior: Behavior normal.      INTAKE/OUTPUT:  This shift: No intake/output data recorded.  Last 2 shifts: '@IOLAST2SHIFTS'$ @  Labs:     Latest Ref Rng & Units 07/08/2021    8:44 AM 02/18/2021    2:22 PM 10/30/2020   12:11 PM  CBC  WBC 4.0 - 10.5 K/uL 6.8  5.8  8.9   Hemoglobin 12.0 - 15.0 g/dL 13.3  12.3  13.1   Hematocrit 36.0 - 46.0 % 40.6  36.2  38.6   Platelets 150 - 400 K/uL 272  262  291       Latest Ref Rng & Units 07/08/2021    8:44 AM 02/18/2021    2:22 PM 10/30/2020   12:11 PM  CMP  Glucose 70 - 99 mg/dL 104  89  95   BUN 6 - 20 mg/dL '13  10  12   '$ Creatinine 0.44 - 1.00 mg/dL 0.64  0.70  0.67   Sodium 135 - 145 mmol/L 141  139  142   Potassium 3.5 - 5.1 mmol/L 4.0  3.6  4.0   Chloride 98 - 111 mmol/L 106  105  110   CO2 22 - 32 mmol/L '27  28  25   '$ Calcium 8.9 - 10.3 mg/dL 9.4  9.4  9.8   Total Protein 6.5 - 8.1 g/dL 7.3  7.2  7.6   Total Bilirubin 0.3 - 1.2 mg/dL 0.9  0.3  0.4   Alkaline Phos 38 - 126 U/L 60  47  46   AST 15 - 41 U/L '15  16  17   '$ ALT 0 - 44 U/L '17  14  19     '$ Imaging studies:   CT Abdomen/Pelvis (07/08/2021) personally reviewed showing dilated and inflamed appendix consistent with appendicitis, no evidence of perforation or abscess, and radiologist report reviewed below:  IMPRESSION: Dilated, elongated fluid-filled appendix with subtle mural hyperenhancement and  periappendiceal edema. Imaging features are compatible with acute appendicitis. No perforation or abscess by CT.   Assessment/Plan: (ICD-10's: K59.80) 41 y.o. female with RLQ abdominal pain found to have acute uncomplicated appendicitis    - Will admit to general surgery - Plan for laparoscopic appendectomy this afternoon with Dr Dahlia Byes pending OR/Anesthesia availability - All risks, benefits, and alternatives to above procedure(s) were discussed with the patient and her family, all of their questions were answered to their expressed satisfaction, patient expresses she wishes to proceed, and informed consent was  obtained.    - NPO + IVF Resuscitation  - IV Abx (Zosyn) - Monitor abdominal examination - Pain control prn; antiemetics prn    - DVT prophylaxis; hold for OR  All of the above findings and recommendations were discussed with the patient and her family, and all of their questions were answered to their expressed satisfaction.  -- Edison Simon, PA-C [facility] 07/08/2021, 10:48 AM M-F: 7am - 4pm

## 2021-07-08 NOTE — Op Note (Signed)
laparascopic appendectomy   Gabriela Evans Date of operation:  07/08/2021  Indications: The patient presented with a history of  abdominal pain. Workup has revealed findings consistent with acute appendicitis.  Pre-operative Diagnosis: Acute appendicitis without mention of peritonitis  Post-operative Diagnosis: Same  Surgeon: Caroleen Hamman, MD, FACS  Anesthesia: General with endotracheal tube  Findings: Acute non perforated for appendicitis  Estimated Blood Loss: 5cc         Specimens: appendix         Complications:  none  Procedure Details  The patient was seen again in the preop area. The options of surgery versus observation were reviewed with the patient and/or family. The risks of bleeding, infection, recurrence of symptoms, negative laparoscopy, potential for an open procedure, bowel injury, abscess or infection, were all reviewed as well. The patient was taken to Operating Room, identified as Gabriela Evans and the procedure verified as laparoscopic appendectomy. A Time Out was held and the above information confirmed.  The patient was placed in the supine position and general anesthesia was induced.  Antibiotic prophylaxis was administered and VT E prophylaxis was in place.  The abdomen was prepped and draped in a sterile fashion. An infraumbilical incision was made. A cutdown technique was used to enter the abdominal cavity. Two vicryl stitches were placed on the fascia and a Hasson trocar inserted. Pneumoperitoneum obtained. Two 5 mm ports were placed under direct visualization.   The appendix was identified and found to be acutely inflamed  The appendix was carefully dissected. The mesoappendix was divided withHarmonic scalpel. The base of the appendix was dissected out and divided with a standard load Endo GIA.The appendix was placed in a Endo Catch bag and removed via the Hasson port. The right lower quadrant and pelvis was then irrigated with  normal saline which was  aspirated. Inspection  failed to identify any additional bleeding and there were no signs of bowel injury. Again the right lower quadrant was inspected there was no sign of bleeding or bowel injury therefore pneumoperitoneum was released, all ports were removed.  The umbilical fascia was closed with 0 Vicryl interrupted sutures and the skin incisions were approximated with subcuticular 4-0 Monocryl. Dermabond was placed The patient tolerated the procedure well, there were no complications. The sponge lap and needle count were correct at the end of the procedure.  The patient was taken to the recovery room in stable condition to be admitted for continued care.    Caroleen Hamman, MD FACS

## 2021-07-08 NOTE — Progress Notes (Signed)
Admission profile udpated ?

## 2021-07-08 NOTE — Transfer of Care (Addendum)
Immediate Anesthesia Transfer of Care Note  Patient: Gabriela Evans  Procedure(s) Performed: APPENDECTOMY LAPAROSCOPIC  Patient Location: PACU  Anesthesia Type:General  Level of Consciousness: drowsy  Airway & Oxygen Therapy: Patient Spontanous Breathing and Patient connected to face mask  Post-op Assessment: Report given to RN and Post -op Vital signs reviewed and stable  Post vital signs: Reviewed and stable  Last Vitals:  Vitals Value Taken Time  BP 101/48 07/08/21 1602  Temp    Pulse 64 07/08/21 1605  Resp 20 07/08/21 1605  SpO2 100 % 07/08/21 1605  Vitals shown include unvalidated device data.  Last Pain:  Vitals:   07/08/21 1235  TempSrc: Temporal  PainSc: 0-No pain      Patients Stated Pain Goal: 0 (77/93/90 3009)  Complications: No notable events documented.

## 2021-07-08 NOTE — ED Provider Notes (Signed)
[facility] Provider Note    Event Date/Time   First MD Initiated Contact with Patient 07/08/21 [phone]     (approximate)   History   Chief Complaint Abdominal Pain   HPI  Gabriela Evans is a 41 y.o. female with past medical history of anemia and breast cancer who presents to the ED complaining of abdominal pain.  Patient reports that she woke up this morning with sharp pain in the right lower quadrant of her abdomen.  Pain has been constant since then and she states that area of her abdomen is very sore to touch.  She denies any associated fevers, nausea, vomiting, diarrhea, dysuria, hematuria, flank pain, vaginal bleeding, or discharge.  She is status post partial hysterectomy, reports multiple C-sections but no other abdominal surgical history.  She has not taken anything for her symptoms prior to arrival.     Physical Exam   Triage Vital Signs: ED Triage Vitals  Enc Vitals Group     BP 07/08/21 0843 (!) 134/91     Pulse Rate 07/08/21 0843 71     Resp 07/08/21 0843 16     Temp 07/08/21 0843 98.4 F (36.9 C)     Temp Source 07/08/21 0843 Oral     SpO2 07/08/21 0843 99 %     Weight 07/08/21 0841 210 lb (95.3 kg)     Height 07/08/21 0841 5' 9.5" (1.765 m)     Head Circumference --      Peak Flow --      Pain Score 07/08/21 0841 7     Pain Loc --      Pain Edu? --      Excl. in [place]? --     Most recent vital signs: Vitals:   07/08/21 0843  BP: (!) 134/91  Pulse: 71  Resp: 16  Temp: 98.4 F (36.9 C)  SpO2: 99%    Constitutional: Alert and oriented. Eyes: Conjunctivae are normal. Head: Atraumatic. Nose: No congestion/rhinnorhea. Mouth/Throat: Mucous membranes are moist.  Cardiovascular: Normal rate, regular rhythm. Grossly normal heart sounds.  2+ radial pulses bilaterally. Respiratory: Normal respiratory effort.  No retractions. Lungs CTAB. Gastrointestinal: Soft and tender to palpation in the right lower quadrant with no rebound or  guarding.  No distention. Musculoskeletal: No lower extremity tenderness nor edema.  Neurologic:  Normal speech and language. No gross focal neurologic deficits are appreciated.    ED Results / Procedures / Treatments   Labs (all labs ordered are listed, but only abnormal results are displayed) Labs Reviewed  COMPREHENSIVE METABOLIC PANEL - Abnormal; Notable for the following components:      Result Value   Glucose, Bld 104 (*)    All other components within normal limits  URINALYSIS, ROUTINE W REFLEX MICROSCOPIC - Abnormal; Notable for the following components:   Color, Urine YELLOW (*)    APPearance HAZY (*)    All other components within normal limits  LIPASE, BLOOD  CBC  POC URINE PREG, ED   RADIOLOGY CT abdomen/pelvis reviewed and interpreted by me with inflammatory changes in the right lower quadrant concerning for appendicitis.  PROCEDURES:  Critical Care performed: No  Procedures   MEDICATIONS ORDERED IN ED: Medications  piperacillin-tazobactam (ZOSYN) IVPB 3.375 g (has no administration in time range)  lactated ringers bolus 1,000 mL (1,000 mLs Intravenous New Bag/Given 07/08/21 0933)  iohexol (OMNIPAQUE) 300 MG/ML solution 100 mL (100 mLs Intravenous Contrast Given 07/08/21 1015)     IMPRESSION / MDM /  ASSESSMENT AND PLAN / ED COURSE  I reviewed the triage vital signs and the nursing notes.                              41 y.o. female with past medical history of anemia and breast cancer presents to the ED with sharp pain in the right lower quadrant of her abdomen ever since she woke up this morning.  Patient's presentation is most consistent with acute presentation with potential threat to life or bodily function.  Differential diagnosis includes, but is not limited to, appendicitis, kidney stone, UTI, ovarian cyst, endometriosis, ovarian torsion.  Patient well-appearing and in no acute distress, vital signs are unremarkable but she has focal tenderness in  the right lower quadrant of her abdomen.  Labs thus far are reassuring with no significant leukocytosis or anemia, LFTs and electrolytes are within normal limits.  Pregnancy testing is negative and urinalysis shows no signs of infection.  We will further assess with CT scan for appendicitis, ovarian torsion considered less likely given patient with minimal pain without palpation.  She declines pain medication and we will reassess following CT imaging.  CT scan is concerning for acute uncomplicated appendicitis and we will treat with dose of IV Zosyn.  Case discussed with Dr. Dahlia Byes of general surgery, who will evaluate the patient and plan for admission.  Patient continues to decline pain medication at this time.      FINAL CLINICAL IMPRESSION(S) / ED DIAGNOSES   Final diagnoses:  Acute appendicitis with localized peritonitis, without perforation, abscess, or gangrene     Rx / DC Orders   ED Discharge Orders     None        Note:  This document was prepared using Dragon voice recognition software and may include unintentional dictation errors.   Blake Divine, MD 07/08/21 1045

## 2021-07-09 ENCOUNTER — Encounter: Payer: Self-pay | Admitting: Surgery

## 2021-07-10 LAB — SURGICAL PATHOLOGY

## 2021-07-14 ENCOUNTER — Inpatient Hospital Stay: Attending: Hematology and Oncology | Admitting: Adult Health

## 2021-07-14 ENCOUNTER — Encounter: Payer: Self-pay | Admitting: Adult Health

## 2021-07-14 DIAGNOSIS — Z809 Family history of malignant neoplasm, unspecified: Secondary | ICD-10-CM | POA: Diagnosis not present

## 2021-07-14 DIAGNOSIS — Z170 Estrogen receptor positive status [ER+]: Secondary | ICD-10-CM | POA: Insufficient documentation

## 2021-07-14 DIAGNOSIS — Z923 Personal history of irradiation: Secondary | ICD-10-CM | POA: Insufficient documentation

## 2021-07-14 DIAGNOSIS — Z79810 Long term (current) use of selective estrogen receptor modulators (SERMs): Secondary | ICD-10-CM | POA: Diagnosis not present

## 2021-07-14 DIAGNOSIS — Z808 Family history of malignant neoplasm of other organs or systems: Secondary | ICD-10-CM | POA: Insufficient documentation

## 2021-07-14 DIAGNOSIS — C50412 Malignant neoplasm of upper-outer quadrant of left female breast: Secondary | ICD-10-CM | POA: Diagnosis present

## 2021-07-14 DIAGNOSIS — Z8042 Family history of malignant neoplasm of prostate: Secondary | ICD-10-CM | POA: Insufficient documentation

## 2021-07-15 ENCOUNTER — Encounter: Payer: Self-pay | Admitting: Rehabilitation

## 2021-07-15 ENCOUNTER — Ambulatory Visit: Admitting: Rehabilitation

## 2021-07-15 DIAGNOSIS — R600 Localized edema: Secondary | ICD-10-CM

## 2021-07-15 DIAGNOSIS — C50412 Malignant neoplasm of upper-outer quadrant of left female breast: Secondary | ICD-10-CM | POA: Diagnosis not present

## 2021-07-15 DIAGNOSIS — M542 Cervicalgia: Secondary | ICD-10-CM

## 2021-07-15 DIAGNOSIS — R293 Abnormal posture: Secondary | ICD-10-CM

## 2021-07-15 NOTE — Therapy (Signed)
OUTPATIENT PHYSICAL THERAPY ONCOLOGY EVALUATION  Patient Name: Gabriela Evans MRN: [id number] DOB:November 09, 1980, 41 y.o., female Today's Date: 07/15/2021   PT End of Session - 07/15/21 0901     Visit Number 3    Number of Visits 9    Date for PT Re-Evaluation 07/15/21    PT Start Time 0902    PT Stop Time 0945    PT Time Calculation (min) 43 min    Activity Tolerance Patient tolerated treatment well    Behavior During Therapy [facility] for tasks assessed/performed              Past Medical History:  Diagnosis Date   Anemia    Breast cancer (HCC)    Complication of anesthesia    nausea   Gestational diabetes    Kidney infection    Other abnormal glucose    Gestational diabetes with both children   Personal history of radiation therapy    Pneumonia    01-2019   PONV (postoperative nausea and vomiting)    Past Surgical History:  Procedure Laterality Date   BREAST LUMPECTOMY     BREAST LUMPECTOMY WITH RADIOACTIVE SEED AND SENTINEL LYMPH NODE BIOPSY Left 11/29/2020   Procedure: LEFT BREAST LUMPECTOMY WITH RADIOACTIVE SEED AND SENTINEL LYMPH NODE BIOPSY;  Surgeon: Griselda Miner, MD;  Location: [facility];  Service: General;  Laterality: Left;   CESAREAN SECTION     X2   IUD inserted in OR     LAPAROSCOPIC APPENDECTOMY N/A 07/08/2021   Procedure: APPENDECTOMY LAPAROSCOPIC;  Surgeon: Leafy Ro, MD;  Location: [facility];  Service: General;  Laterality: N/A;   PELVIC LAPAROSCOPY  2009   remove perforated IUD   ROBOTIC ASSISTED LAPAROSCOPIC LYSIS OF ADHESION N/A 04/26/2019   Procedure: XI ROBOTIC ASSISTED LAPAROSCOPIC LYSIS OF ADHESION;  Surgeon: Genia Del, MD;  Location: [facility];  Service: Gynecology;  Laterality: N/A;   ROBOTIC ASSISTED TOTAL HYSTERECTOMY WITH BILATERAL SALPINGO OOPHERECTOMY Bilateral 04/26/2019   Procedure: XI ROBOTIC ASSISTED TOTAL LAPAROSCOPIC HYSTERECTOMY WITH BILATERAL SALPINGECTOMY;  Surgeon: Genia Del,  MD;  Location: [facility];  Service: Gynecology;  Laterality: Bilateral;  requests 8:30 OR start in [place] [facility] block time requests 2 hours   WISDOM TOOTH EXTRACTION     Patient Active Problem List   Diagnosis Date Noted   Acute appendicitis 07/08/2021   Genetic testing 11/07/2020   Malignant neoplasm of upper-outer quadrant of left breast in female, estrogen receptor positive (HCC) 10/28/2020   Postoperative state 04/26/2019   Menorrhagia 03/21/2018    PCP: None  REFERRING PROVIDER: Dr Luanna Cole DIAG: C50.412,Z17.0 (ICD-10-CM) - Malignant neoplasm of upper-outer quadrant of left breast in female, estrogen receptor positive (HCC)  THERAPY DIAG:  Malignant neoplasm of upper-outer quadrant of left breast in female, estrogen receptor positive (HCC)  Cervicalgia  Abnormal posture  Localized edema  ONSET DATE: 11/29/20  SUBJECTIVE  SUBJECTIVE STATEMENT:  I had to get my appendix out.  I am healing well.  I did my flexitouch trial.  I can see how it would maybe work.   PERTINENT HISTORY: Lt lumpectomy with 2 negative lymph nodes removed on 11/29/20.  She underwent a partial hysterectomy (ovaries still remain) in 04/2019. Radiation completed. Hx of antibiotics for possible breast infection.   PAIN:  Are you having pain? No I feel tender to the touch in all the swollen places and my shoulder blade and breast 2/10  PRECAUTIONS: Left lymphedema precautions  WEIGHT BEARING RESTRICTIONS No  FALLS:  Has patient fallen in last 6 months? 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  LEISURE: limited due to left UE  HAND DOMINANCE : right    PRIOR LEVEL OF FUNCTION: Independent  PATIENT GOALS focusing on the breast size and swelling   OBJECTIVE PALPATION: Fibrosis mild under medial skin changes, moderate fibrosis lateral inferior breast more noticeable in sidelying, tenderness to latissimus with minimal skin excursion here  A/PROM 10/30/20 LEFT 12/16/20   Shoulder extension 52 60   Shoulder flexion 149 155   Shoulder abduction 162 135   Shoulder internal rotation     Shoulder external rotation 87 90     (Blank rows = not tested)  CERVICAL AROM: All within normal limits:   12/16/20   Flexion 45 pull   Extension 50 tightness   Right lateral flexion    Left lateral flexion    Right rotation 60 tension Rt suboccipitals   Left rotation 45 tension Lt suboccipitals    L-DEX LYMPHEDEMA SCREENING: WNL  TODAY'S TREATMENT  Pt permission and consent throughout each step of examination and treatment with modification and draping if requested when working on sensitive areas  07/15/21 Performed with pt permission supine: clavicular and sternal nodes, bil axillary nodes, left inguinal nodes avoided today due to appendectomy incision, anterior interaxillary pathway, left axillo-inguinal pathway staying more lateral, and then left breast towards the nearest nodes/pathway, and then repeating all steps in reverse.   06/24/21 Pt continues with left breast fibrosis, soft edema, pitting edema, tenderness, and heat.   Performed with pt permission supine: clavicular and sternal nodes, bil axillary nodes, left inguinal nodes, anterior interaxillary pathway, left axillo-inguinal pathway, and then left breast towards the nearest nodes/pathway, and then repeating all steps in reverse. 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  Applied KT 5 strip with bucket at subscapular lymph nodes with strips applied with tape pressure only to left lateral breast.   Gave pt new script for breast swell spot and more bras Sent demo to  flexitouch  06/03/21 Performed with pt permission supine: clavicular and sternal nodes, bil axillary nodes, left inguinal nodes, anterior interaxillary pathway, left axillo-inguinal pathway, and then left breast towards the nearest nodes/pathway, and then repeating all steps in reverse.   Pt had been doing a lymphatic drainage video on youtube that is approved by this PT so she was educated on how to add the breast portion to this video.  Made small foam chip pack for use on fibrosis with instruction foruse  PATIENT EDUCATION:  Education details: breast MLD, POC Person educated: Patient Education method: Explanation, Demonstration, Tactile cues, and Verbal cues Education comprehension: verbalized understanding and returned demonstration   HOME EXERCISE PROGRAM: Self MLD Lt breast Use of foam chip pack for  fibrosis  ASSESSMENT: CLINICAL IMPRESSION: Pt with much improved appearance of the breast today. Not as enlarged appearing more wrinkles and pores evident today which is a sign of less edema.  Fibrosis noted medial breast softened with MT.    OBJECTIVE IMPAIRMENTS decreased activity tolerance, decreased knowledge of use of DME, decreased ROM, and increased edema.   ACTIVITY LIMITATIONS community activity and occupation.   PERSONAL FACTORS Age, Behavior pattern, Past/current experiences, and 1-2 comorbidities: radiation and SLNB  are also affecting patient's functional outcome.    REHAB POTENTIAL: Excellent  CLINICAL DECISION MAKING: Stable/uncomplicated  EVALUATION COMPLEXITY: Low  GOALS: Goals reviewed with patient? Yes   LONG TERM GOALS: Target date: 07/15/2021  (Remove Blue Hyperlink)  Pt will decrease feelings of left upper quadrant edema and tightness by at least 50% Baseline:  Goal status: INITIAL  2.  Pt will be ind with self MLD or use of compression pump Baseline:  Goal status: INITIAL  3.  Pt will be scheduled for continued SOZO surveillance Baseline:  Goal  status: INITIAL  4.  Pt will be ind with final HEP Baseline:  Goal status: INITIAL PLAN: PT FREQUENCY: 1x/week  PT DURATION: 6 weeks  PLANNED INTERVENTIONS: Therapeutic exercises, Patient/Family education, Joint mobilization, DME instructions, Moist heat, Manual lymph drainage, and Manual therapy  PLAN FOR NEXT SESSION: don't forget to schedule out SOZO, how was foam?, Left breast MLD, gentle STM lat and scapular region   Idamae Lusher, PT 07/15/2021, 9:52 AM

## 2021-07-20 ENCOUNTER — Encounter: Payer: Self-pay | Admitting: Physical Therapy

## 2021-07-24 ENCOUNTER — Ambulatory Visit (INDEPENDENT_AMBULATORY_CARE_PROVIDER_SITE_OTHER): Payer: Managed Care, Other (non HMO) | Admitting: Physician Assistant

## 2021-07-24 ENCOUNTER — Encounter: Payer: Self-pay | Admitting: Physician Assistant

## 2021-07-24 ENCOUNTER — Ambulatory Visit: Attending: Hematology and Oncology

## 2021-07-24 VITALS — BP 111/77 | HR 73 | Temp 98.3°F | Ht 69.5 in | Wt 211.2 lb

## 2021-07-24 DIAGNOSIS — R293 Abnormal posture: Secondary | ICD-10-CM | POA: Insufficient documentation

## 2021-07-24 DIAGNOSIS — Z09 Encounter for follow-up examination after completed treatment for conditions other than malignant neoplasm: Secondary | ICD-10-CM

## 2021-07-24 DIAGNOSIS — K3580 Unspecified acute appendicitis: Secondary | ICD-10-CM

## 2021-07-24 DIAGNOSIS — M542 Cervicalgia: Secondary | ICD-10-CM | POA: Insufficient documentation

## 2021-07-24 DIAGNOSIS — R600 Localized edema: Secondary | ICD-10-CM | POA: Insufficient documentation

## 2021-07-24 DIAGNOSIS — C50412 Malignant neoplasm of upper-outer quadrant of left female breast: Secondary | ICD-10-CM | POA: Insufficient documentation

## 2021-07-24 DIAGNOSIS — K353 Acute appendicitis with localized peritonitis, without perforation or gangrene: Secondary | ICD-10-CM

## 2021-07-24 DIAGNOSIS — Z170 Estrogen receptor positive status [ER+]: Secondary | ICD-10-CM | POA: Diagnosis present

## 2021-07-24 NOTE — Progress Notes (Signed)
[facility] POST-OP OFFICE VISIT  07/24/2021  HPI: Gabriela Evans is a 41 y.o. female 16 days s/p laparoscopic appendectomy for acute appendicitis with Dr Dahlia Byes  Doing well  Some soreness at umbilicus She did have some erythema around the umbilicus but thinks this was related to bumping in on a table No fever, chills, nausea, emesis No other complaints this afternoon  Vital signs: BP 111/77   Pulse 73   Temp 98.3 F (36.8 C) (Oral)   Ht 5' 9.5" (1.765 m)   Wt 211 lb 3.2 oz (95.8 kg)   LMP 04/18/2019   SpO2 98%   BMI 30.74 kg/m    Physical Exam: Constitutional: Well appearing female, NAD Abdomen: Soft, non-tender, non-distended, no rebound/guarding Skin: Laparoscopic incisions are healing well, no significant erythema or drainage   Assessment/Plan: This is a 41 y.o. female 16 days s/p laparoscopic appendectomy for acute appendicitis    - Pain control prn  - Reviewed wound care recommendation  - Reviewed lifting restrictions; 4 weeks total  - Reviewed surgical pathology; Appendicitis   - She can follow up on as needed basis; She understands to call with questions/concerns  -- Edison Simon, PA-C [facility] 07/24/2021, 2:01 PM M-F: 7am - 4pm

## 2021-07-24 NOTE — Therapy (Signed)
OUTPATIENT PHYSICAL THERAPY ONCOLOGY EVALUATION  Patient Name: Gabriela Evans MRN: [id number] DOB:[birth year], 41 y.o., female Today's Date: 07/24/2021   PT End of Session - 07/24/21 0904     Visit Number 4    Number of Visits 9    Date for PT Re-Evaluation 07/15/21    PT Start Time 0901    PT Stop Time 0958    PT Time Calculation (min) 57 min    Activity Tolerance Patient tolerated treatment well    Behavior During Therapy [facility] for tasks assessed/performed              Past Medical History:  Diagnosis Date   Anemia    Breast cancer ([place])    Complication of anesthesia    nausea   Gestational diabetes    Kidney infection    Other abnormal glucose    Gestational diabetes with both children   Personal history of radiation therapy    Pneumonia    01-2019   PONV (postoperative nausea and vomiting)    Past Surgical History:  Procedure Laterality Date   BREAST LUMPECTOMY     BREAST LUMPECTOMY WITH RADIOACTIVE SEED AND SENTINEL LYMPH NODE BIOPSY Left 11/29/2020   Procedure: LEFT BREAST LUMPECTOMY WITH RADIOACTIVE SEED AND SENTINEL LYMPH NODE BIOPSY;  Surgeon: Jovita Kussmaul, MD;  Location: [place];  Service: General;  Laterality: Left;   CESAREAN SECTION     X2   IUD inserted in [place] N/A 07/08/2021   Procedure: APPENDECTOMY LAPAROSCOPIC;  Surgeon: Jules Husbands, MD;  Location: [facility];  Service: General;  Laterality: N/A;   PELVIC LAPAROSCOPY  2009   remove perforated IUD   ROBOTIC ASSISTED LAPAROSCOPIC LYSIS OF ADHESION N/A 04/26/2019   Procedure: XI ROBOTIC ASSISTED LAPAROSCOPIC LYSIS OF ADHESION;  Surgeon: [name], MD;  Location: [place];  Service: Gynecology;  Laterality: N/A;   ROBOTIC ASSISTED TOTAL HYSTERECTOMY WITH BILATERAL SALPINGO OOPHERECTOMY Bilateral 04/26/2019   Procedure: XI ROBOTIC ASSISTED TOTAL LAPAROSCOPIC HYSTERECTOMY WITH BILATERAL SALPINGECTOMY;  Surgeon: [name],  MD;  Location: [place];  Service: Gynecology;  Laterality: Bilateral;  requests 8:30 OR start in [place] block time requests 2 hours   WISDOM TOOTH EXTRACTION     Patient Active Problem List   Diagnosis Date Noted   Acute appendicitis 07/08/2021   Genetic testing 11/07/2020   Malignant neoplasm of upper-outer quadrant of left breast in female, estrogen receptor positive ([place]) 10/28/2020   Postoperative state 04/26/2019   Menorrhagia 03/21/2018    PCP: None  REFERRING PROVIDER: Dr Jerilynn Birkenhead DIAG: C50.412,Z17.0 (ICD-10-CM) - Malignant neoplasm of upper-outer quadrant of left breast in female, estrogen receptor positive ([place])  THERAPY DIAG:  Malignant neoplasm of upper-outer quadrant of left breast in female, estrogen receptor positive ([place])  Cervicalgia  Abnormal posture  Localized edema  ONSET DATE: 11/29/20  SUBJECTIVE  SUBJECTIVE STATEMENT:  I got my Flexitouch and I'm trying to figure out how to make it best work for the areas of fibrosis and with the foam I think I've got it working better for me now.   PERTINENT HISTORY: Lt lumpectomy with 2 negative lymph nodes removed on 11/29/20.  She underwent a partial hysterectomy (ovaries still remain) in 04/2019. Radiation completed. Hx of antibiotics for possible breast infection.   PAIN:  Are you having pain? No I feel tender to the touch in all the swollen places and my shoulder blade and breast 2/10  PRECAUTIONS: Left lymphedema precautions  WEIGHT BEARING RESTRICTIONS No  FALLS:  Has patient fallen in last 6 months? 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  LEISURE:  limited due to left UE  HAND DOMINANCE : right   PRIOR LEVEL OF FUNCTION: Independent  PATIENT GOALS focusing on the breast size and swelling   OBJECTIVE PALPATION: Fibrosis mild under medial skin changes, moderate fibrosis lateral inferior breast more noticeable in sidelying, tenderness to latissimus with minimal skin excursion here  A/PROM 10/30/20 LEFT 12/16/20   Shoulder extension 52 60   Shoulder flexion 149 155   Shoulder abduction 162 135   Shoulder internal rotation     Shoulder external rotation 87 90     (Blank rows = not tested)  CERVICAL AROM: All within normal limits:   12/16/20   Flexion 45 pull   Extension 50 tightness   Right lateral flexion    Left lateral flexion    Right rotation 60 tension Rt suboccipitals   Left rotation 45 tension Lt suboccipitals    L-DEX LYMPHEDEMA SCREENING: WNL  TODAY'S TREATMENT  Pt permission and consent throughout each step of examination and treatment with modification and draping if requested when working on sensitive areas  07/23/21 MLD: Short neck and sternal nodes, 5 diaphragmatic breaths, Rt axillary nodes, left inguinal nodes, anterior inter-axillary pathway, left axillo-inguinal pathway, and then left breast towards the nearest nodes/pathway, then work in [place] posterior interaxilllary and Lt axillo-inguinal anastomosis, then finished retracing all steps in supine MFR: During MLD to Lt axilla in end range flexion where tightness palpable  07/15/21 Performed with pt permission supine: clavicular and sternal nodes, bil axillary nodes, left inguinal nodes avoided today due to appendectomy incision, anterior interaxillary pathway, left axillo-inguinal pathway staying more lateral, and then left breast towards the nearest nodes/pathway, and then repeating all steps in reverse.   06/24/21 Pt continues with left breast fibrosis, soft edema, pitting edema, tenderness, and heat.   Performed with pt permission supine:  clavicular and sternal nodes, bil axillary nodes, left inguinal nodes, anterior interaxillary pathway, left axillo-inguinal pathway, and then left breast towards the nearest nodes/pathway, and then repeating all steps in reverse. 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  Applied KT 5 strip with bucket at subscapular lymph nodes with strips applied with tape pressure only to left lateral breast.   Gave pt new script for breast swell spot and more bras Sent demo to flexitouch  06/03/21 Performed with pt permission supine: clavicular and sternal nodes, bil axillary nodes, left inguinal nodes, anterior interaxillary pathway, left axillo-inguinal pathway, and then left breast towards the nearest nodes/pathway, and then repeating all steps in reverse.   Pt had been doing a lymphatic drainage video on youtube that is approved by this PT  so she was educated on how to add the breast portion to this video.  Made small foam chip pack for use on fibrosis with instruction foruse  PATIENT EDUCATION:  Education details: breast MLD, POC Person educated: Patient Education method: Explanation, Demonstration, Tactile cues, and Verbal cues Education comprehension: verbalized understanding and returned demonstration   HOME EXERCISE PROGRAM: Self MLD Lt breast Use of foam chip pack for fibrosis  ASSESSMENT: CLINICAL IMPRESSION: Pt is doing well with managing with her Flexitouch at home. Continued with MLD to Lt breast and area of fibrosis softened well. Also included MFR to LT axilla at area of tightness which lessened some by end of session. Educated pt to try anastomosis work before [employer] and work on area of fibrosis a few mins after session to see if this will further her results. She verbalize good understanding.   OBJECTIVE IMPAIRMENTS decreased activity tolerance, decreased knowledge of use of DME, decreased ROM, and increased edema.   ACTIVITY LIMITATIONS community activity and  occupation.   PERSONAL FACTORS Age, Behavior pattern, Past/current experiences, and 1-2 comorbidities: radiation and SLNB  are also affecting patient's functional outcome.    REHAB POTENTIAL: Excellent  CLINICAL DECISION MAKING: Stable/uncomplicated  EVALUATION COMPLEXITY: Low  GOALS: Goals reviewed with patient? Yes   LONG TERM GOALS: Target date: 07/15/2021  (Remove Blue Hyperlink)  Pt will decrease feelings of left upper quadrant edema and tightness by at least 50% Baseline:  Goal status: INITIAL  2.  Pt will be ind with self MLD or use of compression pump Baseline:  Goal status: INITIAL  3.  Pt will be scheduled for continued SOZO surveillance Baseline:  Goal status: INITIAL  4.  Pt will be ind with final HEP Baseline:  Goal status: INITIAL PLAN: PT FREQUENCY: 1x/week  PT DURATION: 6 weeks  PLANNED INTERVENTIONS: Therapeutic exercises, Patient/Family education, Joint mobilization, DME instructions, Moist heat, Manual lymph drainage, and Manual therapy  PLAN FOR NEXT SESSION: how was foam?, Left breast MLD, gentle STM lat and scapular region   Otelia Limes, PTA 07/24/2021, 10:08 AM

## 2021-07-24 NOTE — Patient Instructions (Signed)

## 2021-07-31 ENCOUNTER — Ambulatory Visit: Admitting: Rehabilitation

## 2021-08-13 ENCOUNTER — Encounter: Payer: Self-pay | Admitting: Rehabilitation

## 2021-08-13 ENCOUNTER — Ambulatory Visit: Admitting: Rehabilitation

## 2021-08-13 DIAGNOSIS — C50412 Malignant neoplasm of upper-outer quadrant of left female breast: Secondary | ICD-10-CM | POA: Diagnosis not present

## 2021-08-13 DIAGNOSIS — Z170 Estrogen receptor positive status [ER+]: Secondary | ICD-10-CM

## 2021-08-13 DIAGNOSIS — M542 Cervicalgia: Secondary | ICD-10-CM

## 2021-08-13 DIAGNOSIS — R600 Localized edema: Secondary | ICD-10-CM

## 2021-08-13 DIAGNOSIS — R293 Abnormal posture: Secondary | ICD-10-CM

## 2021-08-13 NOTE — Therapy (Signed)
OUTPATIENT PHYSICAL THERAPY ONCOLOGY EVALUATION  Patient Name: Gabriela Evans MRN: [id number] DOB:[birth year], 41 y.o., female Today's Date: 08/13/2021   PT End of Session - 08/13/21 1256     Visit Number 5    Number of Visits 9    PT Start Time 1300    PT Stop Time 1346    PT Time Calculation (min) 46 min    Activity Tolerance Patient tolerated treatment well    Behavior During Therapy WFL for tasks assessed/performed              Past Medical History:  Diagnosis Date   Anemia    Breast cancer ([place])    Complication of anesthesia    nausea   Gestational diabetes    Kidney infection    Other abnormal glucose    Gestational diabetes with both children   Personal history of radiation therapy    Pneumonia    01-2019   PONV (postoperative nausea and vomiting)    Past Surgical History:  Procedure Laterality Date   BREAST LUMPECTOMY     BREAST LUMPECTOMY WITH RADIOACTIVE SEED AND SENTINEL LYMPH NODE BIOPSY Left 11/29/2020   Procedure: LEFT BREAST LUMPECTOMY WITH RADIOACTIVE SEED AND SENTINEL LYMPH NODE BIOPSY;  Surgeon: Jovita Kussmaul, MD;  Location: [place];  Service: General;  Laterality: Left;   CESAREAN SECTION     X2   IUD inserted in [place] N/A 07/08/2021   Procedure: APPENDECTOMY LAPAROSCOPIC;  Surgeon: Jules Husbands, MD;  Location: [facility];  Service: General;  Laterality: N/A;   PELVIC LAPAROSCOPY  2009   remove perforated IUD   ROBOTIC ASSISTED LAPAROSCOPIC LYSIS OF ADHESION N/A 04/26/2019   Procedure: XI ROBOTIC ASSISTED LAPAROSCOPIC LYSIS OF ADHESION;  Surgeon: [name], MD;  Location: [place];  Service: Gynecology;  Laterality: N/A;   ROBOTIC ASSISTED TOTAL HYSTERECTOMY WITH BILATERAL SALPINGO OOPHERECTOMY Bilateral 04/26/2019   Procedure: XI ROBOTIC ASSISTED TOTAL LAPAROSCOPIC HYSTERECTOMY WITH BILATERAL SALPINGECTOMY;  Surgeon: [name], MD;  Location: [place];  Service: Gynecology;  Laterality: Bilateral;  requests 8:30 OR start in [place] block time requests 2 hours   WISDOM TOOTH EXTRACTION     Patient Active Problem List   Diagnosis Date Noted   Acute appendicitis 07/08/2021   Genetic testing 11/07/2020   Malignant neoplasm of upper-outer quadrant of left breast in female, estrogen receptor positive ([place]) 10/28/2020   Postoperative state 04/26/2019   Menorrhagia 03/21/2018    PCP: None  REFERRING PROVIDER: Dr Jerilynn Birkenhead DIAG: C50.412,Z17.0 (ICD-10-CM) - Malignant neoplasm of upper-outer quadrant of left breast in female, estrogen receptor positive ([place])  THERAPY DIAG:  Malignant neoplasm of upper-outer quadrant of left breast in female, estrogen receptor positive ([place])  Cervicalgia  Abnormal posture  Localized edema  ONSET DATE: 11/29/20  SUBJECTIVE  SUBJECTIVE STATEMENT:  The flexitouch broke and I got a new one.  I think it is helpful. I am more tight in the back and lat area that is more annoying.    PERTINENT HISTORY: Lt lumpectomy with 2 negative lymph nodes removed on 11/29/20.  She underwent a partial hysterectomy (ovaries still remain) in 04/2019. Radiation completed. Hx of antibiotics for possible breast infection.   PAIN:  Are you having pain? I feel tender to the touch in all the swollen places and my shoulder blade and breast 2/10  PRECAUTIONS: Left lymphedema precautions  WEIGHT BEARING RESTRICTIONS No  FALLS:  Has patient fallen in last 6 months? 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  LEISURE: limited due to left UE  HAND DOMINANCE : right   PRIOR LEVEL OF FUNCTION:  Independent  PATIENT GOALS focusing on the breast size and swelling   OBJECTIVE PALPATION: Fibrosis mild under medial skin changes, moderate fibrosis lateral inferior breast more noticeable in sidelying, tenderness to latissimus with minimal skin excursion here  A/PROM 10/30/20 LEFT 12/16/20   Shoulder extension 52 60   Shoulder flexion 149 155   Shoulder abduction 162 135   Shoulder internal rotation     Shoulder external rotation 87 90     (Blank rows = not tested)  CERVICAL AROM: All within normal limits:   12/16/20   Flexion 45 pull   Extension 50 tightness   Right lateral flexion    Left lateral flexion    Right rotation 60 tension Rt suboccipitals   Left rotation 45 tension Lt suboccipitals    L-DEX LYMPHEDEMA SCREENING: WNL  TODAY'S TREATMENT  Pt permission and consent throughout each step of examination and treatment with modification and draping if requested when working on sensitive areas  08/13/21 MLD: Short neck and sternal nodes, 5 diaphragmatic breaths, bil axillary nodes, left inguinal nodes, anterior inter-axillary pathway, left axillo-inguinal pathway, and then left breast towards the nearest nodes/pathway, then work in [place] posterior interaxilllary and Lt axillo-inguinal anastomosis, then finished retracing all steps in supine MFR: in right sidelying to the latissimus, paraspinals, trapezius, PROM During MLD  Instructed patient to email Megan regarding back pain during pump use and trying to get more coverage at the medial breast.   07/23/21 MLD: Short neck and sternal nodes, 5 diaphragmatic breaths, Rt axillary nodes, left inguinal nodes, anterior inter-axillary pathway, left axillo-inguinal pathway, and then left breast towards the nearest nodes/pathway, then work in [place] posterior interaxilllary and Lt axillo-inguinal anastomosis, then finished retracing all steps in supine MFR: During MLD to Lt axilla in end range flexion where tightness  palpable  07/15/21 Performed with pt permission supine: clavicular and sternal nodes, bil axillary nodes, left inguinal nodes avoided today due to appendectomy incision, anterior interaxillary pathway, left axillo-inguinal pathway staying more lateral, and then left breast towards the nearest nodes/pathway, and then repeating all steps in reverse.   PATIENT EDUCATION:  Education details: breast MLD, POC Person educated: Patient Education method: Explanation, Demonstration, Tactile cues, and Verbal cues Education comprehension: verbalized understanding and returned demonstration   HOME EXERCISE PROGRAM: Self MLD Lt breast Use of foam chip pack for fibrosis  ASSESSMENT: CLINICAL IMPRESSION: Pt is liking the flexitouch but it broke and she needed to get a new one.  She continues with medial and inferior breast fibrosis that still softens with MT  so pt still benefits from inclinic appointments.    OBJECTIVE IMPAIRMENTS decreased activity tolerance, decreased knowledge of use of DME, decreased ROM, and increased edema.   ACTIVITY LIMITATIONS community activity and occupation.   PERSONAL FACTORS Age, Behavior pattern, Past/current experiences, and 1-2 comorbidities: radiation and SLNB  are also affecting patient's functional outcome.    REHAB POTENTIAL: Excellent  CLINICAL DECISION MAKING: Stable/uncomplicated  EVALUATION COMPLEXITY: Low  GOALS: Goals reviewed with patient? Yes   LONG TERM GOALS: Target date: 07/15/2021  (Remove Blue Hyperlink)  Pt will decrease feelings of left upper quadrant edema and tightness by at least 50% Baseline:  Goal status: INITIAL  2.  Pt will be ind with self MLD or use of compression pump Baseline:  Goal status: INITIAL  3.  Pt will be scheduled for continued SOZO surveillance Baseline:  Goal status: INITIAL  4.  Pt will be ind with final HEP Baseline:  Goal status: INITIAL PLAN: PT FREQUENCY: 1x/week  PT DURATION: 6 weeks  PLANNED  INTERVENTIONS: Therapeutic exercises, Patient/Family education, Joint mobilization, DME instructions, Moist heat, Manual lymph drainage, and Manual therapy  PLAN FOR NEXT SESSION: how was foam?, Left breast MLD, gentle STM lat and scapular region   Stark Bray, PT 08/13/2021, 1:53 PM

## 2021-09-04 ENCOUNTER — Ambulatory Visit: Attending: Hematology and Oncology | Admitting: Rehabilitation

## 2021-09-04 ENCOUNTER — Encounter: Payer: Self-pay | Admitting: Rehabilitation

## 2021-09-04 ENCOUNTER — Ambulatory Visit
Admission: RE | Admit: 2021-09-04 | Discharge: 2021-09-04 | Disposition: A | Discharge status: Home / Self Care | Source: Ambulatory Visit | Attending: Adult Health | Admitting: Adult Health

## 2021-09-04 DIAGNOSIS — C50412 Malignant neoplasm of upper-outer quadrant of left female breast: Secondary | ICD-10-CM | POA: Insufficient documentation

## 2021-09-04 DIAGNOSIS — Z170 Estrogen receptor positive status [ER+]: Secondary | ICD-10-CM

## 2021-09-04 DIAGNOSIS — R293 Abnormal posture: Secondary | ICD-10-CM

## 2021-09-04 DIAGNOSIS — M542 Cervicalgia: Secondary | ICD-10-CM

## 2021-09-04 DIAGNOSIS — R600 Localized edema: Secondary | ICD-10-CM

## 2021-09-04 MED ORDER — GADOBUTROL 1 MMOL/ML IV SOLN
10.0000 mL | Freq: Once | INTRAVENOUS | Status: AC | PRN
  Administered 2021-09-04: 10 mL via INTRAVENOUS

## 2021-09-04 NOTE — Therapy (Signed)
  OUTPATIENT PHYSICAL THERAPY SOZO SCREENING NOTE   Patient Name: Gabriela Evans MRN: [id number] DOB:1980/07/05, 41 y.o., female Today's Date: 09/04/2021  PCP: Nicholas Lose, MD REFERRING PROVIDER: Nicholas Lose, MD   PT End of Session - 09/04/21 1058     Visit Number 5   screen only            Past Medical History:  Diagnosis Date   Anemia    Breast cancer ([place])    Complication of anesthesia    nausea   Gestational diabetes    Kidney infection    Other abnormal glucose    Gestational diabetes with both children   Personal history of radiation therapy    Pneumonia    01-2019   PONV (postoperative nausea and vomiting)    Past Surgical History:  Procedure Laterality Date   BREAST LUMPECTOMY     BREAST LUMPECTOMY WITH RADIOACTIVE SEED AND SENTINEL LYMPH NODE BIOPSY Left 11/29/2020   Procedure: LEFT BREAST LUMPECTOMY WITH RADIOACTIVE SEED AND SENTINEL LYMPH NODE BIOPSY;  Surgeon: Jovita Kussmaul, MD;  Location: [place];  Service: General;  Laterality: Left;   CESAREAN SECTION     X2   IUD inserted in [place] N/A 07/08/2021   Procedure: APPENDECTOMY LAPAROSCOPIC;  Surgeon: Jules Husbands, MD;  Location: [facility];  Service: General;  Laterality: N/A;   PELVIC LAPAROSCOPY  2009   remove perforated IUD   ROBOTIC ASSISTED LAPAROSCOPIC LYSIS OF ADHESION N/A 04/26/2019   Procedure: XI ROBOTIC ASSISTED LAPAROSCOPIC LYSIS OF ADHESION;  Surgeon: [name], MD;  Location: [place];  Service: Gynecology;  Laterality: N/A;   ROBOTIC ASSISTED TOTAL HYSTERECTOMY WITH BILATERAL SALPINGO OOPHERECTOMY Bilateral 04/26/2019   Procedure: XI ROBOTIC ASSISTED TOTAL LAPAROSCOPIC HYSTERECTOMY WITH BILATERAL SALPINGECTOMY;  Surgeon: [name], MD;  Location: [place];  Service: Gynecology;  Laterality: Bilateral;  requests 8:30 OR start in [place] block time requests 2 hours   WISDOM TOOTH  EXTRACTION     Patient Active Problem List   Diagnosis Date Noted   Acute appendicitis 07/08/2021   Genetic testing 11/07/2020   Malignant neoplasm of upper-outer quadrant of left breast in female, estrogen receptor positive ([place]) 10/28/2020   Postoperative state 04/26/2019   Menorrhagia 03/21/2018    REFERRING DIAG: left breast cancer at risk for lymphedema  THERAPY DIAG:  Malignant neoplasm of upper-outer quadrant of left breast in female, estrogen receptor positive ([place])  Cervicalgia  Abnormal posture  Localized edema  PERTINENT HISTORY:  Lt lumpectomy with 2 negative lymph nodes removed on 11/29/20.  She underwent a partial hysterectomy (ovaries still remain) in 04/2019. Radiation completed. Hx of antibiotics for possible breast infection.   PRECAUTIONS: left UE Lymphedema risk,   SUBJECTIVE: Still using the flexitouch and its about the same  PAIN:  Are you having pain? No  SOZO SCREENING: Patient was assessed today using the SOZO machine to determine the lymphedema index score. This was compared to her baseline score. It was determined that she is within the recommended range when compared to her baseline and no further action is needed at this time. She will continue SOZO screenings. These are done every 3 months for 2 years post operatively followed by every 6 months for 2 years, and then annually.   Stark Bray, PT 09/04/2021, 10:59 AM

## 2021-09-19 ENCOUNTER — Encounter: Payer: Self-pay | Admitting: Rehabilitation

## 2021-09-22 ENCOUNTER — Ambulatory Visit (INDEPENDENT_AMBULATORY_CARE_PROVIDER_SITE_OTHER)

## 2021-09-22 ENCOUNTER — Ambulatory Visit
Admission: EM | Admit: 2021-09-22 | Discharge: 2021-09-22 | Disposition: A | Discharge status: Home / Self Care | Attending: Emergency Medicine | Admitting: Emergency Medicine

## 2021-09-22 DIAGNOSIS — R1030 Lower abdominal pain, unspecified: Secondary | ICD-10-CM | POA: Diagnosis not present

## 2021-09-22 DIAGNOSIS — M545 Low back pain, unspecified: Secondary | ICD-10-CM | POA: Diagnosis not present

## 2021-09-22 MED ORDER — BACLOFEN 10 MG PO TABS: 10.0000 mg | ORAL_TABLET | Freq: Three times a day (TID) | ORAL | 0 refills | Status: DC

## 2021-09-22 MED ORDER — HYDROCODONE-ACETAMINOPHEN 5-325 MG PO TABS: 2.0000 | ORAL_TABLET | ORAL | 0 refills | Status: DC | PRN

## 2021-09-22 MED ORDER — IBUPROFEN 600 MG PO TABS: 600.0000 mg | ORAL_TABLET | Freq: Four times a day (QID) | ORAL | 0 refills | Status: DC | PRN

## 2021-09-22 MED ORDER — IBUPROFEN 600 MG PO TABS
600.0000 mg | ORAL_TABLET | Freq: Once | ORAL | Status: AC
Start: 2021-09-22 — End: 2021-09-22
  Administered 2021-09-22: 600 mg via ORAL

## 2021-09-22 NOTE — ED Triage Notes (Signed)
Patient presents to UC for lower back pain for about 10 days.   Patient denies any injury or any urinary symptoms.   Patient reports that she is on lifting restrictions due to her cancer surgery.

## 2021-09-22 NOTE — ED Provider Notes (Signed)
[facility]    CSN: [id number] Arrival date & time: 09/22/21  0801      History   Chief Complaint Chief Complaint  Patient presents with   Back Pain    HPI Gabriela Evans is a 41 y.o. female.   HPI  41 year old female here for evaluation of low back pain.  Patient reports that she has been experiencing midline L5-S1 low back pain for the last 10 days.  She states that the pain does not radiate into her hips or legs and that it does feel more prominent on the right than the left.  This is not associated with any injury or lifting.  She is currently on a 5 pound weight restriction secondary to a lumpectomy for breast cancer.  She denies any pain with urination or urinary urgency or frequency.  No vaginal discharge or itching.  No numbness, tingling, weakness in her lower extremities.  The pain is worse with change in position from sitting to standing.  Past Medical History:  Diagnosis Date   Anemia    Breast cancer ([place])    Complication of anesthesia    nausea   Gestational diabetes    Kidney infection    Other abnormal glucose    Gestational diabetes with both children   Personal history of radiation therapy    Pneumonia    01-2019   PONV (postoperative nausea and vomiting)     Patient Active Problem List   Diagnosis Date Noted   Acute appendicitis 07/08/2021   Genetic testing 11/07/2020   Malignant neoplasm of upper-outer quadrant of left breast in female, estrogen receptor positive ([place]) 10/28/2020   Postoperative state 04/26/2019   Menorrhagia 03/21/2018    Past Surgical History:  Procedure Laterality Date   APPENDECTOMY     BREAST LUMPECTOMY     BREAST LUMPECTOMY WITH RADIOACTIVE SEED AND SENTINEL LYMPH NODE BIOPSY Left 11/29/2020   Procedure: LEFT BREAST LUMPECTOMY WITH RADIOACTIVE SEED AND SENTINEL LYMPH NODE BIOPSY;  Surgeon: Jovita Kussmaul, MD;  Location: [place];  Service: General;  Laterality: Left;   CESAREAN SECTION      X2   IUD inserted in [place] N/A 07/08/2021   Procedure: APPENDECTOMY LAPAROSCOPIC;  Surgeon: Jules Husbands, MD;  Location: [facility];  Service: General;  Laterality: N/A;   PELVIC LAPAROSCOPY  2009   remove perforated IUD   ROBOTIC ASSISTED LAPAROSCOPIC LYSIS OF ADHESION N/A 04/26/2019   Procedure: XI ROBOTIC ASSISTED LAPAROSCOPIC LYSIS OF ADHESION;  Surgeon: [name], MD;  Location: [place];  Service: Gynecology;  Laterality: N/A;   ROBOTIC ASSISTED TOTAL HYSTERECTOMY WITH BILATERAL SALPINGO OOPHERECTOMY Bilateral 04/26/2019   Procedure: XI ROBOTIC ASSISTED TOTAL LAPAROSCOPIC HYSTERECTOMY WITH BILATERAL SALPINGECTOMY;  Surgeon: [name], MD;  Location: [place];  Service: Gynecology;  Laterality: Bilateral;  requests 8:30 OR start in [place] block time requests 2 hours   WISDOM TOOTH EXTRACTION      OB History     Gravida  2   Para  2   Term  1   Preterm      AB  0   Living  2      SAB      IAB      Ectopic  0   Multiple      Live Births               Home Medications  Prior to Admission medications   Medication Sig Start Date End Date Taking? Authorizing Provider  baclofen (LIORESAL) 10 MG tablet Take 1 tablet (10 mg total) by mouth 3 (three) times daily. 09/22/21  Yes Margarette Canada, NP  HYDROcodone-acetaminophen (NORCO/VICODIN) 5-325 MG tablet Take 2 tablets by mouth every 4 (four) hours as needed. 09/22/21  Yes Margarette Canada, NP  ibuprofen (ADVIL) 600 MG tablet Take 1 tablet (600 mg total) by mouth every 6 (six) hours as needed. 09/22/21  Yes Margarette Canada, NP  BLACK ELDERBERRY,BERRY-FLOWER, PO Take 1 capsule by mouth daily.    [provider]  Charcoal Activated (ACTIVATED CHARCOAL) POWD by Does not apply route as needed.    [provider]  gabapentin (NEURONTIN) 100 MG capsule Take 100 mg by mouth 3 (three) times daily. 04/04/21   [provider]   Medium Chain Triglycerides (MCT OIL PO) Take by mouth daily.    [provider]  Multiple Vitamins-Minerals (MULTIVITAMIN WITH MINERALS) tablet Take 2 tablets by mouth daily.    [provider]  Probiotic Product (PROBIOTIC PO) Take 1 tablet by mouth daily.    [provider]  Theanine 200 MG CAPS Take by mouth.    [provider]  vitamin C (ASCORBIC ACID) 250 MG tablet Take 250 mg by mouth daily.    [provider]  vitamin E 1000 UNIT capsule Take 1,000 Units by mouth daily.    [provider]  ferrous sulfate 325 (65 FE) MG tablet Take 325 mg by mouth daily with breakfast.  10/16/19  [provider]    Family History Family History  Problem Relation Age of Onset   Diabetes Mother    Thyroid disease Father    Diabetes Father    Thyroid disease Sister    Thyroid cancer Maternal Aunt        papillary; dx 40s   Brain cancer Maternal Uncle 2       gliolblastoma   Rectal cancer Paternal Uncle        mets; dx 44s   Cancer Paternal Uncle        unknown type; dx 57s   Thyroid cancer Maternal Grandmother        follicular   Prostate cancer Maternal Grandfather        dx 74s   Melanoma Paternal Grandmother        mets; dx 47s    Social History Social History   Tobacco Use   Smoking status: Never   Smokeless tobacco: Never  Vaping Use   Vaping Use: Never used  Substance Use Topics   Alcohol use: Yes    Comment: occas   Drug use: No     Allergies   Nickel, Other, Tramadol, Oxycodone-acetaminophen, and Tamoxifen   Review of Systems Review of Systems  Constitutional:  Negative for appetite change.  Genitourinary:  Negative for dysuria, frequency, urgency and vaginal discharge.  Musculoskeletal:  Positive for back pain. Negative for gait problem.  Neurological:  Negative for weakness and numbness.  Hematological: Negative.   Psychiatric/Behavioral: Negative.       Physical Exam Triage Vital Signs ED  Triage Vitals  Enc Vitals Group     BP 09/22/21 0808 131/80     Pulse Rate 09/22/21 0808 84     Resp --      Temp 09/22/21 0808 98.2 F (36.8 C)     Temp Source 09/22/21 0808 Oral     SpO2 09/22/21 0808 100 %  Weight 09/22/21 0807 213 lb (96.6 kg)     Height 09/22/21 0807 5' 9.5" (1.765 m)     Head Circumference --      Peak Flow --      Pain Score 09/22/21 0807 5     Pain Loc --      Pain Edu? --      Excl. in [place]? --    No data found.  Updated Vital Signs BP 131/80 (BP Location: Right Arm)   Pulse 84   Temp 98.2 F (36.8 C) (Oral)   Ht 5' 9.5" (1.765 m)   Wt 213 lb (96.6 kg)   LMP 04/18/2019   SpO2 100%   BMI 31.00 kg/m   Visual Acuity Right Eye Distance:   Left Eye Distance:   Bilateral Distance:    Right Eye Near:   Left Eye Near:    Bilateral Near:     Physical Exam Vitals and nursing note reviewed.  Constitutional:      Appearance: Normal appearance. She is not ill-appearing.  HENT:     Head: Normocephalic and atraumatic.  Cardiovascular:     Rate and Rhythm: Normal rate and regular rhythm.     Pulses: Normal pulses.     Heart sounds: Normal heart sounds. No murmur heard.    No friction rub. No gallop.  Pulmonary:     Effort: Pulmonary effort is normal.     Breath sounds: Normal breath sounds. No wheezing, rhonchi or rales.  Musculoskeletal:        General: Tenderness present. No swelling, deformity or signs of injury. Normal range of motion.  Skin:    General: Skin is warm and dry.     Capillary Refill: Capillary refill takes less than 2 seconds.     Findings: No bruising.  Neurological:     General: No focal deficit present.     Mental Status: She is alert and oriented to person, place, and time.     Sensory: No sensory deficit.     Motor: No weakness.  Psychiatric:        Mood and Affect: Mood normal.        Behavior: Behavior normal.        Thought Content: Thought content normal.        Judgment: Judgment normal.      UC  Treatments / Results  Labs (all labs ordered are listed, but only abnormal results are displayed) Labs Reviewed - No data to display  EKG   Radiology DG Lumbar Spine Complete  Result Date: 09/22/2021 CLINICAL DATA:  Low back pain without injury. EXAM: LUMBAR SPINE - COMPLETE 4+ VIEW COMPARISON:  None Available. FINDINGS: There is no evidence of lumbar spine fracture. Alignment is normal. Intervertebral disc spaces are maintained. IMPRESSION: Negative. Electronically Signed   By: Misty Stanley M.D.   On: 09/22/2021 09:07    Procedures Procedures (including critical care time)  Medications Ordered in UC Medications  ibuprofen (ADVIL) tablet 600 mg (600 mg Oral Given 09/22/21 0829)    Initial Impression / Assessment and Plan / UC Course  I have reviewed the triage vital signs and the nursing notes.  Pertinent labs & imaging results that were available during my care of the patient were reviewed by me and considered in my medical decision making (see chart for details).   Patient is a nontoxic-appearing 41 year old female here for evaluation of centralized low back pain has been going for less than days as not in the  setting of injury.  She is currently on a 5 pound weight restriction status post lumpectomy in the left breast on 11/29/2020.  She also had an appendectomy on 07/08/2021.  The CT scan performed on 07/08/2021 did not show any concerning lesions in her lumbar spine.  She does have a history of spinal stenosis in her cervical spine.  Patient had an MRI of her thoracic spine on 04/10/2021 which did not show any disc protrusion, significant disc degeneration, or neural impingement.  On exam patient does have midline lower lumbar and specifically L5-S1 tenderness.  She does not have any left or right paraspinous tenderness or spasm.  No pain radiating into the buttocks.  Bilateral lower extremity strength is 5/5.  Patient has a negative seated straight leg raise bilaterally.  Resisted  extension of both lower extremities does increase the pain in her back.  Resisted flexion of her lower extremities does not significantly increase the pain in her low back.  I will obtain imaging of patient's L-spine to look for any disc space narrowing, spurring, or arthropathy.  Lumbar spine films independently reviewed and evaluated by me.  Pression: There is a loss of lumbar lordosis.  The joint spaces are maintained.  There are some mild degenerative changes present throughout the vertebral bodies of the lumbar spine.  There does appear to be some mild narrowing on the right side of the L5-S1.  Radiology overread is pending. Radiology impression states that there is no evidence of lumbar spine fracture and that spinal alignment is normal.  Intervertebral disc spaces are maintained.  Negative exam.  I will discharge patient home with a diagnosis of low back pain and treat her with a combination of ibuprofen and baclofen.  I will may also give her a short prescription for Norco as needed for severe pain that she can take at bedtime.  Home physical therapy and moist heat will also be suggested to the patient.   Final Clinical Impressions(s) / UC Diagnoses   Final diagnoses:  Acute midline low back pain without sciatica     Discharge Instructions      Your x-rays today did not show any disc base narrowing or fractures.  You do have some spasm indicated by the loss of curvature to your low back.  Take the ibuprofen, 600 mg every 6 hours with food, on a schedule for the next 48 hours and then as needed.  Take the baclofen, 10 mg every 8 hours, on a schedule for the next 48 hours and then as needed.  Use the hydrocodone as needed for severe pain.  Be mindful of this will make you drowsy so do not drink alcohol or drive if you take it.  Also do not operate any heavy machinery.  Apply moist heat to your back for 30 minutes at a time 2-3 times a day to improve blood flow to the area and help  remove the lactic acid causing the spasm.  Follow the back exercises given at discharge.  Return for reevaluation for any new or worsening symptoms.      ED Prescriptions     Medication Sig Dispense Auth. Provider   ibuprofen (ADVIL) 600 MG tablet Take 1 tablet (600 mg total) by mouth every 6 (six) hours as needed. 30 tablet Margarette Canada, NP   baclofen (LIORESAL) 10 MG tablet Take 1 tablet (10 mg total) by mouth 3 (three) times daily. 30 each Margarette Canada, NP   HYDROcodone-acetaminophen (NORCO/VICODIN) 5-325 MG tablet Take 2  tablets by mouth every 4 (four) hours as needed. 6 tablet Margarette Canada, NP      I have reviewed the PDMP during this encounter.   Margarette Canada, NP 09/22/21 [phone]

## 2021-09-22 NOTE — Discharge Instructions (Addendum)
Your x-rays today did not show any disc base narrowing or fractures.  You do have some spasm indicated by the loss of curvature to your low back.  Take the ibuprofen, 600 mg every 6 hours with food, on a schedule for the next 48 hours and then as needed.  Take the baclofen, 10 mg every 8 hours, on a schedule for the next 48 hours and then as needed.  Use the hydrocodone as needed for severe pain.  Be mindful of this will make you drowsy so do not drink alcohol or drive if you take it.  Also do not operate any heavy machinery.  Apply moist heat to your back for 30 minutes at a time 2-3 times a day to improve blood flow to the area and help remove the lactic acid causing the spasm.  Follow the back exercises given at discharge.  Return for reevaluation for any new or worsening symptoms.

## 2021-09-26 ENCOUNTER — Telehealth: Payer: Self-pay | Admitting: Hematology and Oncology

## 2021-09-26 NOTE — Telephone Encounter (Signed)
Scheduled appointment per 9/5 staff message. Patient is aware.

## 2021-10-07 ENCOUNTER — Other Ambulatory Visit: Payer: Self-pay | Admitting: Hematology and Oncology

## 2021-10-07 DIAGNOSIS — Z853 Personal history of malignant neoplasm of breast: Secondary | ICD-10-CM

## 2021-10-21 ENCOUNTER — Ambulatory Visit
Admission: RE | Admit: 2021-10-21 | Discharge: 2021-10-21 | Disposition: A | Discharge status: Home / Self Care | Source: Ambulatory Visit | Attending: Hematology and Oncology | Admitting: Hematology and Oncology

## 2021-10-21 DIAGNOSIS — Z853 Personal history of malignant neoplasm of breast: Secondary | ICD-10-CM

## 2021-11-01 ENCOUNTER — Encounter: Admitting: Physician Assistant

## 2021-11-01 NOTE — Progress Notes (Signed)
Scheduled in error, child needs apptThis encounter was created in error - please disregard.

## 2021-11-16 ENCOUNTER — Encounter: Payer: Self-pay | Admitting: Hematology and Oncology

## 2021-11-24 ENCOUNTER — Other Ambulatory Visit: Payer: Self-pay | Admitting: *Deleted

## 2021-11-25 ENCOUNTER — Encounter: Payer: Self-pay | Admitting: *Deleted

## 2021-11-30 NOTE — Progress Notes (Signed)
Patient Care Team: Nicholas Lose, MD as PCP - General (Hematology and Oncology) Jovita Kussmaul, MD as Consulting Physician (General Surgery) Nicholas Lose, MD as Consulting Physician (Hematology and Oncology) Kyung Rudd, MD as Consulting Physician (Radiation Oncology)  DIAGNOSIS: No diagnosis found.  SUMMARY OF ONCOLOGIC HISTORY: Oncology History  Malignant neoplasm of upper-outer quadrant of left breast in female, estrogen receptor positive ([place])  10/28/2020 Initial Diagnosis   Presented with new focal thickening and pain her outer left breast. Diagnostic mammogram and [place]: indeterminate mass in the left breast at 3:00. 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  Breast MRI: 1 prominent lymph node and 9 mm mass     11/06/2020 Genetic Testing   Negative hereditary cancer genetic testing: no pathogenic variants detected in [facility] BRCAPlus Panel and [facility] CancerNext-Expanded +RNAinsight Panel. The report dates are November 06, 2020 and November 10, 2020, respectively.   The BRCAplus panel offered by [employer] and includes sequencing and deletion/duplication analysis for the following 8 genes: ATM, BRCA1, BRCA2, CDH1, CHEK2, PALB2, PTEN, and TP53.  The CancerNext-Expanded gene panel offered by [facility] and includes sequencing, rearrangement, and RNA analysis for the following 77 genes: AIP, ALK, APC, ATM, AXIN2, BAP1, BARD1, BLM, BMPR1A, BRCA1, BRCA2, BRIP1, CDC73, CDH1, CDK4, CDKN1B, CDKN2A, CHEK2, CTNNA1, DICER1, FANCC, FH, FLCN, GALNT12, KIF1B, LZTR1, MAX, MEN1, MET, MLH1, MSH2, MSH3, MSH6, MUTYH, NBN, NF1, NF2, NTHL1, PALB2, PHOX2B, PMS2, POT1, PRKAR1A, PTCH1, PTEN, RAD51C, RAD51D, RB1, RECQL, RET, SDHA, SDHAF2, SDHB, SDHC, SDHD, SMAD4, SMARCA4, SMARCB1, SMARCE1, STK11, SUFU, TMEM127, TP53, TSC1, TSC2, VHL and XRCC2 (sequencing and deletion/duplication); EGFR, EGLN1, HOXB13, KIT, MITF, PDGFRA, POLD1, and POLE (sequencing  only); EPCAM and GREM1 (deletion/duplication only).    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, margins negative, ER 95%, PR 100%, HER2 negative, Ki-67 10%    12/11/2020 Oncotype testing   Oncotype score: 20, distant recurrence at 9 years: 6%   01/14/2021 - 03/03/2021 Radiation Therapy     Site Technique Total Dose (Gy) Dose per Fx (Gy) Completed Fx Beam Energies  Breast, Left: Breast_L 3D 50.4/50.4 1.8 28/28 6X  Breast, Left: Breast_L_Bst 3D 10/10 2 5/5 6X     03/2021 -  Anti-estrogen oral therapy   Tamoxifen daily     CHIEF COMPLIANT: Follow-up left breast after surgery  INTERVAL HISTORY: Gabriela Evans is a 41 year old above-mentioned history of left breast and had some swelling with heat and slight redness. She presents to the clinic for a follow-up.    ALLERGIES:  is allergic to nickel, other, tramadol, oxycodone-acetaminophen, and tamoxifen.  MEDICATIONS:  Current Outpatient Medications  Medication Sig Dispense Refill   baclofen (LIORESAL) 10 MG tablet Take 1 tablet (10 mg total) by mouth 3 (three) times daily. 30 each 0   BLACK ELDERBERRY,BERRY-FLOWER, PO Take 1 capsule by mouth daily.     Charcoal Activated (ACTIVATED CHARCOAL) POWD by Does not apply route as needed.     gabapentin (NEURONTIN) 100 MG capsule Take 100 mg by mouth 3 (three) times daily.     HYDROcodone-acetaminophen (NORCO/VICODIN) 5-325 MG tablet Take 2 tablets by mouth every 4 (four) hours as needed. 6 tablet 0   ibuprofen (ADVIL) 600 MG tablet Take 1 tablet (600 mg total) by mouth every 6 (six) hours as needed. 30 tablet 0   Medium Chain Triglycerides (MCT OIL PO) Take by mouth daily.     Multiple Vitamins-Minerals (MULTIVITAMIN  WITH MINERALS) tablet Take 2 tablets by mouth daily.     Probiotic Product (PROBIOTIC PO) Take 1 tablet by mouth daily.     Theanine 200 MG CAPS Take by mouth.     vitamin C (ASCORBIC ACID) 250 MG tablet Take  250 mg by mouth daily.     vitamin E 1000 UNIT capsule Take 1,000 Units by mouth daily.     No current facility-administered medications for this visit.    PHYSICAL EXAMINATION: ECOG PERFORMANCE STATUS: {CHL ONC ECOG PS:[phone]}  There were no vitals filed for this visit. There were no vitals filed for this visit.  BREAST:*** No palpable masses or nodules in either right or left breasts. No palpable axillary supraclavicular or infraclavicular adenopathy no breast tenderness or nipple discharge. (exam performed in the presence of a chaperone)  LABORATORY DATA:  I have reviewed the data as listed    Latest Ref Rng & Units 07/08/2021    8:44 AM 02/18/2021    2:22 PM 10/30/2020   12:11 PM  CMP  Glucose 70 - 99 mg/dL 104  89  95   BUN 6 - 20 mg/dL _0 Creatinine 0.44 - 1.00 mg/dL 0.64  0.70  0.67   Sodium 135 - 145 mmol/L 141  139  142   Potassium 3.5 - 5.1 mmol/L 4.0  3.6  4.0   Chloride 98 - 111 mmol/L 106  105  110   CO2 22 - 32 mmol/L _1 Calcium 8.9 - 10.3 mg/dL 9.4  9.4  9.8   Total Protein 6.5 - 8.1 g/dL 7.3  7.2  7.6   Total Bilirubin 0.3 - 1.2 mg/dL 0.9  0.3  0.4   Alkaline Phos 38 - 126 U/L 60  47  46   AST 15 - 41 U/L _2 ALT 0 - 44 U/L _3 Lab Results  Component Value Date   WBC 6.8 07/08/2021   HGB 13.3 07/08/2021   HCT 40.6 07/08/2021   MCV 89.8 07/08/2021   PLT 272 07/08/2021   NEUTROABS 3.7 02/18/2021    ASSESSMENT & PLAN:  No problem-specific Assessment & Plan notes found for this encounter.    No orders of the defined types were placed in this encounter.  The patient has a good understanding of the overall plan. she agrees with it. she will call with any problems that may develop before the next visit here. Total time spent: 30 mins including face to face time and time spent for planning, charting and co-ordination of care   Suzzette Righter, [place] 11/30/21    I Gardiner Coins am scribing for Dr.  Lindi Adie  ***

## 2021-12-03 ENCOUNTER — Ambulatory Visit: Admitting: Rehabilitation

## 2021-12-04 ENCOUNTER — Inpatient Hospital Stay: Attending: Hematology and Oncology | Admitting: Hematology and Oncology

## 2021-12-04 ENCOUNTER — Ambulatory Visit: Attending: Hematology and Oncology | Admitting: Rehabilitation

## 2021-12-04 ENCOUNTER — Encounter: Payer: Self-pay | Admitting: Rehabilitation

## 2021-12-04 VITALS — BP 127/83 | HR 76 | Temp 97.5°F | Resp 18 | Ht 69.5 in | Wt 216.0 lb

## 2021-12-04 DIAGNOSIS — Z923 Personal history of irradiation: Secondary | ICD-10-CM | POA: Diagnosis not present

## 2021-12-04 DIAGNOSIS — Z79810 Long term (current) use of selective estrogen receptor modulators (SERMs): Secondary | ICD-10-CM | POA: Diagnosis not present

## 2021-12-04 DIAGNOSIS — Z170 Estrogen receptor positive status [ER+]: Secondary | ICD-10-CM | POA: Insufficient documentation

## 2021-12-04 DIAGNOSIS — R293 Abnormal posture: Secondary | ICD-10-CM | POA: Insufficient documentation

## 2021-12-04 DIAGNOSIS — C50412 Malignant neoplasm of upper-outer quadrant of left female breast: Secondary | ICD-10-CM | POA: Insufficient documentation

## 2021-12-04 DIAGNOSIS — M542 Cervicalgia: Secondary | ICD-10-CM | POA: Insufficient documentation

## 2021-12-04 DIAGNOSIS — R600 Localized edema: Secondary | ICD-10-CM | POA: Insufficient documentation

## 2021-12-04 NOTE — Assessment & Plan Note (Signed)
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 Oncotype DX: Low risk no role of chemo 2. Adjuvant radiation therapy completed 03/03/2021 3. Adjuvant antiestrogen therapy started 03/19/2021 discontinued 05/03/2021 (allergic reaction to Tamoxifen) -------------------------------------------------------------------------------------------------------------- Breast cancer surveillance: Breast MRI 09/04/2021: Benign breast density category D Mammogram 10/21/2021: Marked skin thickening left breast with lymphedema benign breast density category C Breast exam 12/04/2021: Benign  Continue with annual mammogram and breast MRI surveillance 

## 2021-12-04 NOTE — Therapy (Signed)
OUTPATIENT PHYSICAL THERAPY SOZO SCREENING NOTE   Patient Name: Gabriela Evans MRN: [id number] DOB:[birth year], 41 y.o., female Today's Date: 12/04/2021  PCP: Nicholas Lose, MD REFERRING PROVIDER: Nicholas Lose, MD   PT End of Session - 12/04/21 1001     Visit Number 5   SOZO   Number of Visits 9    Date for PT Re-Evaluation 07/15/21    PT Start Time 0952    PT Stop Time 1001    PT Time Calculation (min) 9 min    Activity Tolerance Patient tolerated treatment well    Behavior During Therapy [facility] for tasks assessed/performed             Past Medical History:  Diagnosis Date   Anemia    Breast cancer ([place])    Complication of anesthesia    nausea   Gestational diabetes    Kidney infection    Other abnormal glucose    Gestational diabetes with both children   Personal history of radiation therapy    Pneumonia    01-2019   PONV (postoperative nausea and vomiting)    Past Surgical History:  Procedure Laterality Date   APPENDECTOMY     BREAST LUMPECTOMY     BREAST LUMPECTOMY WITH RADIOACTIVE SEED AND SENTINEL LYMPH NODE BIOPSY Left 11/29/2020   Procedure: LEFT BREAST LUMPECTOMY WITH RADIOACTIVE SEED AND SENTINEL LYMPH NODE BIOPSY;  Surgeon: Jovita Kussmaul, MD;  Location: [facility];  Service: General;  Laterality: Left;   CESAREAN SECTION     X2   IUD inserted in [place] N/A 07/08/2021   Procedure: APPENDECTOMY LAPAROSCOPIC;  Surgeon: Jules Husbands, MD;  Location: [facility];  Service: General;  Laterality: N/A;   PELVIC LAPAROSCOPY  2009   remove perforated IUD   ROBOTIC ASSISTED LAPAROSCOPIC LYSIS OF ADHESION N/A 04/26/2019   Procedure: XI ROBOTIC ASSISTED LAPAROSCOPIC LYSIS OF ADHESION;  Surgeon: [name], MD;  Location: [facility];  Service: Gynecology;  Laterality: N/A;   ROBOTIC ASSISTED TOTAL HYSTERECTOMY WITH BILATERAL SALPINGO OOPHERECTOMY Bilateral 04/26/2019   Procedure: XI ROBOTIC ASSISTED  TOTAL LAPAROSCOPIC HYSTERECTOMY WITH BILATERAL SALPINGECTOMY;  Surgeon: [name], MD;  Location: [facility];  Service: Gynecology;  Laterality: Bilateral;  requests 8:30 OR start in [place] block time requests 2 hours   WISDOM TOOTH EXTRACTION     Patient Active Problem List   Diagnosis Date Noted   Acute appendicitis 07/08/2021   Genetic testing 11/07/2020   Malignant neoplasm of upper-outer quadrant of left breast in female, estrogen receptor positive ([place]) 10/28/2020   Postoperative state 04/26/2019   Menorrhagia 03/21/2018    REFERRING DIAG: left breast cancer at risk for lymphedema  THERAPY DIAG:  Malignant neoplasm of upper-outer quadrant of left breast in female, estrogen receptor positive ([place])  Abnormal posture  Localized edema  Cervicalgia  PERTINENT HISTORY: Lt lumpectomy with 2 negative lymph nodes removed on 11/29/20.  She underwent a partial hysterectomy (ovaries still remain) in 04/2019. Radiation completed. Hx of antibiotics for possible breast infection   PRECAUTIONS: left UE Lymphedema risk, None  SUBJECTIVE: very tender under armpit by the incision site  PAIN:  Are you having pain? No more just soreness   SOZO SCREENING: Patient was assessed today using the SOZO machine to determine the lymphedema index score. This was compared to her baseline score. It was determined that she is within the recommended range when compared to her baseline and no further  action is needed at this time. She will continue SOZO screenings. These are done every 3 months for 2 years post operatively followed by every 6 months for 2 years, and then annually.     Rosario Jacks, Student-PT 12/04/2021  10:02 AM

## 2022-01-21 ENCOUNTER — Encounter: Payer: Self-pay | Admitting: *Deleted

## 2022-01-21 NOTE — Progress Notes (Signed)
Received call from Dr. Lisa Roca with [employer] requesting work restrictions for pt.  Dr. Caryn Section requesting fax number for paperwork to be sent to our office.  RN verified fax and will have FMLA team fill out paperwork once received.

## 2022-01-30 ENCOUNTER — Encounter: Payer: Self-pay | Admitting: Hematology and Oncology

## 2022-02-04 ENCOUNTER — Telehealth: Payer: Self-pay | Admitting: *Deleted

## 2022-02-04 NOTE — Telephone Encounter (Signed)
Dr. Caryn Section with [employer] [phone]) has called and sent letter stating [facility] [place] "currently does not have medical evidence to support restricted work.  Out of work since November 2022. Follow up on as needed basis. S/P Left Breast Lumpectomy 11/29/2020. Dominant hand: right  Radiotherapy 01/14/2021 - 03/03/2021 Tamoxifen discontinued post rash. SOZO screenings WNL continue every 85-month. Currently [employer] letter in collaborative bin for provider review, signature and return to this nurse.

## 2022-02-06 NOTE — Telephone Encounter (Signed)
Today received signed paperwork.  Successfully faxed to [place] [phone]  Returned to [employer] address on file.   [address] [place] [id number] Copy to [employer] bin designated for items to be scanned.  No further instructions received or performed by this nurse.

## 2022-03-03 ENCOUNTER — Ambulatory Visit: Attending: Hematology and Oncology | Admitting: Physical Therapy

## 2022-03-03 VITALS — Wt 215.0 lb

## 2022-03-03 DIAGNOSIS — C50412 Malignant neoplasm of upper-outer quadrant of left female breast: Secondary | ICD-10-CM | POA: Insufficient documentation

## 2022-03-03 DIAGNOSIS — Z170 Estrogen receptor positive status [ER+]: Secondary | ICD-10-CM | POA: Insufficient documentation

## 2022-03-03 NOTE — Therapy (Signed)
OUTPATIENT PHYSICAL THERAPY SOZO SCREENING NOTE   Patient Name: Gabriela Evans MRN: [id number] DOB:June 23, 1980, 42 y.o., female Today's Date: 03/03/2022  PCP: Nicholas Lose, MD REFERRING PROVIDER: Nicholas Lose, MD   PT End of Session - 03/03/22 [phone]     Visit Number 5   unchanged due to screen only   PT Start Time 0944    PT Stop Time 0951    PT Time Calculation (min) 7 min    Activity Tolerance Patient tolerated treatment well    Behavior During Therapy [facility] for tasks assessed/performed             Past Medical History:  Diagnosis Date   Anemia    Breast cancer ([place])    Complication of anesthesia    nausea   Gestational diabetes    Kidney infection    Other abnormal glucose    Gestational diabetes with both children   Personal history of radiation therapy    Pneumonia    01-2019   PONV (postoperative nausea and vomiting)    Past Surgical History:  Procedure Laterality Date   APPENDECTOMY     BREAST LUMPECTOMY     BREAST LUMPECTOMY WITH RADIOACTIVE SEED AND SENTINEL LYMPH NODE BIOPSY Left 11/29/2020   Procedure: LEFT BREAST LUMPECTOMY WITH RADIOACTIVE SEED AND SENTINEL LYMPH NODE BIOPSY;  Surgeon: Jovita Kussmaul, MD;  Location: [facility];  Service: General;  Laterality: Left;   CESAREAN SECTION     X2   IUD inserted in [place] N/A 07/08/2021   Procedure: APPENDECTOMY LAPAROSCOPIC;  Surgeon: Jules Husbands, MD;  Location: [facility];  Service: General;  Laterality: N/A;   PELVIC LAPAROSCOPY  2009   remove perforated IUD   ROBOTIC ASSISTED LAPAROSCOPIC LYSIS OF ADHESION N/A 04/26/2019   Procedure: XI ROBOTIC ASSISTED LAPAROSCOPIC LYSIS OF ADHESION;  Surgeon: [name], MD;  Location: [facility];  Service: Gynecology;  Laterality: N/A;   ROBOTIC ASSISTED TOTAL HYSTERECTOMY WITH BILATERAL SALPINGO OOPHERECTOMY Bilateral 04/26/2019   Procedure: XI ROBOTIC ASSISTED TOTAL LAPAROSCOPIC HYSTERECTOMY WITH  BILATERAL SALPINGECTOMY;  Surgeon: [name], MD;  Location: [facility];  Service: Gynecology;  Laterality: Bilateral;  requests 8:30 OR start in [place] block time requests 2 hours   WISDOM TOOTH EXTRACTION     Patient Active Problem List   Diagnosis Date Noted   Acute appendicitis 07/08/2021   Genetic testing 11/07/2020   Malignant neoplasm of upper-outer quadrant of left breast in female, estrogen receptor positive ([place]) 10/28/2020   Postoperative state 04/26/2019   Menorrhagia 03/21/2018    REFERRING DIAG: left breast cancer at risk for lymphedema  THERAPY DIAG:  Malignant neoplasm of upper-outer quadrant of left breast in female, estrogen receptor positive ([place])  PERTINENT HISTORY: Lt lumpectomy with 2 negative lymph nodes removed on 11/29/20.  She underwent a partial hysterectomy (ovaries still remain) in 04/2019. Radiation completed. Hx of antibiotics for possible breast infection   PRECAUTIONS: left UE Lymphedema risk, None  SUBJECTIVE: I am having some increased swelling and tenderness under axilla along side.   PAIN:  Are you having pain? Tenderness to touch along L lateral trunk, pain when raising arm across chest due to pec tightness 2/10  SOZO SCREENING: Patient was assessed today using the SOZO machine to determine the lymphedema index score. This was compared to her baseline score. It was determined that she is within the recommended range when compared to her baseline and no further action  is needed at this time. She will continue SOZO screenings. These are done every 3 months for 2 years post operatively followed by every 6 months for 2 years, and then annually.     Allyson Sabal [place], [place] 03/03/22 9:53 AM

## 2022-03-25 ENCOUNTER — Encounter: Payer: Self-pay | Admitting: Hematology and Oncology

## 2022-03-26 ENCOUNTER — Encounter: Payer: Self-pay | Admitting: *Deleted

## 2022-03-26 NOTE — Progress Notes (Signed)
Received mychart message from pt with complaint of trouble swallowing, scratchy throat and vocal strain x 4-5 months.  Per MD pt needing [facility] visit for further evaluation.  Appt scheduled, pt notified and verbalized understanding.

## 2022-03-27 ENCOUNTER — Other Ambulatory Visit: Payer: Self-pay

## 2022-03-27 ENCOUNTER — Inpatient Hospital Stay: Attending: Adult Health | Admitting: Adult Health

## 2022-03-27 ENCOUNTER — Telehealth: Payer: Self-pay

## 2022-03-27 VITALS — BP 129/81 | HR 68 | Temp 97.8°F | Resp 16 | Ht 69.0 in | Wt 215.4 lb

## 2022-03-27 DIAGNOSIS — R1314 Dysphagia, pharyngoesophageal phase: Secondary | ICD-10-CM | POA: Diagnosis not present

## 2022-03-27 DIAGNOSIS — Z853 Personal history of malignant neoplasm of breast: Secondary | ICD-10-CM | POA: Insufficient documentation

## 2022-03-27 DIAGNOSIS — Z170 Estrogen receptor positive status [ER+]: Secondary | ICD-10-CM

## 2022-03-27 DIAGNOSIS — R1310 Dysphagia, unspecified: Secondary | ICD-10-CM | POA: Diagnosis present

## 2022-03-27 DIAGNOSIS — Z923 Personal history of irradiation: Secondary | ICD-10-CM | POA: Insufficient documentation

## 2022-03-27 DIAGNOSIS — M542 Cervicalgia: Secondary | ICD-10-CM

## 2022-03-27 DIAGNOSIS — C50412 Malignant neoplasm of upper-outer quadrant of left female breast: Secondary | ICD-10-CM | POA: Diagnosis not present

## 2022-03-27 NOTE — Assessment & Plan Note (Signed)
Gabriela Evans is a 42 year old woman with history of left breast stage Ia ER/PR positive breast cancer diagnosed in October 2022 status post lumpectomy followed by adjuvant radiation and opted to forego antiestrogen therapy after having difficulty with tamoxifen.  She is having increased difficulty swallowing.  I placed orders for a barium swallow along with an ultrasound to identify if there is any functional issue with her inability to get pills down.  I also question whether there may be some thyromegaly or other glandular enlargement that is causing this which is why I requested the ultrasound.  Once we have those results we discussed potentially referring her to ear nose and throat.  We will follow-up with her once we get both of these test completed.

## 2022-03-27 NOTE — Progress Notes (Signed)
[facility] Follow up:    Gabriela Evans, Gabriela Evans   DIAGNOSIS:  Cancer Staging  Malignant neoplasm of upper-outer quadrant of left breast in female, estrogen receptor positive (Gabriela Evans) Staging form: Breast, AJCC 8th Edition - Clinical stage from 10/30/2020: Stage IA (cT1b, cN0, cM0, G2, ER+, PR+, HER2-) - Unsigned Stage prefix: Initial diagnosis Histologic grading system: 3 grade system   SUMMARY OF ONCOLOGIC HISTORY: Oncology History  Malignant neoplasm of upper-outer quadrant of left breast in female, estrogen receptor positive (Gabriela Evans)  10/28/2020 Initial Diagnosis   Presented with new focal thickening and pain her outer left breast. Diagnostic mammogram and [place]: indeterminate mass in the left breast at 3:00. 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  Breast MRI: 1 prominent lymph node and 9 mm mass     11/06/2020 Genetic Testing   Negative hereditary cancer genetic testing: no pathogenic variants detected in [facility] BRCAPlus Panel and [facility] CancerNext-Expanded +RNAinsight Panel. The report dates are November 06, 2020 and November 10, 2020, respectively.   The BRCAplus panel offered by [employer] and includes sequencing and deletion/duplication analysis for the following 8 genes: ATM, BRCA1, BRCA2, CDH1, CHEK2, PALB2, PTEN, and TP53.  The CancerNext-Expanded gene panel offered by [employer] and includes sequencing, rearrangement, and RNA analysis for the following 77 genes: AIP, ALK, APC, ATM, AXIN2, BAP1, BARD1, BLM, BMPR1A, BRCA1, BRCA2, BRIP1, CDC73, CDH1, CDK4, CDKN1B, CDKN2A, CHEK2, CTNNA1, DICER1, FANCC, FH, FLCN, GALNT12, KIF1B, LZTR1, MAX, MEN1, MET, MLH1, MSH2, MSH3, MSH6, MUTYH, NBN, NF1, NF2, NTHL1, PALB2, PHOX2B, PMS2, POT1, PRKAR1A, PTCH1, PTEN, RAD51C, RAD51D, RB1, RECQL, RET, SDHA, SDHAF2, SDHB, SDHC, SDHD, SMAD4, SMARCA4, SMARCB1, SMARCE1, STK11, SUFU,  TMEM127, TP53, TSC1, TSC2, VHL and XRCC2 (sequencing and deletion/duplication); EGFR, EGLN1, HOXB13, KIT, MITF, PDGFRA, POLD1, and POLE (sequencing only); EPCAM and GREM1 (deletion/duplication only).    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 female returns for f/u and evaluation of dysphagia.   "Received mychart message from pt with complaint of trouble swallowing, scratchy throat and vocal strain x 4-5 months.  [facility].  Appt scheduled, pt notified and verbalized understanding. "      She notes that at the end of last year in November she developed difficulty swallowing her vitamins to the point of needing to spit them into the sink.  In January she developed difficulty swallowing foods.  She thought she was eating and drinking too quickly and slowed down, however this persisted.  She has paid more attention tot his as she notes the food gets caught as she is swallowing.  When singing at church she loses her voice more frequently than prior.  She hadn't taken her vitamins in a while, and retried on Wednesday, and wasn't able to take them.  Patient Active Problem List   Diagnosis Date Noted   Acute appendicitis 07/08/2021   Genetic testing 11/07/2020   Malignant neoplasm of upper-outer quadrant of left breast in female, estrogen receptor positive (Gabriela Evans) 10/28/2020    Postoperative state 04/26/2019   Menorrhagia 03/21/2018    is allergic to nickel, other, tramadol, oxycodone-acetaminophen, and tamoxifen.  MEDICAL HISTORY: Past Medical History:  Diagnosis Date   Anemia    Breast cancer ([place])    Complication of anesthesia    nausea   Gestational diabetes    Kidney infection    Other abnormal glucose    Gestational diabetes with both children   Personal history of radiation therapy    Pneumonia    01-2019   PONV (postoperative nausea and vomiting)     SURGICAL HISTORY: Past Surgical History:  Procedure Laterality Date   APPENDECTOMY     BREAST LUMPECTOMY     BREAST LUMPECTOMY WITH RADIOACTIVE SEED AND SENTINEL LYMPH NODE BIOPSY Left 11/29/2020   Procedure: LEFT BREAST LUMPECTOMY WITH RADIOACTIVE SEED AND SENTINEL LYMPH NODE BIOPSY;  Surgeon: Jovita Kussmaul, MD;  Location: [facility];  Service: General;  Laterality: Left;   CESAREAN SECTION     X2   IUD inserted in [place] N/A 07/08/2021   Procedure: APPENDECTOMY LAPAROSCOPIC;  Surgeon: Jules Husbands, MD;  Location: [facility];  Service: General;  Laterality: N/A;   PELVIC LAPAROSCOPY  2009   remove perforated IUD   ROBOTIC ASSISTED LAPAROSCOPIC LYSIS OF ADHESION N/A 04/26/2019   Procedure: XI ROBOTIC ASSISTED LAPAROSCOPIC LYSIS OF ADHESION;  Surgeon: [name], MD;  Location: [facility];  Service: Gynecology;  Laterality: N/A;   ROBOTIC ASSISTED TOTAL HYSTERECTOMY WITH BILATERAL SALPINGO OOPHERECTOMY Bilateral 04/26/2019   Procedure: XI ROBOTIC ASSISTED TOTAL LAPAROSCOPIC HYSTERECTOMY WITH BILATERAL SALPINGECTOMY;  Surgeon: [name], MD;  Location: [facility];  Service: Gynecology;  Laterality: Bilateral;  requests 8:30 OR start in [place] block time requests 2 hours   WISDOM TOOTH EXTRACTION      SOCIAL HISTORY: Social History   Socioeconomic History   Marital status: Married    Spouse  name: Not on file   Number of children: Not on file   Years of education: Not on file   Highest education level: Not on file  Occupational History   Not on file  Tobacco Use   Smoking status: Never   Smokeless tobacco: Never  Vaping Use   Vaping Use: Never used  Substance and Sexual Activity   Alcohol use: Yes    Comment: occas   Drug use: No   Sexual activity: Yes    Birth control/protection: Surgical    Comment: Hyst-INTERCOURSE AGE 38, LESS THA N 5 SEXUAL PARTNERS  Other Topics Concern   Not on file  Social History Narrative   Not on file   Social Determinants of Health   Financial Resource Strain: Not on file  Food Insecurity: Not on file  Transportation Needs: Not on file  Physical Activity: Not on file  Stress: Not on file  Social Connections: Not on file  Intimate Partner Violence: Not on file    FAMILY HISTORY: Family History  Problem Relation Age of Onset   Diabetes Mother    Thyroid disease Father    Diabetes Father    Thyroid disease Sister    Thyroid cancer Maternal Aunt        papillary; dx 43s   Brain cancer  Maternal Uncle 2       gliolblastoma   Rectal cancer Paternal Uncle        mets; dx 48s   Cancer Paternal Uncle        unknown type; dx 40s   Thyroid cancer Maternal Grandmother        follicular   Prostate cancer Maternal Grandfather        dx 37s   Melanoma Paternal Grandmother        mets; dx 1s    Review of Systems  Constitutional:  Negative for appetite change, chills, fatigue, fever and unexpected weight change.  HENT:   Positive for trouble swallowing and voice change. Negative for hearing loss, lump/mass and mouth sores.   Eyes:  Negative for eye problems and icterus.  Respiratory:  Negative for chest tightness, cough and shortness of breath.   Cardiovascular:  Negative for chest pain, leg swelling and palpitations.  Gastrointestinal:  Negative for abdominal distention, abdominal pain, constipation, diarrhea, nausea and  vomiting.  Endocrine: Negative for hot flashes.  Genitourinary:  Negative for difficulty urinating.   Musculoskeletal:  Negative for arthralgias.  Skin:  Negative for itching and rash.  Neurological:  Negative for dizziness, extremity weakness, headaches and numbness.  Hematological:  Negative for adenopathy. Does not bruise/bleed easily.  Psychiatric/Behavioral:  Negative for depression. The patient is not nervous/anxious.       PHYSICAL EXAMINATION  ECOG PERFORMANCE STATUS: 1 - Symptomatic but completely ambulatory  Vitals:   03/27/22 1019  BP: 129/81  Pulse: 68  Resp: 16  Temp: 97.8 F (36.6 C)  SpO2: 100%    Physical Exam Constitutional:      General: She is not in acute distress.    Appearance: Normal appearance. She is not toxic-appearing.  HENT:     Head: Normocephalic and atraumatic.     Mouth/Throat:     Mouth: Mucous membranes are moist.     Pharynx: Oropharynx is clear. No oropharyngeal exudate or posterior oropharyngeal erythema.  Eyes:     General: No scleral icterus. Cardiovascular:     Rate and Rhythm: Normal rate and regular rhythm.     Pulses: Normal pulses.     Heart sounds: Normal heart sounds.  Pulmonary:     Effort: Pulmonary effort is normal.     Breath sounds: Normal breath sounds.  Abdominal:     General: Abdomen is flat. Bowel sounds are normal. There is no distension.     Palpations: Abdomen is soft.     Tenderness: There is no abdominal tenderness.  Musculoskeletal:        General: No swelling.     Cervical back: Neck supple.  Lymphadenopathy:     Cervical: No cervical adenopathy.  Skin:    General: Skin is warm and dry.     Findings: No rash.  Neurological:     General: No focal deficit present.     Mental Status: She is alert.  Psychiatric:        Mood and Affect: Mood normal.        Behavior: Behavior normal.     LABORATORY DATA: None for this visit   ASSESSMENT and THERAPY PLAN:   Malignant neoplasm of upper-outer  quadrant of left breast in female, estrogen receptor positive (Gabriela Evans) Gabriela Evans is a 42 year old woman with history of left breast stage Ia ER/PR positive breast cancer diagnosed in October 2022 status post lumpectomy followed by adjuvant radiation and opted to forego antiestrogen therapy after having difficulty with  tamoxifen.  She is having increased difficulty swallowing.  I placed orders for a barium swallow along with an ultrasound to identify if there is any functional issue with her inability to get pills down.  I also question whether there may be some thyromegaly or other glandular enlargement that is causing this which is why I requested the ultrasound.  Once we have those results we discussed potentially referring her to ear nose and throat.  We will follow-up with her once we get both of these test completed.    All questions were answered. The patient knows to call the clinic with any problems, questions or concerns. We can certainly see the patient much sooner if necessary.  Total encounter time:20 minutes*in face-to-face visit time, chart review, lab review, care coordination, order entry, and documentation of the encounter time.    Wilber Bihari, NP 03/27/22 12:02 PM Medical Oncology and Hematology [facility] [place],  [address] Tel. [phone]    Fax. [phone]  *Total Encounter Time as defined by the Centers for Medicare and Medicaid Services includes, in addition to the face-to-face time of a patient visit (documented in the note above) non-face-to-face time: obtaining and reviewing outside history, ordering and reviewing medications, tests or procedures, care coordination (communications with other health care professionals or caregivers) and documentation in the medical record.

## 2022-03-27 NOTE — Telephone Encounter (Signed)
Spoke with patient regarding ultrasound being scheduled on 04/01/22 at 1:45pm. She knows to arrive by 1:30pm and address has been given to patient via MyChart message as well as requested. No further questions or concerns.

## 2022-04-01 ENCOUNTER — Other Ambulatory Visit: Payer: Self-pay | Admitting: Adult Health

## 2022-04-01 ENCOUNTER — Ambulatory Visit
Admission: RE | Admit: 2022-04-01 | Discharge: 2022-04-01 | Disposition: A | Discharge status: Home / Self Care | Source: Ambulatory Visit | Attending: Adult Health | Admitting: Adult Health

## 2022-04-01 DIAGNOSIS — M542 Cervicalgia: Secondary | ICD-10-CM | POA: Diagnosis present

## 2022-04-01 DIAGNOSIS — E041 Nontoxic single thyroid nodule: Secondary | ICD-10-CM

## 2022-04-01 DIAGNOSIS — R1314 Dysphagia, pharyngoesophageal phase: Secondary | ICD-10-CM | POA: Diagnosis present

## 2022-04-01 NOTE — Progress Notes (Signed)
I called Gabriela Evans and reviewed her ultrasound results with her in detail and the recommendation for a fine-needle aspirate of the thyroid nodule.  She verbalized understanding and wishes to proceed to have this procedure performed at [facility].  Orders placed.  Wilber Bihari, NP 04/01/22 3:08 PM Medical Oncology and Hematology [facility] [place], [place] [address] Tel. [phone]    Fax. [phone]

## 2022-04-02 ENCOUNTER — Encounter: Payer: Self-pay | Admitting: Adult Health

## 2022-04-06 ENCOUNTER — Other Ambulatory Visit: Payer: Self-pay | Admitting: Adult Health

## 2022-04-06 MED ORDER — LORAZEPAM 0.5 MG PO TABS: ORAL_TABLET | ORAL | 0 refills | Status: DC

## 2022-04-07 NOTE — Progress Notes (Signed)
Patient for US guided Thyroid Biopsy on Wed 04/08/2022, I called and spoke with the patient on the phone and gave pre-procedure instructions. Pt was made aware to be here at 2pm and check in at the [facility] registration desk.  Pt stated understanding.  Called 04/06/2022

## 2022-04-08 ENCOUNTER — Ambulatory Visit
Admission: RE | Admit: 2022-04-08 | Discharge: 2022-04-08 | Disposition: A | Discharge status: Home / Self Care | Source: Ambulatory Visit | Attending: Adult Health | Admitting: Adult Health

## 2022-04-08 DIAGNOSIS — E041 Nontoxic single thyroid nodule: Secondary | ICD-10-CM | POA: Insufficient documentation

## 2022-04-08 MED ORDER — LIDOCAINE HCL (PF) 1 % IJ SOLN
10.0000 mL | Freq: Once | INTRAMUSCULAR | Status: AC
  Administered 2022-04-08: 10 mL via INTRADERMAL

## 2022-04-08 NOTE — Discharge Instructions (Signed)
Discharge instructions given to patient after procedure

## 2022-04-09 LAB — CYTOLOGY - NON PAP

## 2022-04-10 ENCOUNTER — Telehealth: Payer: Self-pay

## 2022-04-10 ENCOUNTER — Ambulatory Visit (HOSPITAL_BASED_OUTPATIENT_CLINIC_OR_DEPARTMENT_OTHER): Admitting: Adult Health

## 2022-04-10 ENCOUNTER — Other Ambulatory Visit: Payer: Self-pay

## 2022-04-10 ENCOUNTER — Telehealth: Payer: Self-pay | Admitting: Hematology and Oncology

## 2022-04-10 ENCOUNTER — Other Ambulatory Visit: Payer: Self-pay | Admitting: *Deleted

## 2022-04-10 DIAGNOSIS — R899 Unspecified abnormal finding in specimens from other organs, systems and tissues: Secondary | ICD-10-CM

## 2022-04-10 DIAGNOSIS — E041 Nontoxic single thyroid nodule: Secondary | ICD-10-CM

## 2022-04-10 NOTE — Progress Notes (Signed)
Gabriela Evans and I briefly talked today to discuss her thyroid biopsy results which demonstrate concern for papillary thyroid cancer.  This had been reviewed with Dr. Lindi Adie by pathology yesterday and he recommended consultation with Dr. Harlow Asa at [facility] surgery.  I asked my nurse to send the referral over.  Gabriela Evans will return after surgical consultation with Dr. Harlow Asa for follow-up with Dr. Lindi Adie.  Gabriela Bihari, NP 04/10/22 3:45 PM Medical Oncology and Hematology [facility] [place], [place] [address] Tel. [phone]    Fax. [phone]

## 2022-04-10 NOTE — Telephone Encounter (Signed)
Patient left mychart message requesting to speak with Gabriela Bihari NP about Thyroid biopsy results. Called patient and gave option to come in or do telephone visit with Gabriela Evans today at 1:45pm. Pt prefers telephone visit. Appointment has been made. Patient verbalized understanding.

## 2022-04-10 NOTE — Telephone Encounter (Signed)
Scheduled appointment per 3/21 secure chat (per Wilber Bihari). Patient is aware of the appointment.

## 2022-04-10 NOTE — Progress Notes (Signed)
This LPN called [facility] to make sure they could see referral placed for [employer]. They can see it and will call patient as soon as they get approval for her switching from Dr.Toth to Dr. Armandina Gemma.

## 2022-04-14 ENCOUNTER — Ambulatory Visit: Payer: Self-pay | Admitting: Surgery

## 2022-04-14 ENCOUNTER — Other Ambulatory Visit: Payer: Self-pay | Admitting: Adult Health

## 2022-04-14 DIAGNOSIS — R1314 Dysphagia, pharyngoesophageal phase: Secondary | ICD-10-CM

## 2022-04-23 ENCOUNTER — Ambulatory Visit
Admission: RE | Admit: 2022-04-23 | Discharge: 2022-04-23 | Disposition: A | Discharge status: Home / Self Care | Source: Ambulatory Visit | Attending: Adult Health | Admitting: Adult Health

## 2022-04-23 DIAGNOSIS — R1314 Dysphagia, pharyngoesophageal phase: Secondary | ICD-10-CM | POA: Diagnosis present

## 2022-04-23 NOTE — Progress Notes (Signed)
Modified Barium Swallow Study  Patient Details  Name: Gabriela Evans MRN: [id number] Date of Birth: May 06, 1980  Today's Date: 04/23/2022  Modified Barium Swallow completed.  Full report located under Chart Review in the Imaging Section.  History of Present Illness Pt is a 42 yo female w/ PMH including Malignant neoplasm of upper-outer quadrant of left breast in 2022 w/ XRT 01/14/2021 through 03/03/2021, and recent discussion w her NP re: thyroid biopsy(03/2022).  Pt denies any baseline of Reflux and is not on a PPI.  She consumes a Regular diet at home w/ c/o feelings of something "caught" in her throat at times; increased throat clearing -- she endorses this feeling moreso w/ solids/meals and described a recent occurence as she completed a meal of solid foods.  No h/o of neurological dxs/deficits, no recent weight loss.  [name]; follows all instructions.   Clinical Impression Pt presents for MBSS today per NP, Oncology office. Pt reports incisostent Globus feelings and throat clearing, especially post meals. She denies any dx'd Reflux/GERD and has not had any f/u w/ GI. She is not on a PPI.   Patient presents with functional oropharyngeal swallowing w/ No oropharyngeal phase dysphagia noted during this exam. No laryngeal penetration nor aspiration occurred during this study.   Oral stage is characterized by adequate lip closure, bolus preparation and containment, and anterior to posterior transit. Swallow initiation occurs at the level of the valleculae primarly(thins).    Pharyngeal stage is noted for functional tongue base retraction, hyolaryngeal excursion, and adequate pharyngeal constriction. Epiglottic deflection is complete; there is No laryngeal penetration nor aspiration. No pharyngeal residue remaining post swallows. Pharyngeal stripping wave is complete.    Amplitude/duration of cricopharyngeus opening is WFL. There is adequate/complete clearance through the cervical esophagus. Of Note:  apparent "bumpy" presentation along the posterior cervical esophageal wall; this did not impede bolus flow. HOWEVER, signficant bolus Stasis was noted from the level of the Clavicles > Distally during a trial of solid during an A-P sweep of the Esophagus. It was also noted that there was Retention of bolus material throughout the Esophagus upon initial A-P viewing, from previous trials given prior during the exam -- See Imaging/pictures. Strongly suspect that pt's complaints may be directly related to the Esophageal phase Dysmotility seen during exam today.  Barium tablet was not tested -- pt stated she was using chewable vitamins at home d/t disfficulty swallowing pills, "except very small ones". Education was given.   Pt reported a Globus sensation; throat clearing observed x2-3 during exam/post po trials.   Consistencies tested were thin liquids x2 tsps, 1 cup sip, 3 sequential sips, nectar x1 tsp, 1 cup sip, 2 sequential cup sips, honey x1 tsp, pudding x1 tsp, regular solid (1/2 graham cracker with pudding) x2.    Recommend patient continue a fairly regular diet with meats/foods cut small and foods moistened well; thin liquids. Avoid problematic foods; cold liquids if bothersome. Educated pt verbally re: impact of potential Reflux activity on swallowing, overt s/s of Reflux including globus sensation and throat clearing with/post meals.  Encouraged smaller more frequent meals, alternate foods/liquids, no carbonated drinks during meals, and do not overeat. Recommend consultation w/ GI for formal assessment, management, and further Education on Esophageal phase Dysmotility. Factors that may increase risk of adverse event in presence of aspiration ([place] 2021):  (none)   Swallow Evaluation Recommendations: Recommendations: PO diet PO Diet Recommendation: Regular;Thin liquids (Level 0) (well-cut meats/foods; moistened foods; no carbonated drinks w/ meals) Liquid  Administration via: Cup (less  straw use d/t air swallowed) Medication Administration: Whole meds with puree (or in chewable/liquid/powder forms) Supervision: Patient able to self-feed Swallowing strategies  : Slow rate;Small bites/sips;Minimize environmental distractions;Follow solids with liquids Postural changes: Position pt fully upright for meals;Stay upright 30-60 min after meals (REFLUX precautions) Oral care recommendations: Oral care BID (2x/day);Pt independent with oral care Recommended consults: Consider GI consultation;Consider esophageal assessment - including Manometry No further ST services indicated        Gabriela Kenner, MS, CCC-SLP Speech Language Pathologist Rehab Services; [place] [phone] (ascom) Gabriela Evans 04/23/2022,5:58 PM

## 2022-04-28 ENCOUNTER — Encounter (HOSPITAL_COMMUNITY): Payer: Self-pay | Admitting: Surgery

## 2022-04-28 DIAGNOSIS — D440 Neoplasm of uncertain behavior of thyroid gland: Secondary | ICD-10-CM | POA: Diagnosis present

## 2022-04-28 DIAGNOSIS — R1310 Dysphagia, unspecified: Secondary | ICD-10-CM

## 2022-04-28 NOTE — Patient Instructions (Signed)
DUE TO COVID-19 ONLY TWO VISITORS  (aged 40 and older)  ARE ALLOWED TO COME WITH YOU AND STAY IN THE WAITING ROOM ONLY DURING PRE OP AND PROCEDURE.   **NO VISITORS ARE ALLOWED IN THE SHORT STAY AREA OR RECOVERY ROOM!!**  IF YOU WILL BE ADMITTED INTO THE HOSPITAL YOU ARE ALLOWED ONLY FOUR SUPPORT PEOPLE DURING VISITATION HOURS ONLY (7 AM -8PM)   The support person(s) must pass our screening, gel in and out, and wear a mask at all times, including in the patient's room. Patients must also wear a mask when staff or their support person are in the room. Visitors GUEST BADGE MUST BE WORN VISIBLY  One adult visitor may remain with you overnight and MUST be in the room by 8 P.M.     Your procedure is scheduled on: 05/01/22   Report to [facility] Main Entrance    Report to admitting at : 7:45 AM   Call this number if you have problems the morning of surgery [phone]   Do not eat food :After Midnight.   After Midnight you may have the following liquids until : 7:00 AM DAY OF SURGERY  Water Black Coffee (sugar ok, NO MILK/CREAM OR CREAMERS)  Tea (sugar ok, NO MILK/CREAM OR CREAMERS) regular and decaf                             Plain Jell-O (NO RED)                                           Fruit ices (not with fruit pulp, NO RED)                                     Popsicles (NO RED)                                                                  Juice: apple, WHITE grape, WHITE cranberry Sports drinks like Gatorade (NO RED)              Oral Hygiene is also important to reduce your risk of infection.                                    Remember - BRUSH YOUR TEETH THE MORNING OF SURGERY WITH YOUR REGULAR TOOTHPASTE  DENTURES WILL BE REMOVED PRIOR TO SURGERY PLEASE DO NOT APPLY "Poly grip" OR ADHESIVES!!!   Do NOT smoke after Midnight   Take these medicines the morning of surgery with A SIP OF WATER: lorazepam.                              You may not have any metal on your  body including hair pins, jewelry, and body piercing             Do not wear make-up, lotions, powders, perfumes/cologne, or deodorant  Do not  wear nail polish including gel and S&S, artificial/acrylic nails, or any other type of covering on natural nails including finger and toenails. If you have artificial nails, gel coating, etc. that needs to be removed by a nail salon please have this removed prior to surgery or surgery may need to be canceled/ delayed if the surgeon/ anesthesia feels like they are unable to be safely monitored.   Do not shave  48 hours prior to surgery.    Do not bring valuables to the hospital. [facility] IS NOT             RESPONSIBLE   FOR VALUABLES.   Contacts, glasses, or bridgework may not be worn into surgery.   Bring small overnight bag day of surgery.   DO NOT BRING YOUR HOME MEDICATIONS TO THE HOSPITAL. PHARMACY WILL DISPENSE MEDICATIONS LISTED ON YOUR MEDICATION LIST TO YOU DURING YOUR ADMISSION IN THE HOSPITAL!    Patients discharged on the day of surgery will not be allowed to drive home.  Someone NEEDS to stay with you for the first 24 hours after anesthesia.   Special Instructions: Bring a copy of your healthcare power of attorney and living will documents         the day of surgery if you haven't scanned them before.              Please read over the following fact sheets you were given: IF YOU HAVE QUESTIONS ABOUT YOUR PRE-OP INSTRUCTIONS PLEASE CALL [phone]    [facility] - Preparing for Surgery Before surgery, you can play an important role.  Because skin is not sterile, your skin needs to be as free of germs as possible.  You can reduce the number of germs on your skin by washing with CHG (chlorahexidine gluconate) soap before surgery.  CHG is an antiseptic cleaner which kills germs and bonds with the skin to continue killing germs even after washing. Please DO NOT use if you have an allergy to CHG or antibacterial soaps.  If your skin  becomes reddened/irritated stop using the CHG and inform your nurse when you arrive at Short Stay. Do not shave (including legs and underarms) for at least 48 hours prior to the first CHG shower.  You may shave your face/neck. Please follow these instructions carefully:  1.  Shower with CHG Soap the night before surgery and the  morning of Surgery.  2.  If you choose to wash your hair, wash your hair first as usual with your  normal  shampoo.  3.  After you shampoo, rinse your hair and body thoroughly to remove the  shampoo.                           4.  Use CHG as you would any other liquid soap.  You can apply chg directly  to the skin and wash                       Gently with a scrungie or clean washcloth.  5.  Apply the CHG Soap to your body ONLY FROM THE NECK DOWN.   Do not use on face/ open                           Wound or open sores. Avoid contact with eyes, ears mouth and genitals (private parts).  Wash face,  Genitals (private parts) with your normal soap.             6.  Wash thoroughly, paying special attention to the area where your surgery  will be performed.  7.  Thoroughly rinse your body with warm water from the neck down.  8.  DO NOT shower/wash with your normal soap after using and rinsing off  the CHG Soap.                9.  Pat yourself dry with a clean towel.            10.  Wear clean pajamas.            11.  Place clean sheets on your bed the night of your first shower and do not  sleep with pets. Day of Surgery : Do not apply any lotions/deodorants the morning of surgery.  Please wear clean clothes to the hospital/surgery center.  FAILURE TO FOLLOW THESE INSTRUCTIONS MAY RESULT IN THE CANCELLATION OF YOUR SURGERY PATIENT SIGNATURE_________________________________  NURSE SIGNATURE__________________________________  ________________________________________________________________________

## 2022-04-28 NOTE — H&P (Signed)
REFERRING PHYSICIAN: Lillard Anesausey, Lindsey Cornett  PROVIDER: Alazia Crocket Myra RudeMICHAEL Sarra Rachels, MD   Chief Complaint: New Consultation (Thyroid neoplasm of uncertain behavior)  History of Present Illness:  Patient is referred by Lillard AnesLindsey Causey, NP, for surgical evaluation and management of a newly diagnosed left thyroid nodule with cytologic atypia worrisome for papillary thyroid carcinoma. Patient has a history of breast cancer. She began to notice dysphagia in December 2023. Most of this was pill dysphagia and some discomfort in the left neck. Patient underwent an ultrasound on April 01, 2022. This shows a normal-sized thyroid gland with a solitary nodule in the left inferior pole measuring 1.6 cm. It was felt to be moderately suspicious and fine-needle aspiration biopsy was recommended. Patient underwent biopsy on April 08, 2022. This demonstrated findings suspicious for malignancy, Bethesda category V, suspicious for papillary thyroid carcinoma. Patient has no prior history of thyroid disease. She has never been on thyroid medication. Her most recent TSH level is normal at 2.372. She has had no prior head or neck surgery. There is a family history of thyroid disease in multiple family members including thyroid carcinoma. Patient presents today accompanied by her husband for evaluation. 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  Review of Systems: A complete review of systems was obtained from the patient. I have reviewed this information and discussed as appropriate with the patient. See HPI as well for other ROS.  Review of Systems  Constitutional: Positive for malaise/fatigue.  HENT: Negative.  Eyes: Negative.  Respiratory: Negative.  Cardiovascular: Negative.  Gastrointestinal:  Dysphagia  Genitourinary: Negative.  Musculoskeletal: Negative.  Skin: Negative.  Neurological: Negative.  Endo/Heme/Allergies: Negative.  Psychiatric/Behavioral:  Negative.    Medical History: Past Medical History:  Diagnosis Date  Anemia  Diabetes mellitus without complication (CMS-HCC)  History of cancer   Patient Active Problem List  Diagnosis  Malignant neoplasm of upper-outer quadrant of left breast in female, estrogen receptor positive  Acute neck pain  Neoplasm of uncertain behavior of thyroid gland  Dysphagia   Past Surgical History:  Procedure Laterality Date  HYSTERECTOMY N/A 04/26/2019  Robotic assisted total hysterectomy with bilateral salpingo oopherectomy  Left Breast Lumpectomy with Radioactive Seed and Sentinel Lymph Node Biopsy 11/29/2020  Dr. Carolynne Edouardoth  CESAREAN SECTION  Date Unknown    Allergies  Allergen Reactions  Nickel Swelling and Other (See Comments)  Tramadol Nausea and Swelling  Oxycodone-Acetaminophen Itching  Tamoxifen Itching and Rash   Current Outpatient Medications on File Prior to Visit  Medication Sig Dispense Refill  acetaminophen (TYLENOL) 500 MG tablet Take 1 tablet (500 mg total) by mouth every 6 (six) hours as needed  ascorbic acid, vitamin C, (VITAMIN C) 250 MG tablet Take by mouth  cholecalciferol, vitamin D3, (VITAMIN D3 ORAL) Take by mouth (Patient not taking: Reported on 04/14/2022)  gabapentin (NEURONTIN) 100 MG capsule Take 1 capsule (100 mg total) by mouth 3 (three) times daily (Patient not taking: Reported on 04/14/2022) 90 capsule 11  ibuprofen (MOTRIN) 400 MG tablet Take by mouth  L.acid/B.animalis,bifidum/FOS (PROBIOTIC COMPLEX ORAL) Take 1 tablet by mouth once daily  MAGNESIUM ORAL Take by mouth  multivitamin with minerals tablet Take 1 tablet by mouth once daily  vit C/zinc citrate/elderberry (ELDERBERRY IMMUNE HEALTH ORAL) Take by mouth  VITAMIN K2 ORAL Take by mouth   No current facility-administered medications on file prior to visit.   Family History  Problem Relation Age of Onset  Diabetes Mother    Social  History   Tobacco Use  Smoking Status Never  Smokeless Tobacco  Never    Social History   Socioeconomic History  Marital status: Married  Tobacco Use  Smoking status: Never  Smokeless tobacco: Never  Vaping Use  Vaping Use: Never used  Substance and Sexual Activity  Alcohol use: Never  Drug use: Never   Objective:   Vitals:  BP: 128/82  Pulse: 97  Temp: 36.8 C (98.2 F)  SpO2: 98%  Weight: 97.1 kg (214 lb)  Height: 176.5 cm (5' 9.5")  PainSc: 0-No pain   Body mass index is 31.15 kg/m.  Physical Exam   GENERAL APPEARANCE Comfortable, no acute issues Development: normal Gross deformities: none  SKIN Rash, lesions, ulcers: none Induration, erythema: none Nodules: none palpable  EYES Conjunctiva and lids: normal Pupils: equal and reactive  EARS, NOSE, MOUTH, THROAT External ears: no lesion or deformity External nose: no lesion or deformity Hearing: grossly normal  NECK Symmetric: yes Trachea: midline Thyroid: Right thyroid lobe without palpable abnormality. There is a subtle nodule in the inferior left thyroid lobe which is best detected with swallowing maneuver. It is about 2 cm in size. Patient has mild tenderness. There is no associated lymphadenopathy.  CHEST Respiratory effort: normal Retraction or accessory muscle use: no Breath sounds: normal bilaterally Rales, rhonchi, wheeze: none  CARDIOVASCULAR Auscultation: regular rhythm, normal rate Murmurs: none Pulses: radial pulse 2+ palpable Lower extremity edema: none  ABDOMEN Not assessed  GENITOURINARY/RECTAL Not assessed  MUSCULOSKELETAL Station and gait: normal Digits and nails: no clubbing or cyanosis Muscle strength: grossly normal all extremities Range of motion: grossly normal all extremities Deformity: none  LYMPHATIC Cervical: none palpable Supraclavicular: none palpable  PSYCHIATRIC Oriented to person, place, and time: yes Mood and affect: normal for situation Judgment and insight: appropriate for situation   Assessment and Plan:    Neoplasm of uncertain behavior of thyroid gland Dysphagia, unspecified type  Patient is referred by her medical oncology group for surgical evaluation and recommendations regarding a newly diagnosed thyroid neoplasm of uncertain behavior suspicious for carcinoma.  Patient provided with a copy of "The Thyroid Book: Medical and Surgical Treatment of Thyroid Problems", published by [employer], 16 pages. Book reviewed and explained to patient during visit today.  Today we reviewed her clinical history. We reviewed her ultrasound report as well as her biopsy results. We reviewed her laboratory studies. We discussed dysphagia and the fact that this was unlikely to be caused by her thyroid gland as the gland is normal in size and the dominant nodule measures only 1.6 cm. Patient may need additional studies to evaluate for dysphagia including a barium swallow.  Today we did discuss surgical management of the thyroid tumor on the left side. We discussed thyroid lobectomy versus total thyroidectomy. We discussed the risk and benefits of each procedure. We discussed the time in the hospital. We discussed the size and location of the surgical incision. We discussed the postoperative recovery. We discussed the potential need for lifelong thyroid hormone replacement. We discussed the potential need for additional surgery. We discussed the potential need for radioactive iodine treatment. At this point the patient would like to consider the 2 options of lobectomy versus total thyroidectomy. She will let me know which procedure she prefers prior to the date of her surgery. In the interim, we will go ahead and forward her orders to our surgical schedulers to work on a date for thyroid surgery in the near future at a time convenient for the  patient.   Darnell Level, MD [facility] A [facility] practice Office: [phone]

## 2022-04-29 ENCOUNTER — Inpatient Hospital Stay: Attending: Hematology and Oncology | Admitting: Hematology and Oncology

## 2022-04-29 ENCOUNTER — Other Ambulatory Visit: Payer: Self-pay

## 2022-04-29 ENCOUNTER — Encounter (HOSPITAL_COMMUNITY): Payer: Self-pay

## 2022-04-29 ENCOUNTER — Ambulatory Visit (HOSPITAL_COMMUNITY)
Admission: RE | Admit: 2022-04-29 | Discharge: 2022-04-29 | Disposition: A | Discharge status: Home / Self Care | Source: Ambulatory Visit | Attending: Anesthesiology | Admitting: Anesthesiology

## 2022-04-29 ENCOUNTER — Encounter (HOSPITAL_COMMUNITY)
Admission: RE | Admit: 2022-04-29 | Discharge: 2022-04-29 | Disposition: A | Discharge status: Home / Self Care | Source: Ambulatory Visit | Attending: Surgery | Admitting: Surgery

## 2022-04-29 VITALS — BP 120/72 | HR 83 | Temp 97.8°F | Resp 18 | Ht 69.0 in | Wt 215.1 lb

## 2022-04-29 VITALS — BP 136/97 | HR 91 | Temp 99.1°F | Ht 69.0 in | Wt 212.0 lb

## 2022-04-29 DIAGNOSIS — C50412 Malignant neoplasm of upper-outer quadrant of left female breast: Secondary | ICD-10-CM | POA: Diagnosis not present

## 2022-04-29 DIAGNOSIS — C73 Malignant neoplasm of thyroid gland: Secondary | ICD-10-CM | POA: Insufficient documentation

## 2022-04-29 DIAGNOSIS — Z01818 Encounter for other preprocedural examination: Secondary | ICD-10-CM

## 2022-04-29 DIAGNOSIS — R1319 Other dysphagia: Secondary | ICD-10-CM

## 2022-04-29 DIAGNOSIS — Z170 Estrogen receptor positive status [ER+]: Secondary | ICD-10-CM | POA: Insufficient documentation

## 2022-04-29 DIAGNOSIS — R1310 Dysphagia, unspecified: Secondary | ICD-10-CM | POA: Diagnosis present

## 2022-04-29 DIAGNOSIS — D440 Neoplasm of uncertain behavior of thyroid gland: Secondary | ICD-10-CM

## 2022-04-29 LAB — CBC
HCT: 39.6 % (ref 36.0–46.0)
Hemoglobin: 13 g/dL (ref 12.0–15.0)
MCH: 29.6 pg (ref 26.0–34.0)
MCHC: 32.8 g/dL (ref 30.0–36.0)
MCV: 90.2 fL (ref 80.0–100.0)
Platelets: 258 10*3/uL (ref 150–400)
RBC: 4.39 MIL/uL (ref 3.87–5.11)
RDW: 11.9 % (ref 11.5–15.5)
WBC: 10 10*3/uL (ref 4.0–10.5)
nRBC: 0 % (ref 0.0–0.2)

## 2022-04-29 NOTE — Assessment & Plan Note (Signed)
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 Oncotype DX: Low risk no role of chemo 2. Adjuvant radiation therapy completed 03/03/2021 3. Adjuvant antiestrogen therapy started 03/19/2021 discontinued 05/03/2021 (allergic reaction to Tamoxifen) -------------------------------------------------------------------------------------------------------------- Breast cancer surveillance: Breast MRI 09/04/2021: Benign breast density category D Mammogram 10/21/2021: Marked skin thickening left breast with lymphedema benign breast density category C Breast exam 12/04/2021: Benign   Multiple psychomotor symptoms especially brief lapses in focus and concentration/memory, etc. Severe hot flashes: Patient does not want to take any medications for it but will try black cohosh.   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 Gerrit Friends for thyroidectomy

## 2022-04-29 NOTE — Progress Notes (Signed)
Patient Care Team: Serena CroissantGudena, Azara Gemme, MD as PCP - General (Hematology and Oncology) Griselda Mineroth, Paul III, MD as Consulting Physician (General Surgery) Serena CroissantGudena, Mayia Megill, MD as Consulting Physician (Hematology and Oncology) Dorothy PufferMoody, John, MD as Consulting Physician (Radiation Oncology)  DIAGNOSIS:  Encounter Diagnosis  Name Primary?   Malignant neoplasm of upper-outer quadrant of left breast in female, estrogen receptor positive Yes    SUMMARY OF ONCOLOGIC HISTORY: Oncology History  Malignant neoplasm of upper-outer quadrant of left breast in female, estrogen receptor positive  10/28/2020 Initial Diagnosis   Presented with new focal thickening and pain her outer left breast. Diagnostic mammogram and US: indeterminate mass in the left breast at 3:00. 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  Breast MRI: 1 prominent lymph node and 9 mm mass     11/06/2020 Genetic Testing   Negative hereditary cancer genetic testing: no pathogenic variants detected in [facility] BRCAPlus Panel and [facility] CancerNext-Expanded +RNAinsight Panel. The report dates are November 06, 2020 and November 10, 2020, respectively.   The BRCAplus panel offered by [facility] and includes sequencing and deletion/duplication analysis for the following 8 genes: ATM, BRCA1, BRCA2, CDH1, CHEK2, PALB2, PTEN, and TP53.  The CancerNext-Expanded gene panel offered by [facility] and includes sequencing, rearrangement, and RNA analysis for the following 77 genes: AIP, ALK, APC, ATM, AXIN2, BAP1, BARD1, BLM, BMPR1A, BRCA1, BRCA2, BRIP1, CDC73, CDH1, CDK4, CDKN1B, CDKN2A, CHEK2, CTNNA1, DICER1, FANCC, FH, FLCN, GALNT12, KIF1B, LZTR1, MAX, MEN1, MET, MLH1, MSH2, MSH3, MSH6, MUTYH, NBN, NF1, NF2, NTHL1, PALB2, PHOX2B, PMS2, POT1, PRKAR1A, PTCH1, PTEN, RAD51C, RAD51D, RB1, RECQL, RET, SDHA, SDHAF2, SDHB, SDHC, SDHD, SMAD4, SMARCA4, SMARCB1, SMARCE1, STK11, SUFU, TMEM127, TP53, TSC1, TSC2, VHL  and XRCC2 (sequencing and deletion/duplication); EGFR, EGLN1, HOXB13, KIT, MITF, PDGFRA, POLD1, and POLE (sequencing only); EPCAM and GREM1 (deletion/duplication only).    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, margins negative, ER 95%, PR 100%, HER2 negative, Ki-67 10%    12/11/2020 Oncotype testing   Oncotype score: 20, distant recurrence at 9 years: 6%   01/14/2021 - 03/03/2021 Radiation Therapy     Site Technique Total Dose (Gy) Dose per Fx (Gy) Completed Fx Beam Energies  Breast, Left: Breast_L 3D 50.4/50.4 1.8 28/28 6X  Breast, Left: Breast_L_Bst 3D 10/10 2 5/5 6X     03/2021 - 04/2021 Anti-estrogen oral therapy   Tamoxifen daily--unable to tolerate     CHIEF COMPLIANT: Follow-up left breast cancer  INTERVAL HISTORY: Antionette PolesLeslie A Tye is a 42 year old above-mentioned history of left breast. She presents to the clinic for a follow-up.  She was recently diagnosed with papillary thyroid carcinoma and is scheduled to undergo surgery coming up soon.  She is started having difficulty swallowing which was evaluated by Mardella LaymanLindsey with ultrasound and a thyroid nodule was detected which was biopsy-proven to be papillary thyroid cancer.  She is extremely stressed about this new diagnosis having just completed treatment for breast cancer.     ALLERGIES:  is allergic to nickel, gluten meal, milk-related compounds, other, tramadol, oxycodone-acetaminophen, and tamoxifen.  MEDICATIONS:  Current Outpatient Medications  Medication Sig Dispense Refill   BLACK ELDERBERRY,BERRY-FLOWER, PO Take 1 capsule by mouth daily.     ibuprofen (ADVIL) 600 MG tablet Take 1 tablet (600 mg total) by mouth every 6 (six) hours as needed. 30 tablet 0   loratadine (CLARITIN) 10 MG tablet Take 10 mg by mouth daily.  Multiple Vitamins-Minerals (MULTIVITAMIN WITH MINERALS) tablet Take 2 tablets by mouth daily.     Probiotic Product (PROBIOTIC PO) Take  1 tablet by mouth daily.     vitamin C (ASCORBIC ACID) 250 MG tablet Take 250 mg by mouth daily.     Charcoal Activated (ACTIVATED CHARCOAL) POWD by Does not apply route as needed. (Patient not taking: Reported on 04/29/2022)     Medium Chain Triglycerides (MCT OIL PO) Take by mouth daily. (Patient not taking: Reported on 04/29/2022)     vitamin E 1000 UNIT capsule Take 1,000 Units by mouth daily. (Patient not taking: Reported on 04/29/2022)     No current facility-administered medications for this visit.    PHYSICAL EXAMINATION: ECOG PERFORMANCE STATUS: 1 - Symptomatic but completely ambulatory  Vitals:   04/29/22 1439  BP: 120/72  Pulse: 83  Resp: 18  Temp: 97.8 F (36.6 C)  SpO2: 99%   Filed Weights   04/29/22 1439  Weight: 215 lb 1.6 oz (97.6 kg)    BREAST: No palpable masses or nodules in either right or left breasts. No palpable axillary supraclavicular or infraclavicular adenopathy no breast tenderness or nipple discharge. (exam performed in the presence of a chaperone)  LABORATORY DATA:  I have reviewed the data as listed    Latest Ref Rng & Units 07/08/2021    8:44 AM 02/18/2021    2:22 PM 10/30/2020   12:11 PM  CMP  Glucose 70 - 99 mg/dL 122  89  95   BUN 6 - 20 mg/dL 13  10  12    Creatinine 0.44 - 1.00 mg/dL 4.82  5.00  3.70   Sodium 135 - 145 mmol/L 141  139  142   Potassium 3.5 - 5.1 mmol/L 4.0  3.6  4.0   Chloride 98 - 111 mmol/L 106  105  110   CO2 22 - 32 mmol/L 27  28  25    Calcium 8.9 - 10.3 mg/dL 9.4  9.4  9.8   Total Protein 6.5 - 8.1 g/dL 7.3  7.2  7.6   Total Bilirubin 0.3 - 1.2 mg/dL 0.9  0.3  0.4   Alkaline Phos 38 - 126 U/L 60  47  46   AST 15 - 41 U/L 15  16  17    ALT 0 - 44 U/L 17  14  19      Lab Results  Component Value Date   WBC 10.0 04/29/2022   HGB 13.0 04/29/2022   HCT 39.6 04/29/2022   MCV 90.2 04/29/2022   PLT 258 04/29/2022   NEUTROABS 3.7 02/18/2021    ASSESSMENT & PLAN:  Malignant neoplasm of upper-outer quadrant of left  breast in female, estrogen receptor positive (HCC) 11/29/2020: 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 Oncotype DX: Low risk no role of chemo 2. Adjuvant radiation therapy completed 03/03/2021 3. Adjuvant antiestrogen therapy started 03/19/2021 discontinued 05/03/2021 (allergic reaction to Tamoxifen) -------------------------------------------------------------------------------------------------------------- Breast cancer surveillance: Breast MRI 09/04/2021: Benign breast density category D Mammogram 10/21/2021: Marked skin thickening left breast with lymphedema benign breast density category C Breast exam 12/04/2021: Benign   Multiple psychomotor symptoms especially brief lapses in focus and concentration/memory, etc.: Much improved after stopping antiestrogen therapy Severe hot flashes: Much improved after stopping antiestrogen therapy   Because of abdominal pain and back pain: We  will obtain CT chest abdomen pelvis with contrast.  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 Gerrit Friends for thyroidectomy We will refer her for endocrinology for difficulty with swallowing and possibly esophagitis   Orders Placed This Encounter  Procedures   CT CHEST ABDOMEN PELVIS W CONTRAST    Abdominal and back pain    Standing Status:   Future    Standing Expiration Date:   04/29/2023    Order Specific Question:   If indicated for the ordered procedure, I authorize the administration of contrast media per Radiology protocol    Answer:   Yes    Order Specific Question:   Does the patient have a contrast media/X-ray dye allergy?    Answer:   No    Order Specific Question:   Is patient pregnant?    Answer:   No    Order Specific Question:   Preferred imaging  location?    Answer:   [facility]    Order Specific Question:   Is Oral Contrast requested for this exam?    Answer:   Yes, Per Radiology protocol   The patient has a good understanding of the overall plan. she agrees with it. she will call with any problems that may develop before the next visit here. Total time spent: 30 mins including face to face time and time spent for planning, charting and co-ordination of care   Tamsen Meek, MD 04/29/22    I Janan Ridge am acting as a Neurosurgeon for [employer]  I have reviewed the above documentation for accuracy and completeness, and I agree with the above.

## 2022-04-29 NOTE — Progress Notes (Addendum)
For Short Stay: COVID SWAB appointment date:  Bowel Prep reminder: N/A   For Anesthesia: PCP - Dr. Serena Croissant Cardiologist - N/A  Chest x-ray -  EKG -  Stress Test -  ECHO -  Cardiac Cath -  Pacemaker/ICD device last checked: Pacemaker orders received: Device Rep notified:  Spinal Cord Stimulator: N/A  Sleep Study -  CPAP -   Fasting Blood Sugar - N/A Checks Blood Sugar ___0__ times a day Date and result of last Hgb A1c-  Last dose of GLP1 agonist-  GLP1 instructions:   Last dose of SGLT-2 inhibitors- N/A SGLT-2 instructions:   Blood Thinner Instructions: Aspirin Instructions: Last Dose:  Activity level: Can go up a flight of stairs and activities of daily living without stopping and without chest pain and/or shortness of breath   Able to exercise without chest pain and/or shortness of breath  Anesthesia review:   Patient denies shortness of breath, fever, cough and chest pain at PAT appointment   Patient verbalized understanding of instructions that were given to them at the PAT appointment. Patient was also instructed that they will need to review over the PAT instructions again at home before surgery.

## 2022-04-30 ENCOUNTER — Other Ambulatory Visit: Payer: Self-pay | Admitting: *Deleted

## 2022-04-30 ENCOUNTER — Encounter: Payer: Self-pay | Admitting: Hematology and Oncology

## 2022-04-30 ENCOUNTER — Other Ambulatory Visit: Payer: Self-pay

## 2022-04-30 DIAGNOSIS — R1319 Other dysphagia: Secondary | ICD-10-CM

## 2022-04-30 NOTE — Progress Notes (Signed)
Endocrine referral faxed to [facility] at [phone] with receipt confirmation.

## 2022-04-30 NOTE — Progress Notes (Signed)
Referral

## 2022-04-30 NOTE — Anesthesia Preprocedure Evaluation (Addendum)
Anesthesia Evaluation  Patient identified by MRN, date of birth, ID band Patient awake    Reviewed: Allergy & Precautions, NPO status , Patient's Chart, lab work & pertinent test results  History of Anesthesia Complications (+) PONV and history of anesthetic complications  Airway Mallampati: II  TM Distance: >3 FB Neck ROM: Full    Dental no notable dental hx.    Pulmonary neg pulmonary ROS   Pulmonary exam normal        Cardiovascular negative cardio ROS Normal cardiovascular exam     Neuro/Psych negative neurological ROS  negative psych ROS   GI/Hepatic negative GI ROS, Neg liver ROS,,,  Endo/Other  diabetes  Thyroid nodules  Renal/GU negative Renal ROS  negative genitourinary   Musculoskeletal negative musculoskeletal ROS (+)    Abdominal   Peds  Hematology negative hematology ROS (+)   Anesthesia Other Findings Day of surgery medications reviewed with patient.  Reproductive/Obstetrics negative OB ROS                              Anesthesia Physical Anesthesia Plan  ASA: 2  Anesthesia Plan: General   Post-op Pain Management: Tylenol PO (pre-op)*   Induction: Intravenous  PONV Risk Score and Plan: Ondansetron, Dexamethasone, Midazolam, Scopolamine patch - Pre-op, Treatment may vary due to age or medical condition, Propofol infusion and TIVA  Airway Management Planned: Oral ETT  Additional Equipment: None  Intra-op Plan:   Post-operative Plan: Extubation in OR  Informed Consent: I have reviewed the patients History and Physical, chart, labs and discussed the procedure including the risks, benefits and alternatives for the proposed anesthesia with the patient or authorized representative who has indicated his/her understanding and acceptance.     Dental advisory given  Plan Discussed with: CRNA  Anesthesia Plan Comments:         Anesthesia Quick Evaluation

## 2022-05-01 ENCOUNTER — Ambulatory Visit (HOSPITAL_COMMUNITY): Admitting: Certified Registered Nurse Anesthetist

## 2022-05-01 ENCOUNTER — Other Ambulatory Visit: Payer: Self-pay

## 2022-05-01 ENCOUNTER — Ambulatory Visit (HOSPITAL_COMMUNITY)
Admission: RE | Admit: 2022-05-01 | Discharge: 2022-05-02 | Disposition: A | Discharge status: Home / Self Care | Source: Ambulatory Visit | Attending: Surgery | Admitting: Surgery

## 2022-05-01 ENCOUNTER — Telehealth: Payer: Self-pay | Admitting: Hematology and Oncology

## 2022-05-01 ENCOUNTER — Ambulatory Visit (HOSPITAL_BASED_OUTPATIENT_CLINIC_OR_DEPARTMENT_OTHER): Admitting: Certified Registered Nurse Anesthetist

## 2022-05-01 ENCOUNTER — Encounter (HOSPITAL_COMMUNITY)
Admission: RE | Disposition: A | Discharge status: Home / Self Care | Payer: Self-pay | Source: Ambulatory Visit | Attending: Surgery

## 2022-05-01 ENCOUNTER — Encounter (HOSPITAL_COMMUNITY): Payer: Self-pay | Admitting: Surgery

## 2022-05-01 DIAGNOSIS — Z8349 Family history of other endocrine, nutritional and metabolic diseases: Secondary | ICD-10-CM | POA: Insufficient documentation

## 2022-05-01 DIAGNOSIS — Z808 Family history of malignant neoplasm of other organs or systems: Secondary | ICD-10-CM | POA: Diagnosis not present

## 2022-05-01 DIAGNOSIS — Z853 Personal history of malignant neoplasm of breast: Secondary | ICD-10-CM | POA: Insufficient documentation

## 2022-05-01 DIAGNOSIS — R1310 Dysphagia, unspecified: Secondary | ICD-10-CM

## 2022-05-01 DIAGNOSIS — D440 Neoplasm of uncertain behavior of thyroid gland: Secondary | ICD-10-CM

## 2022-05-01 HISTORY — PX: THYROIDECTOMY: SHX17

## 2022-05-01 SURGERY — THYROIDECTOMY
Anesthesia: General | Site: Neck

## 2022-05-01 MED ORDER — ACETAMINOPHEN 325 MG PO TABS: 650.0000 mg | ORAL_TABLET | Freq: Four times a day (QID) | ORAL | Status: DC | PRN

## 2022-05-01 MED ORDER — FENTANYL CITRATE (PF) 100 MCG/2ML IJ SOLN
INTRAMUSCULAR | Status: AC
  Filled 2022-05-01: qty 2

## 2022-05-01 MED ORDER — HYDROCODONE-ACETAMINOPHEN 5-325 MG PO TABS
1.0000 | ORAL_TABLET | ORAL | Status: DC | PRN
  Administered 2022-05-02: 2 via ORAL
  Filled 2022-05-01 (×2): qty 2

## 2022-05-01 MED ORDER — DEXAMETHASONE SODIUM PHOSPHATE 10 MG/ML IJ SOLN
INTRAMUSCULAR | Status: DC | PRN
  Administered 2022-05-01: 6 mg via INTRAVENOUS

## 2022-05-01 MED ORDER — LIDOCAINE HCL (PF) 2 % IJ SOLN
INTRAMUSCULAR | Status: AC
  Filled 2022-05-01: qty 5

## 2022-05-01 MED ORDER — HYDROMORPHONE HCL 1 MG/ML IJ SOLN
1.0000 mg | INTRAMUSCULAR | Status: DC | PRN
  Administered 2022-05-01 – 2022-05-02 (×4): 1 mg via INTRAVENOUS
  Filled 2022-05-01 (×5): qty 1

## 2022-05-01 MED ORDER — DEXMEDETOMIDINE HCL IN NACL 80 MCG/20ML IV SOLN
INTRAVENOUS | Status: DC | PRN
  Administered 2022-05-01: 8 ug via BUCCAL

## 2022-05-01 MED ORDER — ROCURONIUM BROMIDE 10 MG/ML (PF) SYRINGE
PREFILLED_SYRINGE | INTRAVENOUS | Status: AC
  Filled 2022-05-01: qty 10

## 2022-05-01 MED ORDER — PROPOFOL 1000 MG/100ML IV EMUL
INTRAVENOUS | Status: AC
  Filled 2022-05-01: qty 100

## 2022-05-01 MED ORDER — FENTANYL CITRATE PF 50 MCG/ML IJ SOSY
PREFILLED_SYRINGE | INTRAMUSCULAR | Status: AC
  Administered 2022-05-01: 25 ug via INTRAVENOUS
  Filled 2022-05-01: qty 1

## 2022-05-01 MED ORDER — PROPOFOL 10 MG/ML IV BOLUS
INTRAVENOUS | Status: AC
  Filled 2022-05-01: qty 20

## 2022-05-01 MED ORDER — ORAL CARE MOUTH RINSE: 15.0000 mL | Freq: Once | OROMUCOSAL | Status: AC

## 2022-05-01 MED ORDER — FENTANYL CITRATE (PF) 100 MCG/2ML IJ SOLN
INTRAMUSCULAR | Status: DC | PRN
  Administered 2022-05-01 (×5): 50 ug via INTRAVENOUS

## 2022-05-01 MED ORDER — ONDANSETRON 4 MG PO TBDP: 4.0000 mg | ORAL_TABLET | Freq: Four times a day (QID) | ORAL | Status: DC | PRN

## 2022-05-01 MED ORDER — PROPOFOL 10 MG/ML IV BOLUS
INTRAVENOUS | Status: DC | PRN
  Administered 2022-05-01: 150 mg via INTRAVENOUS

## 2022-05-01 MED ORDER — FENTANYL CITRATE PF 50 MCG/ML IJ SOSY
PREFILLED_SYRINGE | INTRAMUSCULAR | Status: AC
  Administered 2022-05-01: 50 ug via INTRAVENOUS
  Filled 2022-05-01: qty 2

## 2022-05-01 MED ORDER — PROPOFOL 500 MG/50ML IV EMUL
INTRAVENOUS | Status: DC | PRN
  Administered 2022-05-01: 150 ug/kg/min via INTRAVENOUS

## 2022-05-01 MED ORDER — SODIUM CHLORIDE 0.45 % IV SOLN: INTRAVENOUS | Status: DC

## 2022-05-01 MED ORDER — ONDANSETRON HCL 4 MG/2ML IJ SOLN
INTRAMUSCULAR | Status: DC | PRN
  Administered 2022-05-01: 4 mg via INTRAVENOUS

## 2022-05-01 MED ORDER — DEXAMETHASONE SODIUM PHOSPHATE 10 MG/ML IJ SOLN
INTRAMUSCULAR | Status: AC
  Filled 2022-05-01: qty 1

## 2022-05-01 MED ORDER — LACTATED RINGERS IV SOLN: INTRAVENOUS | Status: DC

## 2022-05-01 MED ORDER — MIDAZOLAM HCL 2 MG/2ML IJ SOLN
INTRAMUSCULAR | Status: AC
  Filled 2022-05-01: qty 2

## 2022-05-01 MED ORDER — HYDROCODONE-ACETAMINOPHEN 5-325 MG PO TABS: 1.0000 | ORAL_TABLET | Freq: Four times a day (QID) | ORAL | 0 refills | Status: DC | PRN

## 2022-05-01 MED ORDER — LIDOCAINE 2% (20 MG/ML) 5 ML SYRINGE
INTRAMUSCULAR | Status: DC | PRN
  Administered 2022-05-01: 100 mg via INTRAVENOUS

## 2022-05-01 MED ORDER — CEFAZOLIN SODIUM-DEXTROSE 2-4 GM/100ML-% IV SOLN
2.0000 g | INTRAVENOUS | Status: AC
  Administered 2022-05-01: 2 g via INTRAVENOUS
  Filled 2022-05-01: qty 100

## 2022-05-01 MED ORDER — AMISULPRIDE (ANTIEMETIC) 5 MG/2ML IV SOLN: 10.0000 mg | Freq: Once | INTRAVENOUS | Status: DC | PRN

## 2022-05-01 MED ORDER — HEMOSTATIC AGENTS (NO CHARGE) OPTIME
TOPICAL | Status: DC | PRN
  Administered 2022-05-01: 1 via TOPICAL

## 2022-05-01 MED ORDER — ACETAMINOPHEN 500 MG PO TABS
1000.0000 mg | ORAL_TABLET | Freq: Once | ORAL | Status: DC
  Filled 2022-05-01: qty 2

## 2022-05-01 MED ORDER — SUGAMMADEX SODIUM 200 MG/2ML IV SOLN
INTRAVENOUS | Status: DC | PRN
  Administered 2022-05-01: 200 mg via INTRAVENOUS

## 2022-05-01 MED ORDER — ACETAMINOPHEN 650 MG RE SUPP: 650.0000 mg | Freq: Four times a day (QID) | RECTAL | Status: DC | PRN

## 2022-05-01 MED ORDER — ACETAMINOPHEN 10 MG/ML IV SOLN
1000.0000 mg | Freq: Four times a day (QID) | INTRAVENOUS | Status: DC
  Administered 2022-05-01: 1000 mg via INTRAVENOUS
  Filled 2022-05-01: qty 100

## 2022-05-01 MED ORDER — ONDANSETRON HCL 4 MG/2ML IJ SOLN
4.0000 mg | Freq: Four times a day (QID) | INTRAMUSCULAR | Status: DC | PRN
  Administered 2022-05-01: 4 mg via INTRAVENOUS
  Filled 2022-05-01: qty 2

## 2022-05-01 MED ORDER — CHLORHEXIDINE GLUCONATE CLOTH 2 % EX PADS: 6.0000 | MEDICATED_PAD | Freq: Once | CUTANEOUS | Status: DC

## 2022-05-01 MED ORDER — MIDAZOLAM HCL 5 MG/5ML IJ SOLN
INTRAMUSCULAR | Status: DC | PRN
  Administered 2022-05-01: 2 mg via INTRAVENOUS

## 2022-05-01 MED ORDER — FENTANYL CITRATE PF 50 MCG/ML IJ SOSY
25.0000 ug | PREFILLED_SYRINGE | INTRAMUSCULAR | Status: DC | PRN
  Administered 2022-05-01: 50 ug via INTRAVENOUS

## 2022-05-01 MED ORDER — PROPOFOL 500 MG/50ML IV EMUL
INTRAVENOUS | Status: AC
  Filled 2022-05-01: qty 50

## 2022-05-01 MED ORDER — SCOPOLAMINE 1 MG/3DAYS TD PT72
1.0000 | MEDICATED_PATCH | Freq: Once | TRANSDERMAL | Status: DC
  Administered 2022-05-01: 1.5 mg via TRANSDERMAL
  Filled 2022-05-01: qty 1

## 2022-05-01 MED ORDER — ROCURONIUM BROMIDE 10 MG/ML (PF) SYRINGE
PREFILLED_SYRINGE | INTRAVENOUS | Status: DC | PRN
  Administered 2022-05-01: 50 mg via INTRAVENOUS

## 2022-05-01 MED ORDER — 0.9 % SODIUM CHLORIDE (POUR BTL) OPTIME
TOPICAL | Status: DC | PRN
  Administered 2022-05-01: 1000 mL

## 2022-05-01 MED ORDER — ONDANSETRON HCL 4 MG/2ML IJ SOLN
INTRAMUSCULAR | Status: AC
  Filled 2022-05-01: qty 2

## 2022-05-01 MED ORDER — CHLORHEXIDINE GLUCONATE 0.12 % MT SOLN
15.0000 mL | Freq: Once | OROMUCOSAL | Status: AC
  Administered 2022-05-01: 15 mL via OROMUCOSAL

## 2022-05-01 SURGICAL SUPPLY — 32 items
ADH SKN CLS APL DERMABOND .7 (GAUZE/BANDAGES/DRESSINGS) ×1
APL PRP STRL LF DISP 70% ISPRP (MISCELLANEOUS) ×1
ATTRACTOMAT 16X20 MAGNETIC DRP (DRAPES) ×1 IMPLANT
BAG COUNTER SPONGE SURGICOUNT (BAG) ×1 IMPLANT
BAG SPNG CNTER NS LX DISP (BAG) ×1
BLADE SURG 15 STRL LF DISP TIS (BLADE) ×1 IMPLANT
BLADE SURG 15 STRL SS (BLADE) ×1
CHLORAPREP W/TINT 26 (MISCELLANEOUS) ×1 IMPLANT
CLIP TI MEDIUM 6 (CLIP) ×2 IMPLANT
CLIP TI WIDE RED SMALL 6 (CLIP) ×2 IMPLANT
COVER SURGICAL LIGHT HANDLE (MISCELLANEOUS) ×1 IMPLANT
DERMABOND ADVANCED .7 DNX12 (GAUZE/BANDAGES/DRESSINGS) ×1 IMPLANT
DRAPE LAPAROTOMY T 98X78 PEDS (DRAPES) ×1 IMPLANT
DRAPE UTILITY XL STRL (DRAPES) ×1 IMPLANT
ELECT PENCIL ROCKER SW 15FT (MISCELLANEOUS) ×1 IMPLANT
ELECT REM PT RETURN 15FT ADLT (MISCELLANEOUS) ×1 IMPLANT
GAUZE 4X4 16PLY ~~LOC~~+RFID DBL (SPONGE) ×1 IMPLANT
GLOVE SURG ORTHO 8.0 STRL STRW (GLOVE) ×1 IMPLANT
GOWN STRL REUS W/ TWL XL LVL3 (GOWN DISPOSABLE) ×2 IMPLANT
GOWN STRL REUS W/TWL XL LVL3 (GOWN DISPOSABLE) ×2
HEMOSTAT SURGICEL 2X4 FIBR (HEMOSTASIS) ×1 IMPLANT
ILLUMINATOR WAVEGUIDE N/F (MISCELLANEOUS) ×1 IMPLANT
KIT BASIN OR (CUSTOM PROCEDURE TRAY) ×1 IMPLANT
KIT TURNOVER KIT A (KITS) IMPLANT
PACK BASIC VI WITH GOWN DISP (CUSTOM PROCEDURE TRAY) ×1 IMPLANT
SHEARS HARMONIC 9CM CVD (BLADE) ×1 IMPLANT
SUT MNCRL AB 4-0 PS2 18 (SUTURE) ×1 IMPLANT
SUT VIC AB 3-0 SH 18 (SUTURE) ×2 IMPLANT
SYR BULB IRRIG 60ML STRL (SYRINGE) ×1 IMPLANT
TOWEL OR 17X26 10 PK STRL BLUE (TOWEL DISPOSABLE) ×1 IMPLANT
TOWEL OR NON WOVEN STRL DISP B (DISPOSABLE) ×1 IMPLANT
TUBING CONNECTING 10 (TUBING) ×1 IMPLANT

## 2022-05-01 NOTE — Telephone Encounter (Signed)
Spoke with patient confirming upcoming appointment  

## 2022-05-01 NOTE — Anesthesia Postprocedure Evaluation (Signed)
Anesthesia Post Note  Patient: Gabriela Evans  Procedure(s) Performed: TOTAL THYROIDECTOMY, POSSIBLE LEFT THYROID LOBECTOMY (Neck)     Patient location during evaluation: PACU Anesthesia Type: General Level of consciousness: awake and alert Pain management: pain level controlled Vital Signs Assessment: post-procedure vital signs reviewed and stable Respiratory status: spontaneous breathing, nonlabored ventilation and respiratory function stable Cardiovascular status: blood pressure returned to baseline Postop Assessment: no apparent nausea or vomiting Anesthetic complications: no   No notable events documented.  Last Vitals:  Vitals:   05/01/22 1300 05/01/22 1317  BP: 117/76 138/83  Pulse: (!) 50 (!) 55  Resp: 14 16  Temp: 36.9 C 37.1 C  SpO2: 98% 100%    Last Pain:  Vitals:   05/01/22 1317  TempSrc: Oral  PainSc:                  Shanda Howells

## 2022-05-01 NOTE — Transfer of Care (Signed)
Immediate Anesthesia Transfer of Care Note  Patient: Gabriela Evans  Procedure(s) Performed: TOTAL THYROIDECTOMY, POSSIBLE LEFT THYROID LOBECTOMY (Neck)  Patient Location: PACU  Anesthesia Type:General  Level of Consciousness: drowsy and patient cooperative  Airway & Oxygen Therapy: Patient Spontanous Breathing and Patient connected to face mask oxygen  Post-op Assessment: Report given to RN and Post -op Vital signs reviewed and stable  Post vital signs: Reviewed and stable  Last Vitals:  Vitals Value Taken Time  BP 137/87 05/01/22 1154  Temp 36.6 C 05/01/22 1154  Pulse 56 05/01/22 1159  Resp 15 05/01/22 1159  SpO2 100 % 05/01/22 1159  Vitals shown include unvalidated device data.  Last Pain:  Vitals:   05/01/22 0945  TempSrc: Oral  PainSc:          Complications: No notable events documented.

## 2022-05-01 NOTE — Discharge Instructions (Signed)
CENTRAL  SURGERY - Dr. Quintarius Ferns  THYROID & PARATHYROID SURGERY:  POST-OP INSTRUCTIONS  Always review the instruction sheet provided by the hospital nurse at discharge.  A prescription for pain medication may be sent to your pharmacy at the time of discharge.  Take your pain medication as prescribed.  If narcotic pain medicine is not needed, then you may take acetaminophen (Tylenol) or ibuprofen (Advil) as needed for pain or soreness.  Take your normal home medications as prescribed unless otherwise directed.  If you need a refill on your pain medication, please contact the office during regular business hours.  Prescriptions will not be processed by the office after 5:00PM or on weekends.  Start with a light diet upon arrival home, such as soup and crackers or toast.  Be sure to drink plenty of fluids.  Resume your normal diet the day after surgery.  Most patients will experience some swelling and bruising on the chest and neck area.  Ice packs will help for the first 48 hours after arriving home.  Swelling and bruising will take several days to resolve.   It is common to experience some constipation after surgery.  Increasing fluid intake and taking a stool softener (Colace) will usually help to prevent this problem.  A mild laxative (Milk of Magnesia or Miralax) should be taken according to package directions if there has been no bowel movement after 48 hours.  Dermabond glue covers your incision. This seals the wound and you may shower at any time. The Dermabond will remain in place for about a week.  You may gradually remove the glue when it loosens around the edges.  If you need to loosen the Dermabond for removal, apply a layer of Vaseline to the wound for 15 minutes and then remove with a Kleenex. Your sutures are under the skin and will not show - they will dissolve on their own.  You may resume light daily activities beginning the day after discharge (such as self-care,  walking, climbing stairs), gradually increasing activities as tolerated. You may have sexual intercourse when it is comfortable. Refrain from any heavy lifting or straining until approved by your doctor. You may drive when you no longer are taking prescription pain medication, you can comfortably wear a seatbelt, and you can safely maneuver your car and apply the brakes.  You will see your doctor in the office for a follow-up appointment approximately three weeks after your surgery.  Make sure that you call for this appointment within a day or two after you arrive home to insure a convenient appointment time. Please have any requested laboratory tests performed a few days prior to your office visit so that the results will be available at your follow up appointment.  WHEN TO CALL THE [facility] OFFICE: -- Fever greater than 101.5 -- Inability to urinate -- Nausea and/or vomiting - persistent -- Extreme swelling or bruising -- Continued bleeding from incision -- Increased pain, redness, or drainage from the incision -- Difficulty swallowing or breathing -- Muscle cramping or spasms -- Numbness or tingling in hands or around lips  The clinic staff is available to answer your questions during regular business hours.  Please don't hesitate to call and ask to speak to one of the nurses if you have concerns.  [facility] OFFICE: [phone] (24 hours)  Please sign up for MyChart accounts. This will allow you to communicate directly with my nurse or myself without having to call the office. It will also allow you   to view your test results. You will need to enroll in MyChart for my office ([facility]) and for the hospital ([facility]).  Trivia Heffelfinger, MD [facility] A [facility] practice 

## 2022-05-01 NOTE — Interval H&P Note (Signed)
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  The various methods of treatment have been discussed with the patient and family. After consideration of risks, benefits and other options for treatment, the patient has consented to    Procedure(s): TOTAL THYROIDECTOMY, POSSIBLE LEFT THYROID LOBECTOMY (N/A) as a surgical intervention.    The patient's history has been reviewed, patient examined, no change in status, stable for surgery.  I have reviewed the patient's chart and labs.  Questions were answered to the patient's satisfaction.    Darnell Level, MD [facility] A [facility] practice Office: [phone]   Darnell Level

## 2022-05-01 NOTE — TOC CM/SW Note (Signed)
Transition of Care [facility]) Screening Note  Patient Details  Name: KENSLI KOREY Date of Birth: 1980-08-25  Transition of Care [facility]) CM/SW Contact:    Ewing Schlein, LCSW Phone Number: 05/01/2022, 2:00 PM  Transition of Care Department [facility]) has reviewed patient and no TOC needs have been identified at this time. We will continue to monitor patient advancement through interdisciplinary progression rounds. If new patient transition needs arise, please place a TOC consult.

## 2022-05-01 NOTE — Op Note (Signed)
Procedure Note  Pre-operative Diagnosis:  thyroid neoplasm of uncertain behavior  Post-operative Diagnosis:  same  Surgeon:  Darnell Level, MD  Assistant:  none   Procedure:  Left thyroid lobectomy and isthmusectomy  Anesthesia:  General  Estimated Blood Loss:  15 cc  Drains: none         Specimen: thyroid lobe to pathology  Indications:  Patient is referred by Lillard Anes, NP, for surgical evaluation and management of a newly diagnosed left thyroid nodule with cytologic atypia worrisome for papillary thyroid carcinoma. Patient has a history of breast cancer. She began to notice dysphagia in December 2023. Most of this was pill dysphagia and some discomfort in the left neck. Patient underwent an ultrasound on April 01, 2022. This shows a normal-sized thyroid gland with a solitary nodule in the left inferior pole measuring 1.6 cm. It was felt to be moderately suspicious and fine-needle aspiration biopsy was recommended. Patient underwent biopsy on April 08, 2022. This demonstrated findings suspicious for malignancy, Bethesda category V, suspicious for papillary thyroid carcinoma. Patient has no prior history of thyroid disease. She has never been on thyroid medication. Her most recent TSH level is normal at 2.372. She has had no prior head or neck surgery. There is a family history of thyroid disease in multiple family members including thyroid carcinoma. Patient presents today accompanied by her husband for evaluation. 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   Procedure Details: Procedure was done in OR #1 at the [facility]. The patient was brought to the operating room and placed in a supine position on the operating room table. Following administration of general anesthesia, the patient was positioned and then prepped and draped in the usual aseptic fashion. After ascertaining that an adequate level of anesthesia had been  achieved, a small Kocher incision was made with #15 blade. Dissection was carried through subcutaneous tissues and platysma. Hemostasis was achieved with the electrocautery. Skin flaps were elevated cephalad and caudad from the thyroid notch to the sternal notch. A self-retaining retractor was placed for exposure. Strap muscles were incised in the midline and dissection was begun on the left side. Strap muscles were reflected laterally. The left thyroid lobe was normal in size with a solitary nodule in the inferior pole. The lobe was gently mobilized with blunt dissection. Superior pole vessels were dissected out and divided individually between small and medium ligaclips with the harmonic scalpel. The thyroid lobe was rolled anteriorly. Branches of the inferior thyroid artery were divided between small ligaclips with the harmonic scalpel. Inferior venous tributaries were divided between ligaclips. Both the superior and inferior parathyroid glands were identified and preserved on their vascular pedicles. The recurrent laryngeal nerve was identified and preserved along its course. The ligament of Allyson Sabal was released with the electrocautery and the gland was mobilized onto the anterior trachea. Isthmus was mobilized across the midline. There was no significant pyramidal lobe present. The thyroid parenchyma was transected at the junction of the isthmus and contralateral thyroid lobe with the harmonic scalpel. A suture was used to mark the isthmus margin. The thyroid lobe and isthmus were submitted to pathology for review.  The entire field was palpated for evidence of lymphadenopathy or extra-thyroidal disease.  No worrisome findings were noted.  No enlarged lymph nodes were identified.  The neck was irrigated with warm saline. Fibrillar was placed throughout the operative field. Strap muscles were approximated in the midline with interrupted 3-0 Vicryl sutures.  Platysma was closed with interrupted 3-0 Vicryl  sutures. Skin was closed with a running 4-0 Monocryl subcuticular suture.  Wound was washed and dried and Dermabond was applied. The patient was awakened from anesthesia and brought to the recovery room. The patient tolerated the procedure well.   Darnell Level, MD [facility] Surgery Office: [phone]

## 2022-05-01 NOTE — Anesthesia Procedure Notes (Signed)
Procedure Name: Intubation Date/Time: 05/01/2022 10:24 AM  Performed by: Wynonia Sours, CRNAPre-anesthesia Checklist: Patient identified, Emergency Drugs available, Suction available, Patient being monitored and Timeout performed Patient Re-evaluated:Patient Re-evaluated prior to induction Oxygen Delivery Method: Circle system utilized Preoxygenation: Pre-oxygenation with 100% oxygen Induction Type: IV induction Ventilation: Mask ventilation without difficulty Laryngoscope Size: Mac and 4 Grade View: Grade I Tube type: Oral Tube size: 7.0 mm Number of attempts: 1 Airway Equipment and Method: Stylet Placement Confirmation: ETT inserted through vocal cords under direct vision, positive ETCO2, CO2 detector and breath sounds checked- equal and bilateral Secured at: 20 cm Tube secured with: Tape Dental Injury: Teeth and Oropharynx as per pre-operative assessment

## 2022-05-02 ENCOUNTER — Encounter (HOSPITAL_COMMUNITY): Payer: Self-pay | Admitting: Surgery

## 2022-05-02 ENCOUNTER — Other Ambulatory Visit (HOSPITAL_COMMUNITY): Payer: Self-pay

## 2022-05-02 DIAGNOSIS — D440 Neoplasm of uncertain behavior of thyroid gland: Secondary | ICD-10-CM | POA: Diagnosis not present

## 2022-05-02 MED ORDER — OXYCODONE HCL 5 MG/5ML PO SOLN
5.0000 mg | Freq: Four times a day (QID) | ORAL | 0 refills | Status: AC | PRN
  Filled 2022-05-02: qty 60, 3d supply, fill #0

## 2022-05-02 NOTE — Progress Notes (Signed)
Assessment unchanged. Pt and husband verbalized understanding of dc instructions including medications, follow up care and when to notify MD. Discharged via wc to front entrance accompanied by NT and husband.

## 2022-05-02 NOTE — Plan of Care (Signed)

## 2022-05-02 NOTE — Discharge Summary (Signed)
Physician Discharge Summary  Gabriela Evans ZOX:[id number] DOB: 08-Jan-1981 DOA: 05/01/2022  PCP: Serena Croissant, MD  Admit date: 05/01/2022 Discharge date: 05/02/22  Recommendations for Outpatient Follow-up:     Follow-up Information     Darnell Level, MD. Schedule an appointment as soon as possible for a visit in 3 week(s).   Specialty: General Surgery Why: For wound re-check Contact information: [address] [place] [address] [phone]                Discharge Diagnoses:  Neoplasm of uncertain behavior of thyroid gland Dysphagia, unspecified type   Surgical Procedure:  Left thyroid lobectomy and isthmusectomy Dr Gerrit Friends  Discharge Condition: good Disposition: home  Diet recommendation: soft diet  Filed Weights   05/01/22 0818 05/01/22 0847 05/01/22 0945  Weight: 97.6 kg 97 kg 97 kg    History of present illness:   Patient is referred by Lillard Anes, NP, for surgical evaluation and management of a newly diagnosed left thyroid nodule with cytologic atypia worrisome for papillary thyroid carcinoma. Patient has a history of breast cancer. She began to notice dysphagia in December 2023. Most of this was pill dysphagia and some discomfort in the left neck. Patient underwent an ultrasound on April 01, 2022. This shows a normal-sized thyroid gland with a solitary nodule in the left inferior pole measuring 1.6 cm. It was felt to be moderately suspicious and fine-needle aspiration biopsy was recommended. Patient underwent biopsy on April 08, 2022. This demonstrated findings suspicious for malignancy, Bethesda category V, suspicious for papillary thyroid carcinoma. Patient has no prior history of thyroid disease. She has never been on thyroid medication. Her most recent TSH level is normal at 2.372. She has had no prior head or neck surgery. There is a family history of thyroid disease in multiple family members including thyroid carcinoma. Patient presents  today accompanied by her husband for evaluation. 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   Hospital Course:  Patient underwent left thyroid lobectomy and isthmusectomy by Dr. Gerrit Friends on May 01, 2022.  She was kept overnight for observation.  On postop day 1 she is doing well.  She had still some soreness in her neck and a little bit of occultly swallowing solid pills so we converted her pain medicine to liquid formulation.  She was tolerating liquids.  Her voice was weak but not raspy or hoarse.  There is no signs of neck swelling or hematoma.  No perioral numbness or tingling.  She is ambulating without difficulty.  She was deemed stable for discharge.  I discussed discharge instructions with the patient.  BP 105/65 (BP Location: Left Arm)   Pulse (!) 54   Temp 98.6 F (37 C) (Oral)   Resp 18   Ht 5\' 9"  (1.753 m)   Wt 97 kg   LMP 04/18/2019   SpO2 98%   BMI 31.58 kg/m   Gen: alert, NAD, non-toxic appearing Pupils: equal, no scleral icterus Pulm: ymmetric chest rise Neck : incision ok, no swelling/hematoma  Ext: no edema, no calf tenderness Skin: no rash, no jaundice  Discharge Instructions  Discharge Instructions     Call MD for:   Complete by: As directed    Temperature >101   Call MD for:  hives   Complete by: As directed    Call MD for:  persistant dizziness or light-headedness   Complete by: As directed    Call MD for:  persistant nausea and vomiting   Complete by: As directed    Call MD for:  redness, tenderness, or signs of infection (pain, swelling, redness, odor or green/yellow discharge around incision site)   Complete by: As directed    Call MD for:  severe uncontrolled pain   Complete by: As directed    Diet general   Complete by: As directed    Discharge instructions   Complete by: As directed    See [facility] discharge instructions   Increase activity slowly   Complete by: As directed       Allergies  as of 05/02/2022       Reactions   Nickel Swelling   Gluten Meal Nausea Only   Milk-related Compounds Nausea And Vomiting   Dairy products   Other    Sensitive to gluten and dairy   Tramadol Nausea Only, Swelling   Oxycodone-acetaminophen Itching   Tamoxifen Itching, Rash        Medication List     TAKE these medications    activated charcoal Powd Take 1 packet by mouth as needed (gi upset).   hydrocortisone cream 1 % Apply 1 Application topically as needed for itching.   ibuprofen 200 MG tablet Commonly known as: ADVIL Take 200 mg by mouth as needed for moderate pain. What changed: Another medication with the same name was removed. Continue taking this medication, and follow the directions you see here.   loratadine 10 MG tablet Commonly known as: CLARITIN Take 10 mg by mouth daily.   multivitamin with minerals tablet Take 3 tablets by mouth daily.   oxyCODONE 5 MG/5ML solution Commonly known as: ROXICODONE Take 5 mLs (5 mg total) by mouth every 6 (six) hours as needed for up to 5 days for severe pain.   PROBIOTIC PO Take 1 tablet by mouth daily.        Follow-up Information     Darnell Level, MD. Schedule an appointment as soon as possible for a visit in 3 week(s).   Specialty: General Surgery Why: For wound re-check Contact information: [address] [place] [address] [phone]                  The results of significant diagnostics from this hospitalization (including imaging, microbiology, ancillary and laboratory) are listed below for reference.    Significant Diagnostic Studies: DG Chest 2 View per protocol  Result Date: 04/29/2022 CLINICAL DATA:  Thyroid neoplasm.  Preoperative chest filled EXAM: CHEST - 2 VIEW COMPARISON:  Chest radiograph 10/16/2019 FINDINGS: The heart size and mediastinal contours are within normal limits. Both lungs are clear. The visualized skeletal structures are unremarkable. IMPRESSION: No  active cardiopulmonary disease. Electronically Signed   By: Annia Belt M.D.   On: 04/29/2022 22:02   DG Micah Flesher SPEECH PATH  Result Date: 04/23/2022 Table formatting from the original result was not included. Modified Barium Swallow Study Patient Details Name: KENEDI CILIA MRN: [id number] Date of Birth: Dec 02, 1980 Today's Date: 04/23/2022 HPI/PMH: HPI: Pt is a 42 yo female w/ PMH including Malignant neoplasm of upper-outer quadrant of left breast in 2022 w/ XRT 01/14/2021 through 03/03/2021, and recent discussion w her NP re: thyroid biopsy(03/2022).  Pt denies any baseline of Reflux and is not on a PPI.  She consumes a Regular diet at home w/ c/o feelings of something "caught" in her throat at times; increased throat clearing -- she endorses this feeling moreso w/ solids/meals and described a recent occurence  as she completed a meal of solid foods.  No h/o of neurological dxs/deficits, no recent weight loss.  [name]; follows all instructions. Clinical Impression: Clinical Impression: Pt presents for MBSS today per NP, Oncology office. Pt reports incisostent Globus feelings and throat clearing, especially post meals. She denies any dx'd Reflux/GERD and has not had any f/u w/ GI. She is not on a PPI.  Patient presents with functional oropharyngeal swallowing w/ No oropharyngeal phase dysphagia noted during this exam. No laryngeal penetration nor aspiration occurred during this study.  Oral stage is characterized by adequate lip closure, bolus preparation and containment, and anterior to posterior transit. Swallow initiation occurs at the level of the valleculae primarly(thins).   Pharyngeal stage is noted for functional tongue base retraction, hyolaryngeal excursion, and adequate pharyngeal constriction. Epiglottic deflection is complete; there is No laryngeal penetration nor aspiration. No pharyngeal residue remaining post swallows. Pharyngeal stripping wave is complete.   Amplitude/duration of cricopharyngeus  opening is WFL. There is adequate/complete clearance through the cervical esophagus. Of Note: apparent "bumpy" presentation along the posterior cervical esophageal wall; this did not impede bolus flow. HOWEVER, signficant bolus Stasis was noted from the level of the Clavicles > Distally during a trial of solid during an A-P sweep of the Esophagus. It was also noted that there was Retention of bolus material throughout the Esophagus upon initial A-P viewing, from previous trials given prior during the exam -- See Imaging/pictures. Strongly suspect that pt's complaints may be directly related to the Esophageal phase Dysmotility seen during exam today. Barium tablet was not tested -- pt stated she was using chewable vitamins at home d/t disfficulty swallowing pills, "except very small ones". Education was given.  Pt reported a Globus sensation; throat clearing observed x2-3 during exam/post po trials. Consistencies tested were thin liquids x2 tsps, 1 cup sip, 3 sequential sips, nectar x1 tsp, 1 cup sip, 2 sequential cup sips, honey x1 tsp, pudding x1 tsp, regular solid (1/2 graham cracker with pudding) x2.  Recommend patient continue a fairly regular diet with meats/foods cut small and foods moistened well; thin liquids. Avoid problematic foods; cold liquids if bothersome. Educated pt verbally re: impact of potential Reflux activity on swallowing, overt s/s of Reflux including globus sensation and throat clearing with/post meals.  Encouraged smaller more frequent meals, alternate foods/liquids, no carbonated drinks during meals, and do not overeat. Recommend consultation w/ GI for formal assessment, management, and further Education on Esophageal phase Dysmotility. Factors that may increase risk of adverse event in presence of aspiration Rubye Oaks & Clearance Coots 2021): Factors that may increase risk of adverse event in presence of aspiration Rubye Oaks & Clearance Coots 2021): -- (n/a) Recommendations/Plan: Swallowing Evaluation  Recommendations Swallowing Evaluation Recommendations Recommendations: PO diet PO Diet Recommendation: Regular; Thin liquids (Level 0) (well-cut meats/foods; moistened foods; no carbonated drinks w/ meals) Liquid Administration via: Cup (less straw use d/t air swallowed) Medication Administration: Whole meds with puree (or in chewable/liquid/powder forms) Supervision: Patient able to self-feed Swallowing strategies  : Slow rate; Small bites/sips; Minimize environmental distractions; Follow solids with liquids Postural changes: Position pt fully upright for meals; Stay upright 30-60 min after meals (REFLUX precautions) Oral care recommendations: Oral care BID (2x/day); Pt independent with oral care Recommended consults: Consider GI consultation; Consider esophageal assessment No skilled ST services indicated. Treatment Plan Treatment Plan Treatment recommendations: No treatment recommended at this time Follow-up recommendations: No SLP follow up Recommendations Comment: f/u w/ GI for formal assessment, management, and education on Esophageal phase Dysmotility; may  consider Manometry Functional status assessment: Patient has had a recent decline in their functional status and demonstrates the ability to make significant improvements in function in a reasonable and predictable amount of time. Treatment frequency: -- (n/a) Treatment duration: -- (n/a) Interventions: Aspiration precaution training; Patient/family education (REFLUX precautions) Recommendations Recommendations for follow up therapy are one component of a multi-disciplinary discharge planning process, led by the attending physician.  Recommendations may be updated based on patient status, additional functional criteria and insurance authorization. Assessment: Orofacial Exam: Orofacial Exam Oral Cavity: Oral Hygiene: WFL Oral Cavity - Dentition: Adequate natural dentition Orofacial Anatomy: WFL Oral Motor/Sensory Function: WFL Anatomy: Anatomy: WFL Boluses  Administered: Boluses Administered Boluses Administered: Thin liquids (Level 0); Mildly thick liquids (Level 2, nectar thick); Moderately thick liquids (Level 3, honey thick); Puree; Solid  Oral Impairment Domain: Oral Impairment Domain Lip Closure: No labial escape Tongue control during bolus hold: Cohesive bolus between tongue to palatal seal Bolus preparation/mastication: Timely and efficient chewing and mashing Bolus transport/lingual motion: Brisk tongue motion Oral residue: Complete oral clearance Location of oral residue : N/A Initiation of pharyngeal swallow : Valleculae (w/ thins; slight spillage from intermittently)  Pharyngeal Impairment Domain: Pharyngeal Impairment Domain Soft palate elevation: No bolus between soft palate (SP)/pharyngeal wall (PW) Laryngeal elevation: Complete superior movement of thyroid cartilage with complete approximation of arytenoids to epiglottic petiole Anterior hyoid excursion: Complete anterior movement Epiglottic movement: Complete inversion Laryngeal vestibule closure: Complete, no air/contrast in laryngeal vestibule Pharyngeal stripping wave : Present - complete Pharyngeal contraction (A/P view only): Complete Pharyngoesophageal segment opening: Complete distension and complete duration, no obstruction of flow Tongue base retraction: No contrast between tongue base and posterior pharyngeal wall (PPW) Pharyngeal residue: Complete pharyngeal clearance Location of pharyngeal residue: N/A  Esophageal Impairment Domain: Esophageal Impairment Domain Esophageal clearance upright position: Esophageal retention (noted diffusely throughout the Esophagus in AP view) Pill: Esophageal Impairment Domain Esophageal clearance upright position: Esophageal retention (noted diffusely throughout the Esophagus in AP view) Penetration/Aspiration Scale Score: Penetration/Aspiration Scale Score 1.  Material does not enter airway: Thin liquids (Level 0); Mildly thick liquids (Level 2, nectar  thick); Moderately thick liquids (Level 3, honey thick); Puree; Solid Compensatory Strategies: Compensatory Strategies Compensatory strategies: -- (n/a)   General Information: Caregiver present: No  Diet Prior to this Study: Regular; Thin liquids (Level 0)   No data recorded  Respiratory Status: WFL   Supplemental O2: None (Room air)   History of Recent Intubation: No  Behavior/Cognition: Alert; Cooperative; Pleasant mood (x4) Self-Feeding Abilities: Able to self-feed Baseline vocal quality/speech: Normal Volitional Cough: Able to elicit Volitional Swallow: Able to elicit Exam Limitations: No limitations Goal Planning: Prognosis for improved oropharyngeal function: Fair (-Good) Barriers to Reach Goals: Time post onset; Severity of deficits No data recorded No data recorded Consulted and agree with results and recommendations: Patient Pain: Pain Assessment Pain Assessment: No/denies pain End of Session: Start Time:SLP Start Time (ACUTE ONLY): 1300 Stop Time: SLP Stop Time (ACUTE ONLY): 1400 Time Calculation:SLP Time Calculation (min) (ACUTE ONLY): 60 min Charges: SLP Evaluations $ SLP Speech Visit: 1 Visit SLP Evaluations $MBS Swallow: 1 Procedure SLP visit diagnosis: SLP Visit Diagnosis: Dysphagia, unspecified (R13.10) (Esophageal phase Dysmotility) Past Medical History: Past Medical History: Diagnosis Date  Anemia   Breast cancer (HCC)   Complication of anesthesia   nausea  Gestational diabetes   Kidney infection   Other abnormal glucose   Gestational diabetes with both children  Personal history of radiation therapy   Pneumonia   01-2019  PONV (postoperative nausea and vomiting)  Past Surgical History: Past Surgical History: Procedure Laterality Date  APPENDECTOMY    BREAST LUMPECTOMY    BREAST LUMPECTOMY WITH RADIOACTIVE SEED AND SENTINEL LYMPH NODE BIOPSY Left 11/29/2020  Procedure: LEFT BREAST LUMPECTOMY WITH RADIOACTIVE SEED AND SENTINEL LYMPH NODE BIOPSY;  Surgeon: Griselda Miner, MD;  Location: [facility];  Service: General;  Laterality: Left;  CESAREAN SECTION    X2  IUD inserted in OR    LAPAROSCOPIC APPENDECTOMY N/A 07/08/2021  Procedure: APPENDECTOMY LAPAROSCOPIC;  Surgeon: Leafy Ro, MD;  Location: [facility];  Service: General;  Laterality: N/A;  PELVIC LAPAROSCOPY  2009  remove perforated IUD  ROBOTIC ASSISTED LAPAROSCOPIC LYSIS OF ADHESION N/A 04/26/2019  Procedure: XI ROBOTIC ASSISTED LAPAROSCOPIC LYSIS OF ADHESION;  Surgeon: Genia Del, MD;  Location: [facility];  Service: Gynecology;  Laterality: N/A;  ROBOTIC ASSISTED TOTAL HYSTERECTOMY WITH BILATERAL SALPINGO OOPHERECTOMY Bilateral 04/26/2019  Procedure: XI ROBOTIC ASSISTED TOTAL LAPAROSCOPIC HYSTERECTOMY WITH BILATERAL SALPINGECTOMY;  Surgeon: Genia Del, MD;  Location: [facility];  Service: Gynecology;  Laterality: Bilateral;  requests 8:30 OR start in [place] Gyn block time requests 2 hours  WISDOM TOOTH EXTRACTION   Jerilynn Som, MS, CCC-SLP Speech Language Pathologist Rehab Services; [facility] [phone] (ascom) Watson,Katherine 04/23/2022, 6:08 PM  [place] FNA BX THYROID 1ST LESION AFIRMA  Result Date: 04/08/2022 INDICATION: Indeterminate thyroid nodule EXAM: ULTRASOUND GUIDED FINE NEEDLE ASPIRATION OF INDETERMINATE THYROID NODULE COMPARISON:  Ultrasound thyroid 04/01/2022 MEDICATIONS: 7 cc 1% lidocaine COMPLICATIONS: None immediate. TECHNIQUE: Informed written consent was obtained from the patient after a discussion of the risks, benefits and alternatives to treatment. Questions regarding the procedure were encouraged and answered. A timeout was performed prior to the initiation of the procedure. Pre-procedural ultrasound scanning demonstrated unchanged size and appearance of the indeterminate nodule within the left inferior thyroid The procedure was planned. The neck was prepped in the usual sterile fashion, and a sterile drape was applied covering the operative  field. A timeout was performed prior to the initiation of the procedure. Local anesthesia was provided with 1% lidocaine. Under direct ultrasound guidance, 5 FNA biopsies were performed of the nodule with a 25 gauge needle. Multiple ultrasound images were saved for procedural documentation purposes. The samples were prepared and submitted to pathology. Limited post procedural scanning was negative for hematoma or additional complication. Dressings were placed. The patient tolerated the above procedures procedure well without immediate postprocedural complication. FINDINGS: Nodule reference number based on prior diagnostic ultrasound: 1 Maximum size: 1.6 cm Location: Left; Inferior ACR TI-RADS risk category: TR4 (4-6 points) Reason for biopsy: meets ACR TI-RADS criteria Ultrasound imaging confirms appropriate placement of the needles within the thyroid nodule. IMPRESSION: Technically successful ultrasound guided fine needle aspiration of left inferior thyroid nodule. Read by: Mina Marble, PA-C Electronically Signed   By: Malachy Moan M.D.   On: 04/08/2022 17:11    Microbiology: No results found for this or any previous visit (from the past 240 hour(s)).   Labs: Basic Metabolic Panel: No results for input(s): "NA", "K", "CL", "CO2", "GLUCOSE", "BUN", "CREATININE", "CALCIUM", "MG", "PHOS" in the last 168 hours. Liver Function Tests: No results for input(s): "AST", "ALT", "ALKPHOS", "BILITOT", "PROT", "ALBUMIN" in the last 168 hours. No results for input(s): "LIPASE", "AMYLASE" in the last 168 hours. No results for input(s): "AMMONIA" in the last 168 hours. CBC: Recent Labs  Lab 04/29/22 1344  WBC 10.0  HGB 13.0  HCT 39.6  MCV 90.2  PLT 258   Cardiac Enzymes: No results for input(s): "CKTOTAL", "CKMB", "CKMBINDEX", "TROPONINI" in the last 168 hours. BNP: BNP (last 3 results) No results for input(s): "BNP" in the last 8760 hours.  ProBNP (last 3 results) No results for input(s):  "PROBNP" in the last 8760 hours.  CBG: No results for input(s): "GLUCAP" in the last 168 hours.  Principal Problem:   Neoplasm of uncertain behavior of thyroid gland Active Problems:   Dysphagia   Time coordinating discharge: 15 min  Signed:  Atilano Ina, MD [facility] [phone]  05/02/2022, 8:24 AM

## 2022-05-04 LAB — SURGICAL PATHOLOGY

## 2022-05-05 NOTE — Progress Notes (Signed)
Great news!  Final path shows an [name] - these behave in a benign fashion and simply need to be completely excised.  Your pathology shows that it is completely removed and should require no further treatment.  Darnell Level, MD [facility] Surgery A [facility] practice Office: [phone]

## 2022-05-23 NOTE — Progress Notes (Signed)
HEMATOLOGY-ONCOLOGY TELEPHONE VISIT PROGRESS NOTE  I connected with our patient on 05/27/22 at  9:15 AM EDT by telephone and verified that I am speaking with the correct person using two identifiers.  I discussed the limitations, risks, security and privacy concerns of performing an evaluation and management service by telephone and the availability of in person appointments.  I also discussed with the patient that there may be a patient responsible charge related to this service. The patient expressed understanding and agreed to proceed.   History of Present Illness: Gabriela Evans is a 42 year old above-mentioned history of left breast. She presents to the clinic for a telephone follow-up.  Since her thyroid surgery she has been having hoarseness of voice.  Oncology History  Malignant neoplasm of upper-outer quadrant of left breast in female, estrogen receptor positive (HCC)  10/28/2020 Initial Diagnosis   Presented with new focal thickening and pain her outer left breast. Diagnostic mammogram and [place]: indeterminate mass in the left breast at 3:00. 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  Breast MRI: 1 prominent lymph node and 9 mm mass     11/06/2020 Genetic Testing   Negative hereditary cancer genetic testing: no pathogenic variants detected in [facility] BRCAPlus Panel and [facility] CancerNext-Expanded +RNAinsight Panel. The report dates are November 06, 2020 and November 10, 2020, respectively.   The BRCAplus panel offered by [employer] and includes sequencing and deletion/duplication analysis for the following 8 genes: ATM, BRCA1, BRCA2, CDH1, CHEK2, PALB2, PTEN, and TP53.  The CancerNext-Expanded gene panel offered by [facility] and includes sequencing, rearrangement, and RNA analysis for the following 77 genes: AIP, ALK, APC, ATM, AXIN2, BAP1, BARD1, BLM, BMPR1A, BRCA1, BRCA2, BRIP1, CDC73, CDH1, CDK4, CDKN1B, CDKN2A, CHEK2,  CTNNA1, DICER1, FANCC, FH, FLCN, GALNT12, KIF1B, LZTR1, MAX, MEN1, MET, MLH1, MSH2, MSH3, MSH6, MUTYH, NBN, NF1, NF2, NTHL1, PALB2, PHOX2B, PMS2, POT1, PRKAR1A, PTCH1, PTEN, RAD51C, RAD51D, RB1, RECQL, RET, SDHA, SDHAF2, SDHB, SDHC, SDHD, SMAD4, SMARCA4, SMARCB1, SMARCE1, STK11, SUFU, TMEM127, TP53, TSC1, TSC2, VHL and XRCC2 (sequencing and deletion/duplication); EGFR, EGLN1, HOXB13, KIT, MITF, PDGFRA, POLD1, and POLE (sequencing only); EPCAM and GREM1 (deletion/duplication only).    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, margins negative, ER 95%, PR 100%, HER2 negative, Ki-67 10%    12/11/2020 Oncotype testing   Oncotype score: 20, distant recurrence at 9 years: 6%   01/14/2021 - 03/03/2021 Radiation Therapy     Site Technique Total Dose (Gy) Dose per Fx (Gy) Completed Fx Beam Energies  Breast, Left: Breast_L 3D 50.4/50.4 1.8 28/28 6X  Breast, Left: Breast_L_Bst 3D 10/10 2 5/5 6X     03/2021 - 04/2021 Anti-estrogen oral therapy   Tamoxifen daily--unable to tolerate     REVIEW OF SYSTEMS:   Constitutional: Denies fevers, chills or abnormal weight loss All other systems were reviewed with the patient and are negative. 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 Oncotype DX: Low risk no role of chemo 2. Adjuvant radiation therapy completed 03/03/2021 3. Adjuvant antiestrogen therapy started 03/19/2021 discontinued 05/03/2021 (allergic reaction to Tamoxifen) -------------------------------------------------------------------------------------------------------------- Breast cancer  surveillance: Breast MRI 09/04/2021:  Benign breast density category D Mammogram 10/21/2021: Marked skin thickening left breast with lymphedema benign breast density category C Breast exam 12/04/2021: Benign   Multiple psychomotor symptoms especially brief lapses in focus and concentration/memory, etc.: Much improved after stopping antiestrogen therapy Severe hot flashes: Much improved after stopping antiestrogen therapy Seeing Gastroenterology for esophagitis Seeing Endocrinology    Continue with annual mammogram and breast MRI surveillance  Papillary thyroid carcinoma: 05/01/2022 left thyroid lobectomy: 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  I discussed the assessment and treatment plan with the patient. The patient was provided an opportunity to ask questions and all were answered. The patient agreed with the plan and demonstrated an understanding of the instructions. The patient was advised to call back or seek an in-person evaluation if the symptoms worsen or if the condition fails to improve as anticipated.   I provided 12 minutes of non-face-to-face time during this encounter.  This includes time for charting and coordination of care   Tamsen Meek, MD  I Janan Ridge am acting as a scribe for Dr.Davisha Linthicum  I have reviewed the above documentation for accuracy and completeness, and I agree with the above.

## 2022-05-25 ENCOUNTER — Ambulatory Visit: Admission: RE | Admit: 2022-05-25 | Source: Ambulatory Visit

## 2022-05-26 ENCOUNTER — Telehealth: Payer: Self-pay | Admitting: *Deleted

## 2022-05-26 NOTE — Telephone Encounter (Signed)
Received call from pt stating she canceled her CT CAP due to being overwhelmed with MD visits.  Pt states she is unsure if CT is needed at this time considering thyroid biopsy results were better than expected.  Pt states she would still prefer to keep her MD visit to talk about obtaining CT in the future.

## 2022-05-27 ENCOUNTER — Inpatient Hospital Stay: Attending: Hematology and Oncology | Admitting: Hematology and Oncology

## 2022-05-27 DIAGNOSIS — Z170 Estrogen receptor positive status [ER+]: Secondary | ICD-10-CM | POA: Diagnosis not present

## 2022-05-27 DIAGNOSIS — C50412 Malignant neoplasm of upper-outer quadrant of left female breast: Secondary | ICD-10-CM

## 2022-05-27 NOTE — Assessment & Plan Note (Signed)
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 Oncotype DX: Low risk no role of chemo 2. Adjuvant radiation therapy completed 03/03/2021 3. Adjuvant antiestrogen therapy started 03/19/2021 discontinued 05/03/2021 (allergic reaction to Tamoxifen) -------------------------------------------------------------------------------------------------------------- Breast cancer surveillance: Breast MRI 09/04/2021: Benign breast density category D Mammogram 10/21/2021: Marked skin thickening left breast with lymphedema benign breast density category C Breast exam 12/04/2021: Benign   Multiple psychomotor symptoms especially brief lapses in focus and concentration/memory, etc.: Much improved after stopping antiestrogen therapy Severe hot flashes: Much improved after stopping antiestrogen therapy   We will cancel the CT chest abdomen pelvis   Continue with annual mammogram and breast MRI surveillance Papillary thyroid carcinoma: 05/01/2022 left thyroid lobectomy: 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

## 2022-06-02 ENCOUNTER — Ambulatory Visit: Attending: Hematology and Oncology | Admitting: Rehabilitation

## 2022-06-02 DIAGNOSIS — Z483 Aftercare following surgery for neoplasm: Secondary | ICD-10-CM | POA: Insufficient documentation

## 2022-06-02 DIAGNOSIS — C50412 Malignant neoplasm of upper-outer quadrant of left female breast: Secondary | ICD-10-CM | POA: Insufficient documentation

## 2022-06-02 DIAGNOSIS — Z170 Estrogen receptor positive status [ER+]: Secondary | ICD-10-CM | POA: Insufficient documentation

## 2022-06-02 NOTE — Therapy (Addendum)
OUTPATIENT PHYSICAL THERAPY SOZO SCREENING NOTE   Patient Name: Gabriela Evans MRN: [id number] DOB:08/20/1980, 42 y.o., female Today's Date: 06/02/2022  PCP: Serena Croissant, MD REFERRING PROVIDER: Serena Croissant, MD   PT End of Session - 06/02/22 1029     Visit Number 5   screen only   PT Start Time 1000    PT Stop Time 1009    PT Time Calculation (min) 9 min    Activity Tolerance Patient tolerated treatment well    Behavior During Therapy WFL for tasks assessed/performed             Past Medical History:  Diagnosis Date   Anemia    Breast cancer (HCC)    Complication of anesthesia    nausea   Gestational diabetes    Kidney infection    Other abnormal glucose    Gestational diabetes with both children   Personal history of radiation therapy    Pneumonia    01-2019   PONV (postoperative nausea and vomiting)    Past Surgical History:  Procedure Laterality Date   APPENDECTOMY     BREAST LUMPECTOMY     BREAST LUMPECTOMY WITH RADIOACTIVE SEED AND SENTINEL LYMPH NODE BIOPSY Left 11/29/2020   Procedure: LEFT BREAST LUMPECTOMY WITH RADIOACTIVE SEED AND SENTINEL LYMPH NODE BIOPSY;  Surgeon: Griselda Miner, MD;  Location: [facility];  Service: General;  Laterality: Left;   CESAREAN SECTION     X2   IUD inserted in OR     LAPAROSCOPIC APPENDECTOMY N/A 07/08/2021   Procedure: APPENDECTOMY LAPAROSCOPIC;  Surgeon: Leafy Ro, MD;  Location: [facility];  Service: General;  Laterality: N/A;   PELVIC LAPAROSCOPY  2009   remove perforated IUD   ROBOTIC ASSISTED LAPAROSCOPIC LYSIS OF ADHESION N/A 04/26/2019   Procedure: XI ROBOTIC ASSISTED LAPAROSCOPIC LYSIS OF ADHESION;  Surgeon: Genia Del, MD;  Location: [facility];  Service: Gynecology;  Laterality: N/A;   ROBOTIC ASSISTED TOTAL HYSTERECTOMY WITH BILATERAL SALPINGO OOPHERECTOMY Bilateral 04/26/2019   Procedure: XI ROBOTIC ASSISTED TOTAL LAPAROSCOPIC HYSTERECTOMY WITH BILATERAL  SALPINGECTOMY;  Surgeon: Genia Del, MD;  Location: [facility];  Service: Gynecology;  Laterality: Bilateral;  requests 8:30 OR start in [place] Gyn block time requests 2 hours   THYROIDECTOMY N/A 05/01/2022   Procedure: TOTAL THYROIDECTOMY, POSSIBLE LEFT THYROID LOBECTOMY;  Surgeon: Darnell Level, MD;  Location: [facility];  Service: General;  Laterality: N/A;   WISDOM TOOTH EXTRACTION     Patient Active Problem List   Diagnosis Date Noted   Neoplasm of uncertain behavior of thyroid gland 04/28/2022   Dysphagia 04/28/2022   Acute appendicitis 07/08/2021   Genetic testing 11/07/2020   Malignant neoplasm of upper-outer quadrant of left breast in female, estrogen receptor positive (HCC) 10/28/2020   Postoperative state 04/26/2019   Menorrhagia 03/21/2018    REFERRING DIAG: left breast cancer at risk for lymphedema  THERAPY DIAG:  Malignant neoplasm of upper-outer quadrant of left breast in female, estrogen receptor positive (HCC)  Aftercare following surgery for neoplasm  PERTINENT HISTORY: Lt lumpectomy with 2 negative lymph nodes removed on 11/29/20.  She underwent a partial hysterectomy (ovaries still remain) in 04/2019. Radiation completed. Hx of antibiotics for possible breast infection   PRECAUTIONS: left UE Lymphedema risk, None  SUBJECTIVE: I am having some increased swelling and tenderness under axilla along side.   PAIN:  Are you having pain? Tenderness to touch along L lateral trunk, pain when raising arm across chest due to  pec tightness 2/10  SOZO SCREENING: Patient was assessed today using the SOZO machine to determine the lymphedema index score. This was compared to her baseline score. It was determined that she is within the recommended range when compared to her baseline and no further action is needed at this time. She will continue SOZO screenings. These are done every 3 months for 2 years post operatively followed by every 6 months for 2 years,  and then annually.     Gwenevere Abbot, PT Performed by Guy Sandifer 06/02/22 10:39 AM

## 2022-06-25 ENCOUNTER — Ambulatory Visit: Admitting: Physician Assistant

## 2022-06-25 ENCOUNTER — Encounter: Payer: Self-pay | Admitting: Physician Assistant

## 2022-06-25 VITALS — BP 132/86 | HR 66 | Ht 69.5 in | Wt 211.0 lb

## 2022-06-25 DIAGNOSIS — R1310 Dysphagia, unspecified: Secondary | ICD-10-CM | POA: Diagnosis not present

## 2022-06-25 NOTE — Progress Notes (Signed)
Chief Complaint: Dysphagia  HPI:  Gabriela Evans is a 42 year old female with a past medical history as listed below including breast cancer and multiple others, who was referred to me by Serena Croissant, MD for a complaint of dysphagia.      04/23/2022 modified barium swallow with speech path done for feelings of something "caught" in her throat at times and increased throat clearing.  At that time there was strong suspicion that the patient's complaints may be directly related to the esophageal phase dysmotility seen on exam that day.  They recommended follow-up with [place] for further discussion and possible manometry.    05/27/2022 patient seen by oncology for history of breast cancer.  She had previously undergone surgery and radiation ending in February 2023.  At that time discussed a papillary thyroid carcinoma which for which she had undergone a left thyroid lobectomy 05/01/2022.    Today, the patient tells me that since November of last year she has noticed some intermittent trouble with food going down her throat.  Tells me that it feels like she will chew really well and then swallow and then the food just "Camps out in my esophagus".  This was some better after partial thyroidectomy in April, but it continues to be an issue for her.  Does tell me that she had many radiation treatments on her breast and thinks maybe that could have caused some issues.  No heartburn reflux or abdominal pain.    Denies fever, chills, weight loss or blood in her stool.  Past Medical History:  Diagnosis Date   Anemia    Breast cancer (HCC)    Complication of anesthesia    nausea   Gestational diabetes    Kidney infection    Other abnormal glucose    Gestational diabetes with both children   Personal history of radiation therapy    Pneumonia    01-2019   PONV (postoperative nausea and vomiting)     Past Surgical History:  Procedure Laterality Date   APPENDECTOMY     BREAST LUMPECTOMY     BREAST LUMPECTOMY  WITH RADIOACTIVE SEED AND SENTINEL LYMPH NODE BIOPSY Left 11/29/2020   Procedure: LEFT BREAST LUMPECTOMY WITH RADIOACTIVE SEED AND SENTINEL LYMPH NODE BIOPSY;  Surgeon: Griselda Miner, MD;  Location: [facility];  Service: General;  Laterality: Left;   CESAREAN SECTION     X2   IUD inserted in OR     LAPAROSCOPIC APPENDECTOMY N/A 07/08/2021   Procedure: APPENDECTOMY LAPAROSCOPIC;  Surgeon: Leafy Ro, MD;  Location: [facility];  Service: General;  Laterality: N/A;   PELVIC LAPAROSCOPY  2009   remove perforated IUD   ROBOTIC ASSISTED LAPAROSCOPIC LYSIS OF ADHESION N/A 04/26/2019   Procedure: XI ROBOTIC ASSISTED LAPAROSCOPIC LYSIS OF ADHESION;  Surgeon: Genia Del, MD;  Location: [facility];  Service: Gynecology;  Laterality: N/A;   ROBOTIC ASSISTED TOTAL HYSTERECTOMY WITH BILATERAL SALPINGO OOPHERECTOMY Bilateral 04/26/2019   Procedure: XI ROBOTIC ASSISTED TOTAL LAPAROSCOPIC HYSTERECTOMY WITH BILATERAL SALPINGECTOMY;  Surgeon: Genia Del, MD;  Location: [facility];  Service: Gynecology;  Laterality: Bilateral;  requests 8:30 OR start in [place] Gyn block time requests 2 hours   THYROIDECTOMY N/A 05/01/2022   Procedure: TOTAL THYROIDECTOMY, POSSIBLE LEFT THYROID LOBECTOMY;  Surgeon: Darnell Level, MD;  Location: [facility];  Service: General;  Laterality: N/A;   WISDOM TOOTH EXTRACTION      Current Outpatient Medications  Medication Sig Dispense Refill   Charcoal Activated (  ACTIVATED CHARCOAL) POWD Take 1 packet by mouth as needed (gi upset).     hydrocortisone cream 1 % Apply 1 Application topically as needed for itching.     ibuprofen (ADVIL) 200 MG tablet Take 200 mg by mouth as needed for moderate pain.     Multiple Vitamins-Minerals (MULTIVITAMIN WITH MINERALS) tablet Take 3 tablets by mouth daily.     Probiotic Product (PROBIOTIC PO) Take 1 tablet by mouth daily.     No current facility-administered medications for this  visit.    Allergies as of 06/25/2022 - Review Complete 06/25/2022  Allergen Reaction Noted   Nickel Swelling 04/27/2017   Gluten meal Nausea Only 04/29/2022   Milk-related compounds Nausea And Vomiting 04/29/2022   Other  11/29/2020   Tramadol Nausea Only and Swelling 11/29/2020   Oxycodone-acetaminophen Itching 04/27/2017   Tamoxifen Itching and Rash 05/29/2021    Family History  Problem Relation Age of Onset   Diabetes Mother    Thyroid disease Father    Diabetes Father    Thyroid disease Sister    Thyroid cancer Maternal Aunt        papillary; dx 67s   Brain cancer Maternal Uncle 2       gliolblastoma   Rectal cancer Paternal Uncle        mets; dx 39s   Cancer Paternal Uncle        unknown type; dx 72s   Thyroid cancer Maternal Grandmother        follicular   Prostate cancer Maternal Grandfather        dx 78s   Melanoma Paternal Grandmother        mets; dx 34s    Social History   Socioeconomic History   Marital status: Married    Spouse name: Not on file   Number of children: Not on file   Years of education: Not on file   Highest education level: Not on file  Occupational History   Not on file  Tobacco Use   Smoking status: Never   Smokeless tobacco: Never  Vaping Use   Vaping Use: Never used  Substance and Sexual Activity   Alcohol use: Not Currently    Comment: occas   Drug use: No   Sexual activity: Yes    Birth control/protection: Surgical    Comment: Hyst-INTERCOURSE AGE 82, LESS THA N 5 SEXUAL PARTNERS  Other Topics Concern   Not on file  Social History Narrative   Not on file   Social Determinants of Health   Financial Resource Strain: Not on file  Food Insecurity: No Food Insecurity (05/01/2022)   Hunger Vital Sign    Worried About Running Out of Food in the Last Year: Never true    Ran Out of Food in the Last Year: Never true  Transportation Needs: No Transportation Needs (05/01/2022)   PRAPARE - Scientist, research (physical sciences) (Medical): No    Lack of Transportation (Non-Medical): No  Physical Activity: Not on file  Stress: Not on file  Social Connections: Not on file  Intimate Partner Violence: Not At Risk (05/01/2022)   Humiliation, Afraid, Rape, and Kick questionnaire    Fear of Current or Ex-Partner: No    Emotionally Abused: No    Physically Abused: No    Sexually Abused: No    Review of Systems:    Constitutional: No weight loss, fever or chills Skin: No rash  Cardiovascular: No chest pain Respiratory: No SOB  Gastrointestinal: See  HPI and otherwise negative Genitourinary: No dysuria  Neurological: No headache, dizziness or syncope Musculoskeletal: No new muscle or joint pain Hematologic: No bleeding  Psychiatric: No history of depression or anxiety   Physical Exam:  Vital signs: BP 132/86   Pulse 66   Ht 5' 9.5" (1.765 m)   Wt 211 lb (95.7 kg)   LMP 04/18/2019   SpO2 99%   BMI 30.71 kg/m    Constitutional:   Pleasant Caucasian female appears to be in NAD, Well developed, Well nourished, alert and cooperative Head:  Normocephalic and atraumatic. Eyes:   PEERL, EOMI. No icterus. Conjunctiva pink. Ears:  Normal auditory acuity. Neck:  Supple Throat: Oral cavity and pharynx without inflammation, swelling or lesion.  Respiratory: Respirations even and unlabored. Lungs clear to auscultation bilaterally.   No wheezes, crackles, or rhonchi.  Cardiovascular: Normal S1, S2. No MRG. Regular rate and rhythm. No peripheral edema, cyanosis or pallor.  Gastrointestinal:  Soft, nondistended, nontender. No rebound or guarding. Normal bowel sounds. No appreciable masses or hepatomegaly. Rectal:  Not performed.  Msk:  Symmetrical without gross deformities. Without edema, no deformity or joint abnormality.  Neurologic:  Alert and  oriented x4;  grossly normal neurologically.  Skin:   Dry and intact without significant lesions or rashes. Psychiatric: Demonstrates good judgement and reason  without abnormal affect or behaviors.  RELEVANT LABS AND IMAGING: CBC    Component Value Date/Time   WBC 10.0 04/29/2022 1344   RBC 4.39 04/29/2022 1344   HGB 13.0 04/29/2022 1344   HGB 12.3 02/18/2021 1422   HGB 12.3 03/19/2017 1456   HCT 39.6 04/29/2022 1344   HCT 37.5 03/19/2017 1456   PLT 258 04/29/2022 1344   PLT 262 02/18/2021 1422   PLT 283 03/19/2017 1456   MCV 90.2 04/29/2022 1344   MCV 87 03/19/2017 1456   MCH 29.6 04/29/2022 1344   MCHC 32.8 04/29/2022 1344   RDW 11.9 04/29/2022 1344   RDW 13.3 03/19/2017 1456   LYMPHSABS 1.3 02/18/2021 1422   MONOABS 0.6 02/18/2021 1422   EOSABS 0.2 02/18/2021 1422   BASOSABS 0.0 02/18/2021 1422    CMP     Component Value Date/Time   NA 141 07/08/2021 0844   K 4.0 07/08/2021 0844   CL 106 07/08/2021 0844   CO2 27 07/08/2021 0844   GLUCOSE 104 (H) 07/08/2021 0844   BUN 13 07/08/2021 0844   CREATININE 0.64 07/08/2021 0844   CREATININE 0.70 02/18/2021 1422   CREATININE 0.72 10/08/2020 1229   CALCIUM 9.4 07/08/2021 0844   PROT 7.3 07/08/2021 0844   ALBUMIN 4.0 07/08/2021 0844   AST 15 07/08/2021 0844   AST 16 02/18/2021 1422   ALT 17 07/08/2021 0844   ALT 14 02/18/2021 1422   ALKPHOS 60 07/08/2021 0844   BILITOT 0.9 07/08/2021 0844   BILITOT 0.3 02/18/2021 1422   GFRNONAA >60 07/08/2021 0844   GFRNONAA >60 02/18/2021 1422    Assessment: 1.  Dysphagia: Modified barium swallow in April showed likely esophageal dysmotility, patient does have history of breast cancer and radiation treatments, symptoms some better since partial thyroidectomy; likely dysmotility +/- stricture  Plan: 1.  Scheduled patient for diagnostic EGD in the [facility] with Dr. Adela Lank.  Did provide the patient with a detailed list of risks for the procedure and she agrees to proceed. Patient is appropriate for endoscopic procedure(s) in the ambulatory ([facility]) setting.  2.  Discussed with patient that pending findings may recommend an esophageal  manometry. 3.  Patient to follow in clinic per recommendations after time of procedure.  Hyacinth Meeker, PA-C [facility] 06/25/2022, 9:20 AM  Cc: Serena Croissant, MD

## 2022-06-25 NOTE — Progress Notes (Signed)
Agree with assessment / plan as outlined.  

## 2022-06-25 NOTE — Patient Instructions (Signed)
You have been scheduled for an endoscopy. Please follow written instructions given to you at your visit today. If you use inhalers (even only as needed), please bring them with you on the day of your procedure.   Due to recent changes in healthcare laws, you may see the results of your imaging and laboratory studies on MyChart before your provider has had a chance to review them.  We understand that in some cases there may be results that are confusing or concerning to you. Not all laboratory results come back in the same time frame and the provider may be waiting for multiple results in order to interpret others.  Please give [place] 48 hours in order for your provider to thoroughly review all the results before contacting the office for clarification of your results.    _______________________________________________________  If your blood pressure at your visit was 140/90 or greater, please contact your primary care physician to follow up on this.  _______________________________________________________  If you are age 70 or older, your body mass index should be between 23-30. Your Body mass index is 30.71 kg/m. If this is out of the aforementioned range listed, please consider follow up with your Primary Care Provider.  If you are age 84 or younger, your body mass index should be between 19-25. Your Body mass index is 30.71 kg/m. If this is out of the aformentioned range listed, please consider follow up with your Primary Care Provider.   ________________________________________________________  The [facility] providers would like to encourage you to use [facility] to communicate with providers for non-urgent requests or questions.  Due to long hold times on the telephone, sending your provider a message by [facility] may be a faster and more efficient way to get a response.  Please allow 48 business hours for a response.  Please remember that this is for non-urgent requests.   _______________________________________________________   I appreciate the  opportunity to care for you  Thank You   Jacelyn Grip

## 2022-07-22 ENCOUNTER — Encounter: Payer: Self-pay | Admitting: Rehabilitation

## 2022-07-22 ENCOUNTER — Encounter: Payer: Self-pay | Admitting: Gastroenterology

## 2022-07-22 ENCOUNTER — Ambulatory Visit (AMBULATORY_SURGERY_CENTER): Admitting: Gastroenterology

## 2022-07-22 VITALS — BP 146/78 | HR 58 | Temp 97.5°F | Resp 17 | Ht 69.0 in | Wt 211.0 lb

## 2022-07-22 DIAGNOSIS — R1310 Dysphagia, unspecified: Secondary | ICD-10-CM

## 2022-07-22 DIAGNOSIS — K219 Gastro-esophageal reflux disease without esophagitis: Secondary | ICD-10-CM | POA: Diagnosis not present

## 2022-07-22 MED ORDER — SODIUM CHLORIDE 0.9 % IV SOLN
500.0000 mL | Freq: Once | INTRAVENOUS | Status: DC
Start: 2022-07-22 — End: 2022-07-22

## 2022-07-22 NOTE — Patient Instructions (Signed)
YOU HAD AN ENDOSCOPIC PROCEDURE TODAY AT THE [facility]:   Refer to the procedure report that was given to you for any specific questions about what was found during the examination.  If the procedure report does not answer your questions, please call your gastroenterologist to clarify.  If you requested that your care partner not be given the details of your procedure findings, then the procedure report has been included in a sealed envelope for you to review at your convenience later.  YOU SHOULD EXPECT: Some feelings of bloating in the abdomen. Passage of more gas than usual.  Walking can help get rid of the air that was put into your GI tract during the procedure and reduce the bloating. If you had a lower endoscopy (such as a colonoscopy or flexible sigmoidoscopy) you may notice spotting of blood in your stool or on the toilet paper. If you underwent a bowel prep for your procedure, you may not have a normal bowel movement for a few days.  Please Note:  You might notice some irritation and congestion in your nose or some drainage.  This is from the oxygen used during your procedure.  There is no need for concern and it should clear up in a day or so.  SYMPTOMS TO REPORT IMMEDIATELY:   Following upper endoscopy (EGD)  Vomiting of blood or coffee ground material  New chest pain or pain under the shoulder blades  Painful or persistently difficult swallowing  New shortness of breath  Fever of 100F or higher  Black, tarry-looking stools  For urgent or emergent issues, a gastroenterologist can be reached at any hour by calling [phone]. Do not use MyChart messaging for urgent concerns.    DIET:  We do recommend a small meal at first, but then you may proceed to your regular diet.  Drink plenty of fluids but you should avoid alcoholic beverages for 24 hours.  ACTIVITY:  You should plan to take it easy for the rest of today and you should NOT DRIVE or use heavy machinery  until tomorrow (because of the sedation medicines used during the test).    FOLLOW UP: Our staff will call the number listed on your records the next business day following your procedure.  We will call around 7:15- 8:00 am to check on you and address any questions or concerns that you may have regarding the information given to you following your procedure. If we do not reach you, we will leave a message.     If any biopsies were taken you will be contacted by phone or by letter within the next 1-3 weeks.  Please call us at [phone] if you have not heard about the biopsies in 3 weeks.    SIGNATURES/CONFIDENTIALITY: You and/or your care partner have signed paperwork which will be entered into your electronic medical record.  These signatures attest to the fact that that the information above on your After Visit Summary has been reviewed and is understood.  Full responsibility of the confidentiality of this discharge information lies with you and/or your care-partner. 

## 2022-07-22 NOTE — Op Note (Signed)
[facility] Patient Name: Gabriela Evans Procedure Date: 07/22/2022 7:29 AM MRN: [id number] Endoscopist: Viviann Spare P. Adela Lank , MD, [id number] Age: 42 Referring MD:  Date of Birth: 21-Feb-1980 Gender: Female Account #: [id number] Procedure:                Upper GI endoscopy Indications:              Dysphagia, history of breast cancer s/p radiation                            therapy Medicines:                Monitored Anesthesia Care Procedure:                Pre-Anesthesia Assessment:                           - Prior to the procedure, a History and Physical                            was performed, and patient medications and                            allergies were reviewed. The patient's tolerance of                            previous anesthesia was also reviewed. The risks                            and benefits of the procedure and the sedation                            options and risks were discussed with the patient.                            All questions were answered, and informed consent                            was obtained. Prior Anticoagulants: The patient has                            taken no anticoagulant or antiplatelet agents. ASA                            Grade Assessment: II - A patient with mild systemic                            disease. After reviewing the risks and benefits,                            the patient was deemed in satisfactory condition to                            undergo the procedure.  After obtaining informed consent, the endoscope was                            passed under direct vision. Throughout the                            procedure, the patient's blood pressure, pulse, and                            oxygen saturations were monitored continuously. The                            GIF [id number] #[id number] was introduced through the                            mouth, and advanced to the second part of  duodenum.                            The upper GI endoscopy was accomplished without                            difficulty. The patient tolerated the procedure                            well. Scope In: Scope Out: Findings:                 Esophagogastric landmarks were identified: the                            Z-line was found at 38 cm, the gastroesophageal                            junction was found at 38 cm and the upper extent of                            the gastric folds was found at 38 cm from the                            incisors.                           The exam of the esophagus was otherwise normal. No                            focal stenosis / stricture.                           A guidewire was placed and the scope was withdrawn.                            Dilation was performed in the entire esophagus with  a Savary dilator with mild resistance at 15 mm, 16                            mm and then at 17 mm. Initially there was                            resistance to a 17mm trial which did not pass,                            started with 15mm which passed with mild resistance                            and then 16mm and 17mm were able to be passed with                            mild resistance. Relook endoscopy showed no mucosal                            wrents. Biopsies were obtained from the proximal                            and distal esophagus with cold forceps to rule out                            eosinophilic esophagitis.                           The entire examined stomach was normal.                           The examined duodenum was normal. Complications:            No immediate complications. Estimated blood loss:                            Minimal. Estimated Blood Loss:     Estimated blood loss was minimal. Impression:               - Esophagogastric landmarks identified.                           - Normal esophagus  otherwise - no focal stenosis /                            stricture but empirically dilated to 17mm. Biopsies                            taken to ensure no EoE.                           - Normal stomach.                           - Normal examined duodenum. Recommendation:           -  Patient has a contact number available for                            emergencies. The signs and symptoms of potential                            delayed complications were discussed with the                            patient. Return to normal activities tomorrow.                            Written discharge instructions were provided to the                            patient.                           - Resume previous diet.                           - Continue present medications.                           - Await pathology results and course post dilation Dayvian Blixt P. Adela Lank, MD 07/22/2022 8:27:38 AM This report has been signed electronically.

## 2022-07-22 NOTE — Progress Notes (Signed)
Pt's states no medical or surgical changes since previsit or office visit. 

## 2022-07-22 NOTE — Progress Notes (Signed)
To pacu, VSS. Report to [email or web address] 

## 2022-07-22 NOTE — Progress Notes (Signed)
Called to room to assist during endoscopic procedure.  Patient ID and intended procedure confirmed with present staff. Received instructions for my participation in the procedure from the performing physician.  

## 2022-07-22 NOTE — Progress Notes (Signed)
[facility] History and Physical   Primary Care Physician:  Patient, No Pcp Per   Reason for Procedure:   dysphagia  Plan:    EGD with dilation     HPI: Gabriela Evans is a 42 y.o. female  here for EGD to further evaluate symptoms of dysphagia. Refer to note on 06/25/22 for full details of her case. No interval changes.   Otherwise feels well without any cardiopulmonary symptoms.   I have discussed risks / benefits of anesthesia and endoscopic procedure with Gabriela Evans and they wish to proceed with the exams as outlined today.    Past Medical History:  Diagnosis Date   Anemia    Breast cancer (HCC)    Complication of anesthesia    nausea   Gestational diabetes    Kidney infection    Other abnormal glucose    Gestational diabetes with both children   Personal history of radiation therapy    Pneumonia    01-2019   PONV (postoperative nausea and vomiting)     Past Surgical History:  Procedure Laterality Date   APPENDECTOMY     BREAST LUMPECTOMY     BREAST LUMPECTOMY WITH RADIOACTIVE SEED AND SENTINEL LYMPH NODE BIOPSY Left 11/29/2020   Procedure: LEFT BREAST LUMPECTOMY WITH RADIOACTIVE SEED AND SENTINEL LYMPH NODE BIOPSY;  Surgeon: Griselda Miner, MD;  Location: [facility];  Service: General;  Laterality: Left;   CESAREAN SECTION     X2   IUD inserted in OR     LAPAROSCOPIC APPENDECTOMY N/A 07/08/2021   Procedure: APPENDECTOMY LAPAROSCOPIC;  Surgeon: Leafy Ro, MD;  Location: [facility];  Service: General;  Laterality: N/A;   PELVIC LAPAROSCOPY  2009   remove perforated IUD   ROBOTIC ASSISTED LAPAROSCOPIC LYSIS OF ADHESION N/A 04/26/2019   Procedure: XI ROBOTIC ASSISTED LAPAROSCOPIC LYSIS OF ADHESION;  Surgeon: Genia Del, MD;  Location: [facility];  Service: Gynecology;  Laterality: N/A;   ROBOTIC ASSISTED TOTAL HYSTERECTOMY WITH BILATERAL SALPINGO OOPHERECTOMY Bilateral 04/26/2019   Procedure: XI ROBOTIC  ASSISTED TOTAL LAPAROSCOPIC HYSTERECTOMY WITH BILATERAL SALPINGECTOMY;  Surgeon: Genia Del, MD;  Location: [facility];  Service: Gynecology;  Laterality: Bilateral;  requests 8:30 OR start in [place] Gyn block time requests 2 hours   THYROIDECTOMY N/A 05/01/2022   Procedure: TOTAL THYROIDECTOMY, POSSIBLE LEFT THYROID LOBECTOMY;  Surgeon: Darnell Level, MD;  Location: [facility];  Service: General;  Laterality: N/A;   WISDOM TOOTH EXTRACTION      Prior to Admission medications   Medication Sig Start Date End Date Taking? Authorizing Provider  Multiple Vitamins-Minerals (MULTIVITAMIN WITH MINERALS) tablet Take 3 tablets by mouth daily.   Yes [provider]  Probiotic Product (PROBIOTIC PO) Take 1 tablet by mouth daily.   Yes [provider]  Charcoal Activated (ACTIVATED CHARCOAL) POWD Take 1 packet by mouth as needed (gi upset).    [provider]  hydrocortisone cream 1 % Apply 1 Application topically as needed for itching.    [provider]  ibuprofen (ADVIL) 200 MG tablet Take 200 mg by mouth as needed for moderate pain.    [provider]  ferrous sulfate 325 (65 FE) MG tablet Take 325 mg by mouth daily with breakfast.  10/16/19  [provider]    Current Outpatient Medications  Medication Sig Dispense Refill   Multiple Vitamins-Minerals (MULTIVITAMIN WITH MINERALS) tablet Take 3 tablets by mouth daily.     Probiotic Product (PROBIOTIC PO)  Take 1 tablet by mouth daily.     Charcoal Activated (ACTIVATED CHARCOAL) POWD Take 1 packet by mouth as needed (gi upset).     hydrocortisone cream 1 % Apply 1 Application topically as needed for itching.     ibuprofen (ADVIL) 200 MG tablet Take 200 mg by mouth as needed for moderate pain.     Current Facility-Administered Medications  Medication Dose Route Frequency Provider Last Rate Last Admin   0.9 %  sodium chloride infusion  500 mL Intravenous Once Cherrell Maybee, Willaim Rayas, MD        Allergies as of 07/22/2022 - Review Complete 07/22/2022  Allergen Reaction Noted   Nickel Swelling 04/27/2017   Gluten meal Nausea Only 04/29/2022   Milk-related compounds Nausea And Vomiting 04/29/2022   Other  11/29/2020   Tramadol Nausea Only and Swelling 11/29/2020   Oxycodone-acetaminophen Itching 04/27/2017   Tamoxifen Itching and Rash 05/29/2021    Family History  Problem Relation Age of Onset   Diabetes Mother    Thyroid disease Father    Diabetes Father    Thyroid disease Sister    Thyroid cancer Maternal Grandmother        follicular   Prostate cancer Maternal Grandfather        dx 58s   Melanoma Paternal Grandmother        mets; dx 1s   Thyroid cancer Maternal Aunt        papillary; dx 71s   Lung cancer Maternal Aunt    Brain cancer Maternal Uncle 2       gliolblastoma   Rectal cancer Paternal Uncle        mets; dx 22s   Cancer Paternal Uncle        unknown type; dx 66s   Esophageal cancer Neg Hx    Pancreatic cancer Neg Hx     Social History   Socioeconomic History   Marital status: Married    Spouse name: Not on file   Number of children: Not on file   Years of education: Not on file   Highest education level: Not on file  Occupational History   Not on file  Tobacco Use   Smoking status: Never   Smokeless tobacco: Never  Vaping Use   Vaping Use: Never used  Substance and Sexual Activity   Alcohol use: Yes    Comment: occas   Drug use: No   Sexual activity: Yes    Birth control/protection: Surgical    Comment: Hyst-INTERCOURSE AGE 31, LESS THA N 5 SEXUAL PARTNERS  Other Topics Concern   Not on file  Social History Narrative   Not on file   Social Determinants of Health   Financial Resource Strain: Not on file  Food Insecurity: No Food Insecurity (05/01/2022)   Hunger Vital Sign    Worried About Running Out of Food in the Last Year: Never true    Ran Out of Food in the Last Year: Never true  Transportation Needs: No  Transportation Needs (05/01/2022)   PRAPARE - Administrator, Civil Service (Medical): No    Lack of Transportation (Non-Medical): No  Physical Activity: Not on file  Stress: Not on file  Social Connections: Not on file  Intimate Partner Violence: Not At Risk (05/01/2022)   Humiliation, Afraid, Rape, and Kick questionnaire    Fear of Current or Ex-Partner: No    Emotionally Abused: No    Physically Abused: No    Sexually Abused: No  Review of Systems: All other review of systems negative except as mentioned in the HPI.  Physical Exam: Vital signs BP 121/76   Pulse 67   Temp (!) 97.5 F (36.4 C)   Ht 5\' 9"  (1.753 m)   Wt 211 lb (95.7 kg)   LMP 04/18/2019   SpO2 100%   BMI 31.16 kg/m   General:   Alert,  Well-developed, pleasant and cooperative in NAD Lungs:  Clear throughout to auscultation.   Heart:  Regular rate and rhythm Abdomen:  Soft, nontender and nondistended.   Neuro/Psych:  Alert and cooperative. Normal mood and affect. A and O x 3  Harlin Rain, MD [facility]

## 2022-07-24 ENCOUNTER — Telehealth: Payer: Self-pay | Admitting: *Deleted

## 2022-07-24 NOTE — Telephone Encounter (Signed)
  Follow up Call-     07/22/2022    7:30 AM  Call back number  Post procedure Call Back phone  # [phone]  Permission to leave phone message Yes     Patient questions:  Do you have a fever, pain , or abdominal swelling? No. Pain Score  0 *  Have you tolerated food without any problems? Yes.    Have you been able to return to your normal activities? Yes.    Do you have any questions about your discharge instructions: Diet   No. Medications  No. Follow up visit  No.  Do you have questions or concerns about your Care? No.  Actions: * If pain score is 4 or above: No action needed, pain <4.

## 2022-07-28 ENCOUNTER — Ambulatory Visit
Admission: RE | Admit: 2022-07-28 | Discharge: 2022-07-28 | Disposition: A | Discharge status: Home / Self Care | Source: Ambulatory Visit | Attending: Hematology and Oncology | Admitting: Hematology and Oncology

## 2022-07-28 DIAGNOSIS — Z170 Estrogen receptor positive status [ER+]: Secondary | ICD-10-CM | POA: Diagnosis present

## 2022-07-28 DIAGNOSIS — C50412 Malignant neoplasm of upper-outer quadrant of left female breast: Secondary | ICD-10-CM | POA: Diagnosis present

## 2022-07-28 LAB — POCT I-STAT CREATININE: Creatinine, Ser: 0.9 mg/dL (ref 0.44–1.00)

## 2022-07-28 MED ORDER — IOHEXOL 300 MG/ML  SOLN
100.0000 mL | Freq: Once | INTRAMUSCULAR | Status: AC | PRN
  Administered 2022-07-28: 100 mL via INTRAVENOUS

## 2022-07-30 ENCOUNTER — Telehealth: Payer: Self-pay | Admitting: Hematology and Oncology

## 2022-07-30 NOTE — Telephone Encounter (Signed)
I informed the patient that the CT chest abdomen pelvis were normal No evidence of metastatic disease.

## 2022-09-02 ENCOUNTER — Other Ambulatory Visit: Payer: Self-pay

## 2022-09-02 ENCOUNTER — Encounter: Payer: Self-pay | Admitting: Hematology and Oncology

## 2022-09-02 DIAGNOSIS — C50412 Malignant neoplasm of upper-outer quadrant of left female breast: Secondary | ICD-10-CM

## 2022-09-14 ENCOUNTER — Ambulatory Visit: Attending: Hematology and Oncology

## 2022-09-14 DIAGNOSIS — Z483 Aftercare following surgery for neoplasm: Secondary | ICD-10-CM | POA: Insufficient documentation

## 2022-09-14 DIAGNOSIS — Z170 Estrogen receptor positive status [ER+]: Secondary | ICD-10-CM | POA: Insufficient documentation

## 2022-09-14 DIAGNOSIS — C50412 Malignant neoplasm of upper-outer quadrant of left female breast: Secondary | ICD-10-CM | POA: Insufficient documentation

## 2022-10-15 ENCOUNTER — Ambulatory Visit
Admission: EM | Admit: 2022-10-15 | Discharge: 2022-10-15 | Disposition: A | Discharge status: Home / Self Care | Attending: Emergency Medicine | Admitting: Emergency Medicine

## 2022-10-15 ENCOUNTER — Ambulatory Visit (INDEPENDENT_AMBULATORY_CARE_PROVIDER_SITE_OTHER)

## 2022-10-15 DIAGNOSIS — J189 Pneumonia, unspecified organism: Secondary | ICD-10-CM | POA: Diagnosis not present

## 2022-10-15 MED ORDER — BENZONATATE 100 MG PO CAPS: 200.0000 mg | ORAL_CAPSULE | Freq: Three times a day (TID) | ORAL | 0 refills | Status: DC

## 2022-10-15 MED ORDER — ALBUTEROL SULFATE HFA 108 (90 BASE) MCG/ACT IN AERS: 2.0000 | INHALATION_SPRAY | RESPIRATORY_TRACT | 0 refills | Status: DC | PRN

## 2022-10-15 MED ORDER — LEVOFLOXACIN 500 MG PO TABS: 500.0000 mg | ORAL_TABLET | Freq: Every day | ORAL | 0 refills | Status: DC

## 2022-10-15 MED ORDER — PROMETHAZINE-DM 6.25-15 MG/5ML PO SYRP: 5.0000 mL | ORAL_SOLUTION | Freq: Four times a day (QID) | ORAL | 0 refills | Status: DC | PRN

## 2022-10-15 MED ORDER — AEROCHAMBER MV MISC: 2 refills | Status: DC

## 2022-10-15 NOTE — ED Triage Notes (Signed)
Pt c/o productive cough, headaches x6days  Pt states that she hasn't had much congestion but has been waking up with headaches and irritating cough  Pt states that she has a history of pneumonia.

## 2022-10-15 NOTE — ED Provider Notes (Signed)
[facility]    CSN: [id number] Arrival date & time: 10/15/22  4098      History   Chief Complaint Chief Complaint  Patient presents with   Cough    HPI Gabriela Evans is a 42 y.o. female.   HPI  42 year old female with a past medical history that is significant for breast cancer, thyroid cancer status post total thyroidectomy, anemia, and pneumonia presents for evaluation of 6 days worth of respiratory symptoms.  She reports that for the first 2 days she had a headache but that has resolved but she continues to have minimal runny nose and nasal congestion, ear fullness, sore throat that she attributes to coughing, a cough that is intermittently productive for yellow sputum, and wheezing.  She denies any fever or shortness of breath.  She has not tested for COVID and she denies any sick contacts or recent travel.  She is concerned because she has a prior history of pneumonia.  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   Dysphagia 04/28/2022   Acute appendicitis 07/08/2021   Genetic testing 11/07/2020   Malignant neoplasm of upper-outer quadrant of left breast in female, estrogen receptor positive (HCC) 10/28/2020   Postoperative state 04/26/2019   Menorrhagia 03/21/2018    Past Surgical History:  Procedure Laterality Date   APPENDECTOMY     BREAST LUMPECTOMY     BREAST LUMPECTOMY WITH RADIOACTIVE SEED AND SENTINEL LYMPH NODE BIOPSY Left 11/29/2020   Procedure: LEFT BREAST LUMPECTOMY WITH RADIOACTIVE SEED AND SENTINEL LYMPH NODE BIOPSY;  Surgeon: Griselda Miner, MD;  Location: [facility];  Service: General;  Laterality: Left;   CESAREAN SECTION     X2   IUD inserted in OR     LAPAROSCOPIC APPENDECTOMY N/A 07/08/2021   Procedure: APPENDECTOMY LAPAROSCOPIC;  Surgeon: Leafy Ro, MD;  Location: [facility];  Service: General;  Laterality: N/A;   PELVIC LAPAROSCOPY  2009   remove perforated IUD   ROBOTIC ASSISTED LAPAROSCOPIC LYSIS OF ADHESION N/A 04/26/2019   Procedure: XI ROBOTIC ASSISTED LAPAROSCOPIC LYSIS OF ADHESION;  Surgeon: Genia Del, MD;  Location: [facility];  Service: Gynecology;  Laterality: N/A;   ROBOTIC ASSISTED TOTAL HYSTERECTOMY WITH BILATERAL SALPINGO OOPHERECTOMY Bilateral 04/26/2019   Procedure: XI ROBOTIC ASSISTED TOTAL LAPAROSCOPIC HYSTERECTOMY WITH BILATERAL SALPINGECTOMY;  Surgeon: Genia Del, MD;  Location: [facility] [place];  Service: Gynecology;  Laterality: Bilateral;  requests 8:30 OR start in [place] Gyn block time requests 2 hours   THYROIDECTOMY N/A 05/01/2022   Procedure: TOTAL THYROIDECTOMY, POSSIBLE LEFT THYROID LOBECTOMY;  Surgeon: Darnell Level, MD;  Location: [facility];  Service: General;  Laterality: N/A;   WISDOM TOOTH EXTRACTION      OB History     Gravida  2   Para  2   Term  1   Preterm      AB  0   Living  2      SAB  IAB      Ectopic  0   Multiple      Live Births               Home Medications    Prior to Admission medications   Medication Sig Start Date End Date Taking? Authorizing Provider  albuterol (VENTOLIN HFA) 108 (90 Base) MCG/ACT inhaler Inhale 2 puffs into the lungs every 4 (four) hours as needed. 10/15/22  Yes Becky Augusta, NP  benzonatate (TESSALON) 100 MG capsule Take 2 capsules (200 mg total) by mouth every 8 (eight) hours. 10/15/22  Yes Becky Augusta, NP  Charcoal Activated (ACTIVATED CHARCOAL) POWD Take 1 packet by mouth as needed (gi upset).   Yes [provider]  hydrocortisone cream 1 %  Apply 1 Application topically as needed for itching.   Yes [provider]  ibuprofen (ADVIL) 200 MG tablet Take 200 mg by mouth as needed for moderate pain.   Yes [provider]  levofloxacin (LEVAQUIN) 500 MG tablet Take 1 tablet (500 mg total) by mouth daily. 10/15/22  Yes Becky Augusta, NP  Multiple Vitamins-Minerals (MULTIVITAMIN WITH MINERALS) tablet Take 3 tablets by mouth daily.   Yes [provider]  Probiotic Product (PROBIOTIC PO) Take 1 tablet by mouth daily.   Yes [provider]  promethazine-dextromethorphan (PROMETHAZINE-DM) 6.25-15 MG/5ML syrup Take 5 mLs by mouth 4 (four) times daily as needed. 10/15/22  Yes Becky Augusta, NP  Spacer/Aero-Holding Deretha Emory (AEROCHAMBER MV) inhaler Use as instructed 10/15/22  Yes Becky Augusta, NP  ferrous sulfate 325 (65 FE) MG tablet Take 325 mg by mouth daily with breakfast.  10/16/19  [provider]    Family History Family History  Problem Relation Age of Onset   Diabetes Mother    Thyroid disease Father    Diabetes Father    Thyroid disease Sister    Thyroid cancer Maternal Grandmother        follicular   Prostate cancer Maternal Grandfather        dx 52s   Melanoma Paternal Grandmother        mets; dx 75s   Thyroid cancer Maternal Aunt        papillary; dx 61s   Lung cancer Maternal Aunt    Brain cancer Maternal Uncle 2       gliolblastoma   Rectal cancer Paternal Uncle        mets; dx 73s   Cancer Paternal Uncle        unknown type; dx 29s   Esophageal cancer Neg Hx    Pancreatic cancer Neg Hx     Social History Social History   Tobacco Use   Smoking status: Never   Smokeless tobacco: Never  Vaping Use   Vaping status: Never Used  Substance Use Topics   Alcohol use: Yes    Comment: occas   Drug use: No     Allergies   Nickel, Gluten meal, Milk-related compounds, Other, Tramadol, Oxycodone-acetaminophen, and Tamoxifen   Review of Systems Review of Systems   Constitutional:  Negative for fever.  HENT:  Positive for congestion, ear pain, rhinorrhea and sore throat.   Respiratory:  Positive for cough and wheezing. Negative for shortness of breath.   Neurological:  Positive for headaches.     Physical Exam Triage Vital Signs ED Triage Vitals  Encounter Vitals Group     BP      Systolic BP Percentile      Diastolic BP Percentile  Pulse      Resp      Temp      Temp src      SpO2      Weight      Height      Head Circumference      Peak Flow      Pain Score      Pain Loc      Pain Education      Exclude from Growth Chart    No data found.  Updated Vital Signs BP 124/88 (BP Location: Left Arm)   Pulse 70   Temp 98.2 F (36.8 C) (Oral)   Ht 5\' 9"  (1.753 m)   Wt 210 lb (95.3 kg)   LMP 04/18/2019   SpO2 100%   BMI 31.01 kg/m   Visual Acuity Right Eye Distance:   Left Eye Distance:   Bilateral Distance:    Right Eye Near:   Left Eye Near:    Bilateral Near:     Physical Exam Vitals and nursing note reviewed.  Constitutional:      Appearance: Normal appearance. She is not ill-appearing.  HENT:     Head: Normocephalic and atraumatic.     Right Ear: Tympanic membrane, ear canal and external ear normal. There is no impacted cerumen.     Left Ear: Tympanic membrane, ear canal and external ear normal. There is no impacted cerumen.     Nose: Congestion and rhinorrhea present.     Comments: Nasal mucosa is mildly edematous but free of erythema.  Scant clear discharge in both nares.  Mild tenderness to maxillary sinuses bilaterally to compression.    Mouth/Throat:     Mouth: Mucous membranes are moist.     Pharynx: Oropharynx is clear. Posterior oropharyngeal erythema present. No oropharyngeal exudate.     Comments: Mild erythema to the posterior oropharynx with clear postnasal drip. Cardiovascular:     Rate and Rhythm: Normal rate and regular rhythm.     Pulses: Normal pulses.     Heart sounds: Normal heart sounds.  No murmur heard.    No friction rub. No gallop.  Pulmonary:     Effort: Pulmonary effort is normal.     Breath sounds: Normal breath sounds. No wheezing, rhonchi or rales.  Musculoskeletal:     Cervical back: Normal range of motion and neck supple. No tenderness.  Lymphadenopathy:     Cervical: No cervical adenopathy.  Skin:    General: Skin is warm and dry.     Capillary Refill: Capillary refill takes less than 2 seconds.     Findings: No erythema or rash.  Neurological:     General: No focal deficit present.     Mental Status: She is alert and oriented to person, place, and time.      UC Treatments / Results  Labs (all labs ordered are listed, but only abnormal results are displayed) Labs Reviewed - No data to display  EKG   Radiology DG Chest 2 View  Result Date: 10/15/2022 CLINICAL DATA:  Intermittently productive cough x 6 days. History of pneumonia. EXAM: CHEST - 2 VIEW COMPARISON:  Chest x-ray 04/29/2022 FINDINGS: The heart and mediastinal contours are within normal limits. Interval development of right middle lobe consolidation. No pulmonary edema. No pleural effusion. No pneumothorax. No acute osseous abnormality.  Left axial lymph node dissection. IMPRESSION: Right middle lobe pneumonia. Followup PA and lateral chest X-ray is recommended in 3-4 weeks following therapy to ensure resolution and exclude underlying  malignancy. Electronically Signed   By: Tish Frederickson M.D.   On: 10/15/2022 10:51    Procedures Procedures (including critical care time)  Medications Ordered in UC Medications - No data to display  Initial Impression / Assessment and Plan / UC Course  I have reviewed the triage vital signs and the nursing notes.  Pertinent labs & imaging results that were available during my care of the patient were reviewed by me and considered in my medical decision making (see chart for details).   Patient is a pleasant, nontoxic-appearing 42 year old female  presenting for evaluation of 6 days with respiratory symptoms as outlined HPI above.  The patient is not in any acute respiratory distress that she is able to speak in full sentences without dyspnea or tachypnea.  Room air oxygen saturation is 100%.  She does have inflammation of her upper respiratory tract but no purulent discharge noted.  Cardiopulmonary exam reveals clear lung sounds in all fields.  Given that she does have a history of pneumonia, and she has had an intermittently productive cough, I will obtain chest x-ray to rule out any acute cardiopulmonary what is triggering the off.  She is 6 days into symptoms so she is outside the therapy 04 antiviral therapy for COVID or flu so I will not test her at this time.  Chest x-ray independently reviewed and evaluated by me.  Pression: There is a patchy infiltrate in the right lower lobe anteriorly.  Cardiomediastinal silhouette is normal.  Radiology overread is pending. Radiology impression states there is a right middle lobe pneumonia and recommend follow-up PA and lateral chest x-ray in 3 to 4 weeks.  I will discharge patient home with a diagnosis of right middle lobe pneumonia started on Levaquin 500 mg once daily for 7 days.  I will also prescribe an albuterol inhaler with a spacer that she can use every 4-6 hours to help with shortness of breath or wheezing.  Tessalon Perles and Promethazine DM cough syrup for cough and congestion.   Final Clinical Impressions(s) / UC Diagnoses   Final diagnoses:  Community acquired pneumonia of right middle lobe of lung     Discharge Instructions      Take the Levaquin 500 milligrams once daily for 7 days for treatment of your pneumonia.  Use the albuterol inhaler with a spacer, 2 puffs every 4-6 hours, as needed for shortness of breath or wheezing.  Use the Tessalon Perles every 8 hours during the day as needed for cough.  Taken with a small sip of water.  These may give you numbness to the base of  your tongue or metallic taste in mouth, this is normal.  Use the Promethazine DM cough syrup at bedtime for cough and congestion as it would make you drowsy.  Return for reevaluation if you have any new or worsening symptoms.  Follow-up with your primary care provider in 4 to 6 weeks for a repeat chest x-ray to ensure resolution of your pneumonia.      ED Prescriptions     Medication Sig Dispense Auth. Provider   benzonatate (TESSALON) 100 MG capsule Take 2 capsules (200 mg total) by mouth every 8 (eight) hours. 21 capsule Becky Augusta, NP   levofloxacin (LEVAQUIN) 500 MG tablet Take 1 tablet (500 mg total) by mouth daily. 7 tablet Becky Augusta, NP   Spacer/Aero-Holding Chambers (AEROCHAMBER MV) inhaler Use as instructed 1 each Becky Augusta, NP   albuterol (VENTOLIN HFA) 108 (90 Base) MCG/ACT inhaler Inhale 2  puffs into the lungs every 4 (four) hours as needed. 18 g Becky Augusta, NP   promethazine-dextromethorphan (PROMETHAZINE-DM) 6.25-15 MG/5ML syrup Take 5 mLs by mouth 4 (four) times daily as needed. 118 mL Becky Augusta, NP      PDMP not reviewed this encounter.   Becky Augusta, NP 10/15/22 1101

## 2022-10-15 NOTE — Discharge Instructions (Addendum)
Take the Levaquin 500 milligrams once daily for 7 days for treatment of your pneumonia.  Use the albuterol inhaler with a spacer, 2 puffs every 4-6 hours, as needed for shortness of breath or wheezing.  Use the Tessalon Perles every 8 hours during the day as needed for cough.  Taken with a small sip of water.  These may give you numbness to the base of your tongue or metallic taste in mouth, this is normal.  Use the Promethazine DM cough syrup at bedtime for cough and congestion as it would make you drowsy.  Return for reevaluation if you have any new or worsening symptoms.  Follow-up with your primary care provider in 4 to 6 weeks for a repeat chest x-ray to ensure resolution of your pneumonia.

## 2022-10-26 ENCOUNTER — Other Ambulatory Visit: Payer: Self-pay | Admitting: Hematology and Oncology

## 2022-10-26 ENCOUNTER — Ambulatory Visit
Admission: RE | Admit: 2022-10-26 | Discharge: 2022-10-26 | Disposition: A | Discharge status: Home / Self Care | Source: Ambulatory Visit | Attending: Hematology and Oncology

## 2022-10-26 ENCOUNTER — Ambulatory Visit
Admission: RE | Admit: 2022-10-26 | Discharge: 2022-10-26 | Disposition: A | Discharge status: Home / Self Care | Source: Ambulatory Visit | Attending: Hematology and Oncology | Admitting: Hematology and Oncology

## 2022-10-26 ENCOUNTER — Ambulatory Visit: Attending: Hematology and Oncology

## 2022-10-26 VITALS — Wt 208.5 lb

## 2022-10-26 DIAGNOSIS — Z170 Estrogen receptor positive status [ER+]: Secondary | ICD-10-CM

## 2022-10-26 DIAGNOSIS — Z483 Aftercare following surgery for neoplasm: Secondary | ICD-10-CM | POA: Insufficient documentation

## 2022-10-26 NOTE — Therapy (Signed)
OUTPATIENT PHYSICAL THERAPY SOZO SCREENING NOTE   Patient Name: Gabriela Evans MRN: [id number] DOB:05-Apr-1980, 42 y.o., female Today's Date: 10/26/2022  PCP: Patient, No Pcp Per REFERRING PROVIDER: Serena Croissant, MD   PT End of Session - 10/26/22 1105     Visit Number 5   # unchanged due to screen only   PT Start Time 1102    PT Stop Time 1113   SOZO had trouble connecting   PT Time Calculation (min) 11 min    Activity Tolerance Patient tolerated treatment well    Behavior During Therapy WFL for tasks assessed/performed             Past Medical History:  Diagnosis Date   Anemia    Breast cancer (HCC)    Complication of anesthesia    nausea   Gestational diabetes    Kidney infection    Other abnormal glucose    Gestational diabetes with both children   Personal history of radiation therapy    Pneumonia    01-2019   PONV (postoperative nausea and vomiting)    Past Surgical History:  Procedure Laterality Date   APPENDECTOMY     BREAST LUMPECTOMY     BREAST LUMPECTOMY WITH RADIOACTIVE SEED AND SENTINEL LYMPH NODE BIOPSY Left 11/29/2020   Procedure: LEFT BREAST LUMPECTOMY WITH RADIOACTIVE SEED AND SENTINEL LYMPH NODE BIOPSY;  Surgeon: Griselda Miner, MD;  Location: [facility];  Service: General;  Laterality: Left;   CESAREAN SECTION     X2   IUD inserted in OR     LAPAROSCOPIC APPENDECTOMY N/A 07/08/2021   Procedure: APPENDECTOMY LAPAROSCOPIC;  Surgeon: Leafy Ro, MD;  Location: [facility];  Service: General;  Laterality: N/A;   PELVIC LAPAROSCOPY  2009   remove perforated IUD   ROBOTIC ASSISTED LAPAROSCOPIC LYSIS OF ADHESION N/A 04/26/2019   Procedure: XI ROBOTIC ASSISTED LAPAROSCOPIC LYSIS OF ADHESION;  Surgeon: Genia Del, MD;  Location: [facility] [place];  Service: Gynecology;  Laterality: N/A;   ROBOTIC ASSISTED TOTAL HYSTERECTOMY WITH BILATERAL SALPINGO OOPHERECTOMY Bilateral 04/26/2019   Procedure: XI ROBOTIC ASSISTED TOTAL  LAPAROSCOPIC HYSTERECTOMY WITH BILATERAL SALPINGECTOMY;  Surgeon: Genia Del, MD;  Location: [facility] [place];  Service: Gynecology;  Laterality: Bilateral;  requests 8:30 OR start in [place] Gyn block time requests 2 hours   THYROIDECTOMY N/A 05/01/2022   Procedure: TOTAL THYROIDECTOMY, POSSIBLE LEFT THYROID LOBECTOMY;  Surgeon: Darnell Level, MD;  Location: [facility];  Service: General;  Laterality: N/A;   WISDOM TOOTH EXTRACTION     Patient Active Problem List   Diagnosis Date Noted   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  She underwent a partial hysterectomy (ovaries still remain) in 04/2019. Radiation completed. Hx of antibiotics for possible breast infection   PRECAUTIONS: left UE Lymphedema risk, None  SUBJECTIVE: Pt returns for her last 3 month L-Dex screen.   PAIN:  Are you having pain? No  SOZO SCREENING: Patient was assessed today using the SOZO machine to determine the lymphedema index score. This was compared to her baseline  score. It was determined that she is within the recommended range when compared to her baseline and no further action is needed at this time. She will continue SOZO screenings. These are done every 3 months for 2 years post operatively followed by every 6 months for 2 years, and then annually.   L-DEX FLOWSHEETS - 10/26/22 1100       L-DEX LYMPHEDEMA SCREENING   Measurement Type Unilateral    L-DEX MEASUREMENT EXTREMITY Upper Extremity    POSITION  Standing     DOMINANT SIDE Right    At Risk Side Left    BASELINE SCORE (UNILATERAL) -3.4             Begin 6 month L-Dex screens next.   Berna Spare, PTA 10/26/22 11:13 AM

## 2022-11-09 ENCOUNTER — Ambulatory Visit: Admission: EM | Admit: 2022-11-09 | Discharge: 2022-11-09 | Disposition: A | Discharge status: Home / Self Care

## 2022-11-09 DIAGNOSIS — S61211A Laceration without foreign body of left index finger without damage to nail, initial encounter: Secondary | ICD-10-CM

## 2022-11-09 DIAGNOSIS — Z23 Encounter for immunization: Secondary | ICD-10-CM

## 2022-11-09 MED ORDER — TETANUS-DIPHTH-ACELL PERTUSSIS 5-2.5-18.5 LF-MCG/0.5 IM SUSY
0.5000 mL | PREFILLED_SYRINGE | Freq: Once | INTRAMUSCULAR | Status: AC
  Administered 2022-11-09: 0.5 mL via INTRAMUSCULAR

## 2022-11-09 MED ORDER — CEPHALEXIN 500 MG PO CAPS: 500.0000 mg | ORAL_CAPSULE | Freq: Two times a day (BID) | ORAL | 0 refills | Status: AC

## 2022-11-09 NOTE — ED Triage Notes (Signed)
Pt c/o laceration of L pointer finger that occurred today. States she trying to cut a bag of chicken & cut herself. Last tetanus unknown.

## 2022-11-09 NOTE — Discharge Instructions (Addendum)
-  Keflex: 1 tablet twice a day for 10 days -Keep wound clean and dry for 24 hours.  After that can wash with gentle with soap and water.  Pat dry. -Further care & instruction as attached

## 2022-11-09 NOTE — ED Provider Notes (Signed)
[facility]    CSN: [id number] Arrival date & time: 11/09/22  1757      History   Chief Complaint Chief Complaint  Patient presents with   Laceration    HPI Gabriela Evans is a 42 y.o. female.   Patient is a 42 year old female who presents with laceration to her left second digit.  Patient was using a knife to open a bag of chicken when she cut her finger.  Patient states she initially had quite bit of bleeding with that has stopped.  Patient states she is not sure when her last tetanus booster was.    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;  Service: General;  Laterality: Left;   CESAREAN SECTION     X2   IUD inserted in OR     LAPAROSCOPIC APPENDECTOMY N/A 07/08/2021   Procedure: APPENDECTOMY LAPAROSCOPIC;  Surgeon: Leafy Ro, MD;  Location: [facility];  Service: General;  Laterality: N/A;   PELVIC LAPAROSCOPY  2009   remove perforated IUD   ROBOTIC  ASSISTED LAPAROSCOPIC LYSIS OF ADHESION N/A 04/26/2019   Procedure: XI ROBOTIC ASSISTED LAPAROSCOPIC LYSIS OF ADHESION;  Surgeon: Genia Del, MD;  Location: [facility] [place];  Service: Gynecology;  Laterality: N/A;   ROBOTIC ASSISTED TOTAL HYSTERECTOMY WITH BILATERAL SALPINGO OOPHERECTOMY Bilateral 04/26/2019   Procedure: XI ROBOTIC ASSISTED TOTAL LAPAROSCOPIC HYSTERECTOMY WITH BILATERAL SALPINGECTOMY;  Surgeon: Genia Del, MD;  Location: [facility] [place];  Service: Gynecology;  Laterality: Bilateral;  requests 8:30 OR start in [place] Gyn block time requests 2 hours   THYROIDECTOMY N/A 05/01/2022   Procedure: TOTAL THYROIDECTOMY, POSSIBLE LEFT THYROID LOBECTOMY;  Surgeon: Darnell Level, MD;  Location: [facility];  Service: General;  Laterality: N/A;   WISDOM TOOTH EXTRACTION      OB History     Gravida  2   Para  2   Term  1   Preterm      AB  0   Living  2      SAB      IAB      Ectopic  0   Multiple      Live Births               Home Medications    Prior to Admission medications   Medication Sig Start Date End Date Taking? Authorizing Provider  albuterol (VENTOLIN HFA) 108 (90  Base) MCG/ACT inhaler Inhale 2 puffs into the lungs every 4 (four) hours as needed. 10/15/22  Yes Becky Augusta, NP  cephALEXin (KEFLEX) 500 MG capsule Take 1 capsule (500 mg total) by mouth 2 (two) times daily for 10 days. 11/09/22 11/19/22 Yes Candis Schatz, PA-C  Charcoal Activated (ACTIVATED CHARCOAL) POWD Take 1 packet by mouth as needed (gi upset).   Yes [provider]  ciclopirox (PENLAC) 8 % solution Apply topically daily. 10/22/22  Yes [provider]  hydrocortisone cream 1 % Apply 1 Application topically as needed for itching.   Yes [provider]  ibuprofen (ADVIL) 200 MG tablet Take 200 mg by mouth as needed for moderate pain.   Yes [provider]  levothyroxine (SYNTHROID) 50 MCG tablet Take 50 mcg by  mouth daily.   Yes [provider]  Multiple Vitamins-Minerals (MULTIVITAMIN WITH MINERALS) tablet Take 3 tablets by mouth daily.   Yes [provider]  Probiotic Product (PROBIOTIC PO) Take 1 tablet by mouth daily.   Yes [provider]  Spacer/Aero-Holding Chambers (AEROCHAMBER MV) inhaler Use as instructed 10/15/22  Yes Becky Augusta, NP  triamcinolone cream (KENALOG) 0.1 % Apply 1 Application topically 2 (two) times daily. 10/07/22  Yes [provider]  benzonatate (TESSALON) 100 MG capsule Take 2 capsules (200 mg total) by mouth every 8 (eight) hours. 10/15/22   Becky Augusta, NP  levofloxacin (LEVAQUIN) 500 MG tablet Take 1 tablet (500 mg total) by mouth daily. 10/15/22   Becky Augusta, NP  promethazine-dextromethorphan (PROMETHAZINE-DM) 6.25-15 MG/5ML syrup Take 5 mLs by mouth 4 (four) times daily as needed. 10/15/22   Becky Augusta, NP  ferrous sulfate 325 (65 FE) MG tablet Take 325 mg by mouth daily with breakfast.  10/16/19  [provider]    Family History Family History  Problem Relation Age of Onset   Diabetes Mother    Thyroid disease Father    Diabetes Father    Thyroid disease Sister    Thyroid cancer Maternal Grandmother        follicular   Prostate cancer Maternal Grandfather        dx 85s   Melanoma Paternal Grandmother        mets; dx 9s   Thyroid cancer Maternal Aunt        papillary; dx 86s   Lung cancer Maternal Aunt    Brain cancer Maternal Uncle 2       gliolblastoma   Rectal cancer Paternal Uncle        mets; dx 27s   Cancer Paternal Uncle        unknown type; dx 58s   Esophageal cancer Neg Hx    Pancreatic cancer Neg Hx     Social History Social History   Tobacco Use   Smoking status: Never   Smokeless tobacco: Never  Vaping Use   Vaping status: Never Used  Substance Use Topics   Alcohol use: Yes    Comment: occas   Drug use: No     Allergies   Nickel, Gluten meal, Milk-related compounds, Other,  Tramadol, Oxycodone-acetaminophen, and Tamoxifen   Review of Systems Review of Systems as noted above in HPI.  Other systems reviewed and found to be negative   Physical Exam Triage Vital Signs ED Triage Vitals  Encounter Vitals Group     BP 11/09/22 1837 129/87     Systolic BP Percentile --      Diastolic BP Percentile --  Pulse Rate 11/09/22 1837 65     Resp 11/09/22 1837 16     Temp 11/09/22 1837 99.8 F (37.7 C)     Temp Source 11/09/22 1837 Oral     SpO2 11/09/22 1837 98 %     Weight 11/09/22 1836 208 lb 8 oz (94.6 kg)     Height 11/09/22 1836 5\' 9"  (1.753 m)     Head Circumference --      Peak Flow --      Pain Score --      Pain Loc --      Pain Education --      Exclude from Growth Chart --    No data found.  Updated Vital Signs BP 129/87 (BP Location: Right Arm)   Pulse 65   Temp 99.8 F (37.7 C) (Oral)   Resp 16   Ht 5\' 9"  (1.753 m)   Wt 208 lb 8 oz (94.6 kg)   LMP 04/18/2019   SpO2 98%   BMI 30.79 kg/m   Visual Acuity Right Eye Distance:   Left Eye Distance:   Bilateral Distance:    Right Eye Near:   Left Eye Near:    Bilateral Near:     Physical Exam Constitutional:      Appearance: Normal appearance.  Musculoskeletal:     Left hand: Laceration present.     Comments: Small subcentimeter laceration to the tip of the left second digit.  No obvious foreign body.  No bleeding at time of exam.  See image  Neurological:     Mental Status: She is alert.      UC Treatments / Results  Labs (all labs ordered are listed, but only abnormal results are displayed) Labs Reviewed - No data to display  EKG   Radiology No results found.  Procedures Laceration Repair  Date/Time: 11/09/2022 7:24 PM  Performed by: Candis Schatz, PA-C Authorized by: Candis Schatz, PA-C   Consent:    Consent obtained:  Verbal Anesthesia:    Anesthesia method:  None Laceration details:    Location:  Finger   Finger location:  L index finger    Length (cm):  0.5 Exploration:    Hemostasis achieved with:  Direct pressure   Imaging outcome: foreign body not noted   Treatment:    Area cleansed with:  Chlorhexidine   Amount of cleaning:  Standard   Visualized foreign bodies/material removed: no     Debridement:  None Skin repair:    Repair method:  Tissue adhesive Approximation:    Approximation:  Close Repair type:    Repair type:  Simple Post-procedure details:    Dressing:  Adhesive bandage  (including critical care time)  Medications Ordered in UC Medications  Tdap (BOOSTRIX) injection 0.5 mL (0.5 mLs Intramuscular Given 11/09/22 1926)    Initial Impression / Assessment and Plan / UC Course  I have reviewed the triage vital signs and the nursing notes.  Pertinent labs & imaging results that were available during my care of the patient were reviewed by me and considered in my medical decision making (see chart for details).    Patient presents with laceration to her left index finger.  Unknown last tetanus booster.  Bleeding stopped prior to arrival.  Wound cleaned with chlorhexidine.  Simple laceration repair with tissue adhesive with close approximation.  Covered with Band-Aid.  Final Clinical Impressions(s) / UC Diagnoses   Final diagnoses:  Laceration of left index finger without foreign body without  damage to nail, initial encounter     Discharge Instructions      -Keflex: 1 tablet twice a day for 10 days -Keep wound clean and dry for 24 hours.  After that can wash with gentle with soap and water.  Pat dry. -Further care & instruction as attached     ED Prescriptions     Medication Sig Dispense Auth. Provider   cephALEXin (KEFLEX) 500 MG capsule Take 1 capsule (500 mg total) by mouth 2 (two) times daily for 10 days. 20 capsule Candis Schatz, PA-C      PDMP not reviewed this encounter.   Candis Schatz, PA-C 11/09/22 1928

## 2023-02-21 ENCOUNTER — Encounter: Payer: Self-pay | Admitting: Hematology and Oncology

## 2023-02-25 ENCOUNTER — Encounter: Payer: Self-pay | Admitting: Adult Health

## 2023-02-25 ENCOUNTER — Ambulatory Visit
Admission: RE | Admit: 2023-02-25 | Discharge: 2023-02-25 | Disposition: A | Discharge status: Home / Self Care | Source: Ambulatory Visit | Attending: Adult Health | Admitting: Adult Health

## 2023-02-25 ENCOUNTER — Inpatient Hospital Stay: Attending: Adult Health | Admitting: Adult Health

## 2023-02-25 VITALS — BP 148/83 | HR 76 | Temp 98.7°F | Resp 18 | Ht 69.0 in | Wt 213.0 lb

## 2023-02-25 DIAGNOSIS — I890 Lymphedema, not elsewhere classified: Secondary | ICD-10-CM | POA: Insufficient documentation

## 2023-02-25 DIAGNOSIS — Z85850 Personal history of malignant neoplasm of thyroid: Secondary | ICD-10-CM | POA: Diagnosis not present

## 2023-02-25 DIAGNOSIS — Z923 Personal history of irradiation: Secondary | ICD-10-CM | POA: Insufficient documentation

## 2023-02-25 DIAGNOSIS — C50412 Malignant neoplasm of upper-outer quadrant of left female breast: Secondary | ICD-10-CM | POA: Insufficient documentation

## 2023-02-25 DIAGNOSIS — R0789 Other chest pain: Secondary | ICD-10-CM

## 2023-02-25 DIAGNOSIS — Z170 Estrogen receptor positive status [ER+]: Secondary | ICD-10-CM

## 2023-02-25 MED ORDER — IOHEXOL 300 MG/ML  SOLN
75.0000 mL | Freq: Once | INTRAMUSCULAR | Status: AC | PRN
  Administered 2023-02-25: 75 mL via INTRAVENOUS

## 2023-02-25 MED ORDER — HYDROCODONE-ACETAMINOPHEN 5-325 MG PO TABS: 1.0000 | ORAL_TABLET | Freq: Four times a day (QID) | ORAL | 0 refills | Status: DC | PRN

## 2023-02-25 MED ORDER — AMOXICILLIN-POT CLAVULANATE 875-125 MG PO TABS: 1.0000 | ORAL_TABLET | Freq: Two times a day (BID) | ORAL | 0 refills | Status: DC

## 2023-02-25 NOTE — Assessment & Plan Note (Signed)
 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 Oncotype DX: Low risk no role of chemo 2. Adjuvant radiation therapy completed 03/03/2021 3. Adjuvant antiestrogen therapy started 03/19/2021 discontinued 05/03/2021 (allergic reaction to Tamoxifen ) -------------------------------------------------------------------------------------------------------------- Breast cancer surveillance: Breast MRI 09/04/2021: Benign breast density category D Mammogram 10/21/2021: Marked skin thickening left breast with lymphedema benign breast density category C Breast exam 12/04/2021: Benign   Multiple psychomotor symptoms especially brief lapses in focus and concentration/memory, etc.: Much improved after stopping antiestrogen therapy Severe hot flashes: Much improved after stopping antiestrogen therapy 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 Pain radiating down the arm with a sensation of coolness and burning. No relief with Tylenol  or heat application. -Refer for urgent CT chest w/contrast -1 aleve and 1 extra strength tylenol  TID PRN -Hydrocodone /Acetaminophen  5-325mg  Q6H PRN ordered for pain unrelieved by aleve/tylenol   -Augmentin  BID x 7 days  Lymphedema Resumed use of lymphedema pump due to discomfort and swelling, despite previous discontinuation due to low numbers. -Consider reevaluation by lymphedema specialist to assess need for continued pump use.  RTC after CT chest.

## 2023-02-25 NOTE — Progress Notes (Signed)
 [facility] Cancer Follow up:    Patient, No Pcp Per No address on file   DIAGNOSIS:  Cancer Staging  Malignant neoplasm of upper-outer quadrant of left breast in female, estrogen receptor positive (HCC) Staging form: Breast, AJCC 8th Edition - Clinical stage from 10/30/2020: Stage IA (cT1b, cN0, cM0, G2, ER+, PR+, HER2-) - Unsigned Stage prefix: Initial diagnosis Histologic grading system: 3 grade system   SUMMARY OF ONCOLOGIC HISTORY: Oncology History  Malignant neoplasm of upper-outer quadrant of left breast in female, estrogen receptor positive (HCC)  10/28/2020 Initial Diagnosis   Presented with new focal thickening and pain her outer left breast. Diagnostic mammogram and US : indeterminate mass in the left breast at 3:00. 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  Breast MRI: 1 prominent lymph node and 9 mm mass     11/06/2020 Genetic Testing   Negative hereditary cancer genetic testing: no pathogenic variants detected in [facility] BRCAPlus Panel and [facility] CancerNext-Expanded +RNAinsight Panel. The report dates are November 06, 2020 and November 10, 2020, respectively.   The BRCAplus panel offered by [employer] and includes sequencing and deletion/duplication analysis for the following 8 genes: ATM, BRCA1, BRCA2, CDH1, CHEK2, PALB2, PTEN, and TP53.  The CancerNext-Expanded gene panel offered by [facility] and includes sequencing, rearrangement, and RNA analysis for the following 77 genes: AIP, ALK, APC, ATM, AXIN2, BAP1, BARD1, BLM, BMPR1A, BRCA1, BRCA2, BRIP1, CDC73, CDH1, CDK4, CDKN1B, CDKN2A, CHEK2, CTNNA1, DICER1, FANCC, FH, FLCN, GALNT12, KIF1B, LZTR1, MAX, MEN1, MET, MLH1, MSH2, MSH3, MSH6, MUTYH, NBN, NF1, NF2, NTHL1, PALB2, PHOX2B, PMS2, POT1, PRKAR1A, PTCH1, PTEN, RAD51C, RAD51D, RB1, RECQL, RET, SDHA, SDHAF2, SDHB, SDHC, SDHD, SMAD4, SMARCA4, SMARCB1, SMARCE1, STK11, SUFU, TMEM127, TP53, TSC1,  TSC2, VHL and XRCC2 (sequencing and deletion/duplication); EGFR, EGLN1, HOXB13, KIT, MITF, PDGFRA, POLD1, and POLE (sequencing only); EPCAM and GREM1 (deletion/duplication only).    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, margins negative, ER 95%, PR 100%, HER2 negative, Ki-67 10%    12/11/2020 Oncotype testing   Oncotype score: 20, distant recurrence at 9 years: 6%   01/14/2021 - 03/03/2021 Radiation Therapy     Site Technique Total Dose (Gy) Dose per Fx (Gy) Completed Fx Beam Energies  Breast, Left: Breast_L 3D 50.4/50.4 1.8 28/28 6X  Breast, Left: Breast_L_Bst 3D 10/10 2 5/5 6X      03/2021 - 04/2021 Anti-estrogen oral therapy   Tamoxifen  daily--unable to tolerate     CURRENT THERAPY: observation  INTERVAL HISTORY:  Discussed the use of AI scribe software for clinical note transcription with the patient, who gave verbal consent to proceed.  Gabriela Evans 43 y.o. female with a history of left breast cancer, presents with recent right breast tenderness and left breast redness and swelling. Initially, she attributed the right breast tenderness to excessive coffee consumption and noticed an improvement after reducing her intake. However, this past Saturday, she noticed redness and swelling in her left breast, her previously affected side. This was accompanied by shoulder pain, which she initially attributed to recent physical activity (laser tagging). The pain, originating from the side of her scapula, did not improve with the use of a massager, heating pad, or Tylenol . The pain has since radiated down her arm, described as a constant feeling of 'icy hot,' with her skin feeling cool when moving but her muscles burning. The pain is particularly intense in two specific areas of her arm  and is so severe that it prevents her from getting comfortable. In an attempt to alleviate the pain, she has resumed using her leukedema  pump, which she had stopped using nine months prior due to low numbers.   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  MEDICAL HISTORY: Past Medical History:  Diagnosis Date   Anemia    Breast cancer (HCC)    Complication of anesthesia    nausea   Gestational diabetes    Kidney infection    Other abnormal glucose    Gestational diabetes with both children   Personal history of radiation therapy    Pneumonia    01-2019   PONV (postoperative nausea and vomiting)     SURGICAL HISTORY: Past Surgical History:  Procedure Laterality Date   APPENDECTOMY     BREAST LUMPECTOMY     BREAST LUMPECTOMY WITH RADIOACTIVE SEED AND SENTINEL LYMPH NODE BIOPSY Left 11/29/2020   Procedure: LEFT BREAST LUMPECTOMY WITH RADIOACTIVE SEED AND SENTINEL LYMPH NODE BIOPSY;  Surgeon: Curvin Deward MOULD, MD;  Location: [facility];  Service: General;  Laterality: Left;   CESAREAN SECTION     X2   IUD inserted in OR     LAPAROSCOPIC APPENDECTOMY N/A 07/08/2021   Procedure: APPENDECTOMY LAPAROSCOPIC;  Surgeon: Jordis Laneta FALCON, MD;  Location: [facility];  Service: General;  Laterality: N/A;   PELVIC LAPAROSCOPY  2009   remove perforated IUD   ROBOTIC ASSISTED LAPAROSCOPIC LYSIS OF ADHESION N/A 04/26/2019   Procedure: XI ROBOTIC ASSISTED LAPAROSCOPIC LYSIS OF ADHESION;  Surgeon: Lavoie, Marie-Lyne, MD;  Location: [facility];  Service: Gynecology;  Laterality: N/A;   ROBOTIC ASSISTED TOTAL HYSTERECTOMY WITH BILATERAL SALPINGO OOPHERECTOMY  Bilateral 04/26/2019   Procedure: XI ROBOTIC ASSISTED TOTAL LAPAROSCOPIC HYSTERECTOMY WITH BILATERAL SALPINGECTOMY;  Surgeon: Lavoie, Marie-Lyne, MD;  Location: [facility];  Service: Gynecology;  Laterality: Bilateral;  requests 8:30 OR start in [place] [facility] block time requests 2 hours   THYROIDECTOMY N/A 05/01/2022   Procedure: TOTAL THYROIDECTOMY, POSSIBLE LEFT THYROID  LOBECTOMY;  Surgeon: Eletha Boas, MD;  Location: [facility];  Service: General;  Laterality: N/A;   WISDOM TOOTH EXTRACTION      SOCIAL HISTORY: Social History   Socioeconomic History   Marital status: Married    Spouse name: Not on file   Number of children: Not on file   Years of education: Not on file   Highest education level: Not on file  Occupational History   Not on file  Tobacco Use   Smoking status: Never   Smokeless tobacco: Never  Vaping Use   Vaping status: Never Used  Substance and Sexual Activity   Alcohol use: Yes    Comment: occas   Drug use: No   Sexual activity: Yes    Birth control/protection: Surgical    Comment: Hyst-INTERCOURSE AGE 63, LESS THA N 5 SEXUAL PARTNERS  Other Topics Concern   Not on file  Social History Narrative   Not on file   Social Drivers of Health   Financial Resource Strain: Low Risk  (12/02/2022)   Received from  [facility]   Overall Financial Resource Strain (CARDIA)    Difficulty of Paying Living Expenses: Not hard at all  Food Insecurity: No Food Insecurity (12/02/2022)   Received from [facility]   Hunger Vital Sign    Worried About Running Out of Food in the Last Year: Never true    Ran Out of Food in the Last Year: Never true  Transportation Needs: No Transportation Needs (12/02/2022)   Received from [facility] - Transportation    In the past 12 months, has lack of transportation kept you from medical appointments or from getting medications?: No    Lack of Transportation  (Non-Medical): No  Physical Activity: Not on file  Stress: Not on file  Social Connections: Not on file  Intimate Partner Violence: Not At Risk (05/01/2022)   Humiliation, Afraid, Rape, and Kick questionnaire    Fear of Current or Ex-Partner: No    Emotionally Abused: No    Physically Abused: No    Sexually Abused: No    FAMILY HISTORY: Family History  Problem Relation Age of Onset   Diabetes Mother    Thyroid  disease Father    Diabetes Father    Thyroid  disease Sister    Thyroid  cancer Maternal Grandmother        follicular   Prostate cancer Maternal Grandfather        dx 54s   Melanoma Paternal Grandmother        mets; dx 41s   Thyroid  cancer Maternal Aunt        papillary; dx 23s   Lung cancer Maternal Aunt    Brain cancer Maternal Uncle 2       gliolblastoma   Rectal cancer Paternal Uncle        mets; dx 29s   Cancer Paternal Uncle        unknown type; dx 62s   Esophageal cancer Neg Hx    Pancreatic cancer Neg Hx     Review of Systems  Constitutional:  Negative for appetite change, chills, fatigue, fever and unexpected weight change.  HENT:   Negative for hearing loss, lump/mass and trouble swallowing.   Eyes:  Negative for eye problems and icterus.  Respiratory:  Negative for chest tightness, cough and shortness of breath.   Cardiovascular:  Negative for chest pain, leg swelling and palpitations.  Gastrointestinal:  Negative for abdominal distention, abdominal pain, constipation, diarrhea, nausea and vomiting.  Endocrine: Negative for hot flashes.  Genitourinary:  Negative for difficulty urinating.   Musculoskeletal:  Negative for arthralgias.  Skin:  Negative for itching and rash.  Neurological:  Negative for dizziness, extremity weakness, headaches and numbness.  Hematological:  Negative for adenopathy. Does not bruise/bleed easily.  Psychiatric/Behavioral:  Negative for depression. The patient is not nervous/anxious.       PHYSICAL  EXAMINATION    Vitals:   02/25/23 1351  BP: (!) 148/83  Pulse: 76  Resp: 18  Temp: 98.7 F (37.1 C)  SpO2: 100%    Physical Exam Constitutional:      General: She is not in acute distress.    Appearance: Normal appearance. She is not toxic-appearing.  HENT:     Head: Normocephalic and atraumatic.     Mouth/Throat:     Mouth: Mucous membranes are moist.     Pharynx: Oropharynx is clear. No oropharyngeal exudate or posterior oropharyngeal erythema.  Eyes:     General: No scleral icterus. Cardiovascular:  Rate and Rhythm: Normal rate and regular rhythm.     Pulses: Normal pulses.     Heart sounds: Normal heart sounds.  Pulmonary:     Effort: Pulmonary effort is normal.     Breath sounds: Normal breath sounds.  Chest:     Comments: Right breast benign, left breast s/p lumpectomy and radiation, mild erythema and swelling noted, no sign of local recurrence noted Abdominal:     General: Abdomen is flat. Bowel sounds are normal. There is no distension.     Palpations: Abdomen is soft.     Tenderness: There is no abdominal tenderness.  Musculoskeletal:        General: Swelling (+ left arm) present.     Cervical back: Neck supple.  Lymphadenopathy:     Cervical: No cervical adenopathy.     Upper Body:     Right upper body: No axillary adenopathy.     Left upper body: No axillary adenopathy.  Skin:    General: Skin is warm and dry.     Findings: No rash.  Neurological:     General: No focal deficit present.     Mental Status: She is alert.  Psychiatric:        Mood and Affect: Mood normal.        Behavior: Behavior normal.     LABORATORY DATA:  CBC    Component Value Date/Time   WBC 10.0 04/29/2022 1344   RBC 4.39 04/29/2022 1344   HGB 13.0 04/29/2022 1344   HGB 12.3 02/18/2021 1422   HGB 12.3 03/19/2017 1456   HCT 39.6 04/29/2022 1344   HCT 37.5 03/19/2017 1456   PLT 258 04/29/2022 1344   PLT 262 02/18/2021 1422   PLT 283 03/19/2017 1456   MCV 90.2  04/29/2022 1344   MCV 87 03/19/2017 1456   MCH 29.6 04/29/2022 1344   MCHC 32.8 04/29/2022 1344   RDW 11.9 04/29/2022 1344   RDW 13.3 03/19/2017 1456   LYMPHSABS 1.3 02/18/2021 1422   MONOABS 0.6 02/18/2021 1422   EOSABS 0.2 02/18/2021 1422   BASOSABS 0.0 02/18/2021 1422    CMP     Component Value Date/Time   NA 141 07/08/2021 0844   K 4.0 07/08/2021 0844   CL 106 07/08/2021 0844   CO2 27 07/08/2021 0844   GLUCOSE 104 (H) 07/08/2021 0844   BUN 13 07/08/2021 0844   CREATININE 0.90 07/28/2022 1636   CREATININE 0.70 02/18/2021 1422   CREATININE 0.72 10/08/2020 1229   CALCIUM 9.4 07/08/2021 0844   PROT 7.3 07/08/2021 0844   ALBUMIN 4.0 07/08/2021 0844   AST 15 07/08/2021 0844   AST 16 02/18/2021 1422   ALT 17 07/08/2021 0844   ALT 14 02/18/2021 1422   ALKPHOS 60 07/08/2021 0844   BILITOT 0.9 07/08/2021 0844   BILITOT 0.3 02/18/2021 1422   GFRNONAA >60 07/08/2021 0844   GFRNONAA >60 02/18/2021 1422       ASSESSMENT and THERAPY PLAN:   Malignant neoplasm of upper-outer quadrant of left breast in female, estrogen receptor positive (HCC) 11/29/2020: 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 Oncotype DX: Low risk no role of chemo 2. Adjuvant radiation therapy completed 03/03/2021 3. Adjuvant antiestrogen therapy started 03/19/2021 discontinued 05/03/2021 (allergic reaction to Tamoxifen ) -------------------------------------------------------------------------------------------------------------- Breast cancer surveillance: Breast MRI 09/04/2021: Benign  breast density category D Mammogram 10/21/2021: Marked skin thickening left breast with lymphedema benign breast density category C Breast exam 12/04/2021: Benign   Multiple psychomotor symptoms especially brief lapses in focus and  concentration/memory, etc.: Much improved after stopping antiestrogen therapy Severe hot flashes: Much improved after stopping antiestrogen therapy 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 Pain radiating down the arm with a sensation of coolness and burning. No relief with Tylenol  or heat application. -Refer for urgent CT chest w/contrast -1 aleve and 1 extra strength tylenol  TID PRN -Hydrocodone /Acetaminophen  5-325mg  Q6H PRN ordered for pain unrelieved by aleve/tylenol   -Augmentin  BID x 7 days  Lymphedema Resumed use of lymphedema pump due to discomfort and swelling, despite previous discontinuation due to low numbers. -Consider reevaluation by lymphedema specialist to assess need for continued pump use.  RTC after CT chest.     All questions were answered. The patient knows to call the clinic with any problems, questions or concerns. We can certainly see the patient much sooner if necessary.  Total encounter time:30 minutes*in face-to-face visit time, chart review, lab review, care coordination, order entry, and documentation of the encounter time.    Morna Kendall, NP 02/25/23 2:29 PM Medical Oncology and Hematology [facility] [address] [place], [place] [address] Tel. [phone]    Fax. [phone]  *Total Encounter Time as defined by the Centers for Medicare and Medicaid Services includes, in addition to the face-to-face time of a patient visit (documented in the note above) non-face-to-face time: obtaining and reviewing outside history, ordering and reviewing medications, tests or procedures, care coordination (communications with other health care professionals or caregivers) and documentation in the medical record.

## 2023-02-26 ENCOUNTER — Encounter: Payer: Self-pay | Admitting: Adult Health

## 2023-02-26 DIAGNOSIS — Z170 Estrogen receptor positive status [ER+]: Secondary | ICD-10-CM

## 2023-03-01 ENCOUNTER — Other Ambulatory Visit

## 2023-03-01 NOTE — Addendum Note (Signed)
 Addended by: Dayne Even L on: 03/01/2023 11:23 AM   Modules accepted: Orders

## 2023-03-02 NOTE — Therapy (Addendum)
 OUTPATIENT PHYSICAL THERAPY  UPPER EXTREMITY ONCOLOGY EVALUATION  Patient Name: Gabriela Evans MRN: [id number] DOB:March 19, 1980, 43 y.o., female Today's Date: 03/03/2023  END OF SESSION:  PT End of Session - 03/03/23 1053     Visit Number 1    Number of Visits 3    Date for PT Re-Evaluation 03/17/23    PT Start Time 1100    PT Stop Time 1155    PT Time Calculation (min) 55 min    Activity Tolerance Patient tolerated treatment well    Behavior During Therapy WFL for tasks assessed/performed             Past Medical History:  Diagnosis Date   Anemia    Breast cancer (HCC)    Complication of anesthesia    nausea   Gestational diabetes    Kidney infection    Other abnormal glucose    Gestational diabetes with both children   Personal history of radiation therapy    Pneumonia    01-2019   PONV (postoperative nausea and vomiting)    Past Surgical History:  Procedure Laterality Date   APPENDECTOMY     BREAST LUMPECTOMY     BREAST LUMPECTOMY WITH RADIOACTIVE SEED AND SENTINEL LYMPH NODE BIOPSY Left 11/29/2020   Procedure: LEFT BREAST LUMPECTOMY WITH RADIOACTIVE SEED AND SENTINEL LYMPH NODE BIOPSY;  Surgeon: Griselda Miner, MD;  Location:  SUR[facility]ERY CENTER;  Service: General;  Laterality: Left;   CESAREAN SECTION     X2   IUD inserted in OR     LAPAROSCOPIC APPENDECTOMY N/A 07/08/2021   Procedure: APPENDECTOMY LAPAROSCOPIC;  Surgeon: Leafy Ro, MD;  Location: [facility];  Service: General;  Laterality: N/A;   PELVIC LAPAROSCOPY  2009   remove perforated IUD   ROBOTIC ASSISTED LAPAROSCOPIC LYSIS OF ADHESION N/A 04/26/2019   Procedure: XI ROBOTIC ASSISTED LAPAROSCOPIC LYSIS OF ADHESION;  Surgeon: Genia Del, MD;  Location: [facility];  Service: Gynecology;  Laterality: N/A;   ROBOTIC ASSISTED TOTAL HYSTERECTOMY WITH BILATERAL SALPINGO OOPHERECTOMY Bilateral 04/26/2019   Procedure: XI ROBOTIC ASSISTED TOTAL LAPAROSCOPIC HYSTERECTOMY WITH  BILATERAL SALPINGECTOMY;  Surgeon: Genia Del, MD;  Location: [facility];  Service: Gynecology;  Laterality: Bilateral;  requests 8:30 OR start in [place] Gyn block time requests 2 hours   THYROIDECTOMY N/A 05/01/2022   Procedure: TOTAL THYROIDECTOMY, POSSIBLE LEFT THYROID LOBECTOMY;  Surgeon: Darnell Level, MD;  Location: [facility];  Service: General;  Laterality: N/A;   WISDOM TOOTH EXTRACTION     Patient Active Problem List   Diagnosis Date Noted   Neoplasm of uncertain behavior of thyroid gland 04/28/2022   Dysphagia 04/28/2022   Acute appendicitis 07/08/2021   Genetic testing 11/07/2020   Malignant neoplasm of upper-outer quadrant of left breast in female, estrogen receptor positive (HCC) 10/28/2020   Postoperative state 04/26/2019   Menorrhagia 03/21/2018       REFERRING PROVIDER: Serena Croissant, MD  REFERRING DIAG: Lymphedema  THERAPY DIAG:  Malignant neoplasm of upper-outer quadrant of left female breast, unspecified estrogen receptor status (HCC)  Lymphedema, not elsewhere classified  Cervicalgia  Pain in left arm  ONSET DATE: Feb 1 st, 2025  Rationale for Evaluation and Treatment: Rehabilitation  SUBJECTIVE:  SUBJECTIVE STATEMENT: Pain in left scapula shoulder and lateral elbow and  triceps pain. No numbness she has noticed but it is an intense burning pain.  I feel like there is a lot of pressure in my arm and posterior shoulder. Her left neck feels tight. Had to go to UC because her arm was hurting so bad.. Left arm feels weak and she has trouble gripping The PA reclined me and I had so much pain. Putting her arm over head helped. They Xrayed her neck ,shoulder and elbow which were fine.She was placed  on a 6 day prednisone pack that she started last night..  She  was told to return to [facility] in 2 weeks to see the same PA.Breast redness has resolved since Antiobiotics last Thursday. Arm pain also seemed to diminish with the antibiotic. Marland Kitchen  PERTINENT HISTORY:    Lt lumpectomy with 2 negative lymph nodes removed on 11/29/20.  She underwent a partial hysterectomy (ovaries still remain) in 04/2019. Radiation completed. Hx of antibiotics for possible breast infection. Pt has a Flexi touch. See subjective for more recent history of neck PAIN:  Are you having pain? Yes NPRS scale: 2/10 present and best, 9/10 at worst, Pain location:Left scapular region, left UE to lateral elbow. Nothing into hand Pain orientation: Left  PAIN TYPE: aching, burning, sharp, and throbbing, no numbness Pain description: constant  Aggravating factors: lying down, bending over,driving, gripping is weak, Relieving factors: oral steroids started last night,  PRECAUTIONS:   RED FLAGS: None   WEIGHT BEARING RESTRICTIONS: No  FALLS:  Has patient fallen in last 6 months? 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  COGNITION: Overall cognitive status: Within functional limits for tasks assessed   PALPATION:   OBSERVATIONS / OTHER ASSESSMENTS: Left breast with enlarged pores medially and mild fibrosis. Pt wearing compression bra. No redness or tenderness  SENSATION: Light touch: Appears intact   POSTURE: forward head, rounded shoulders  UPPER EXTREMITY AROM/PROM: Left arm raising over head reduces symptoms  A/PROM RIGHT   eval   Shoulder extension   Shoulder flexion   Shoulder abduction   Shoulder internal rotation    Shoulder external rotation     (Blank rows = not tested)  A/PROM LEFT   eval  Shoulder extension   Shoulder flexion   Shoulder abduction   Shoulder internal rotation   Shoulder external rotation     (Blank rows = not tested)  CERVICAL AROM:    Percent limited  Flexion Decreased 90%, increased pain  Extension Decreased 90%  Right lateral flexion Decreased 90% increased throbbing neck, lateral elbow  Left lateral flexion Decreased 50 % increased pain.  Right rotation WFL, no change  Left rotation Decreased 70%, increased pain neck, shoulder blade and arm.    UPPER EXTREMITY STRENGTH:   LYMPHEDEMA ASSESSMENTS:   SURGERY TYPE/DATE: 11/29/2020  NUMBER OF LYMPH NODES REMOVED: 0+/2  CHEMOTHERAPY: No  RADIATION:YES  HORMONE TREATMENT: NO  INFECTIONS: Left breast, now resolved with antibiotics    GAIT: WNL   QUICK DASH SURVEY: 52%  TREATMENT DATE:   03/03/2023 Trial of manual traction with pt supported on pillow with towel folded underneath.  Pain increased slightly until placed in slightly more flexion, then left UE pain decreased and pain was increased at left scapula. (Centralizing) Bringing arm over head also decreased arm pain. Assessed left breast; no redness, mildly fibrotic at medial breast with enlarged pores. Foam pad in TG soft made and placed in her compression bra. She has a Flexi touch that she is using. Discussed option of learning MLD at some point and that it could be done at [facility] since she lives in [place]. Pt prefers to have orthopedic therapy in [place] at [facility] and will pursue her neck treatment there.    PATIENT EDUCATION:  Education details: cervical disc and symptoms of centralizing vs peripheralizing, discussed treatment in [place], caution with posture;avoid forward head, excessive neck flexion Person  educated: Patient Education method: Explanation Education comprehension: verbalized understanding  HOME EXERCISE PROGRAM: None given  ASSESSMENT:  CLINICAL IMPRESSION: Patient is a 43 y.o. female who was seen today for physical therapy evaluation and treatment for complaints of severe left scapular and arm pain. She presents with limitations in all cervical ROM, with increased pain with all except right rotation. Pain centralized to scapular region with sitting retractions, but arm pain returned fairly quickly when she assumed her normal posture. Manual cervical traction with neck slightly flexed also centralized to left scapular region and, bringing arm over head also decreased left arm pain. She has only taken 1 day of a 6 day dosepack. She is from [facility] and prefers to have therapy closer to home. She has had therapy at [facility] in [place] and will pursue that. I also checked her left breast while she was here. All redness is resolved. She does have enlarged pores and mild fibrosis at the medial breast. She was given a foam pad to place in her bra, and she will continue to wear her compression bra and use her Flexi touch pump. We can review MLD with her at another time as her neck pain is most important presently. Likewise she could be seen at [facility] for breast lymphedema when she is ready to. Pt was given my card and asked to contact us when she has her therapy arranged elsewhere. No visits were set up here, but dates were extended if she found out she could not get in quickly elsewhere.   OBJECTIVE IMPAIRMENTS: decreased activity tolerance, decreased knowledge of condition, decreased ROM, decreased strength, increased edema, impaired UE functional use, postural dysfunction, and pain.   ACTIVITY LIMITATIONS: carrying, lifting, bending, sleeping, and bed mobility  PARTICIPATION LIMITATIONS: cleaning, laundry, driving, and occupation  PERSONAL FACTORS: 1-2 comorbidities: Left breast cancer  s/p radiation  are also affecting patient's functional outcome.   REHAB POTENTIAL: Good  CLINICAL DECISION MAKING: Evolving/moderate complexity  EVALUATION COMPLEXITY: Moderate  GOALS: Goals reviewed with patient? Yes  SHORT TERM GOALS=LONG TERM GOALS: Target date: 03/17/2023  Pt will be independent with a HEP Baseline: Goal status: INITIAL  2.  Pt will have left UE symptoms decreased by 50%  Baseline:  Goal status: INITIAL  3.  Pt will be able to drive with minimal discomfort Baseline:  Goal status: INITIAL  4.  Pt will have sleep improved atleast 50% Baseline:  Goal status: INITIAL  5.  Left UE grip strength will be within 5-10 lbs of right Baseline:  Goal status: INITIAL     PLAN:  PT FREQUENCY: No visits with [place] set up  at this time. She prefers to have therapy closer to [facility] as it is a 50 min drive to get here  PT DURATION: 2 weeks, only if pt unable to get appts closer to home  PLANNED INTERVENTIONS: manual therapy, therapeutic exercises, self care, Dry needling, Moist heat, Cryotherapy,Traction, Electrical Stimulation  PLAN FOR NEXT SESSION: no visits set up.Pt will let me know if we can discharge. PHYSICAL THERAPY DISCHARGE SUMMARY  Visits from Start of Care: 1  Current functional level related to goals / functional outcomes: See evaluation   Remaining deficits: Unknown;pt did not return. She preferred to have therapy on her neck at [facility] in [place] as she lives in [place]   Education / Equipment: Educated that the pain she is experiencing is from her neck not lymphedema   Patient goals were not met. Patient is being discharged due to not returning since the last visit.Pt opted for orthopedic therapy closer to home.   Waynette Buttery, PT 03/03/2023, 12:27 PM

## 2023-03-03 ENCOUNTER — Other Ambulatory Visit: Payer: Self-pay

## 2023-03-03 ENCOUNTER — Ambulatory Visit: Attending: Hematology and Oncology

## 2023-03-03 DIAGNOSIS — Z170 Estrogen receptor positive status [ER+]: Secondary | ICD-10-CM | POA: Diagnosis not present

## 2023-03-03 DIAGNOSIS — C50412 Malignant neoplasm of upper-outer quadrant of left female breast: Secondary | ICD-10-CM

## 2023-03-03 DIAGNOSIS — I890 Lymphedema, not elsewhere classified: Secondary | ICD-10-CM | POA: Diagnosis present

## 2023-03-03 DIAGNOSIS — M79602 Pain in left arm: Secondary | ICD-10-CM

## 2023-03-03 DIAGNOSIS — M542 Cervicalgia: Secondary | ICD-10-CM

## 2023-03-15 NOTE — Telephone Encounter (Signed)
 Its hard to say it is definitely related to radiation.  I am glad you got some answers.  I am happy to talk about this further with a virtual appointment if needed.  Thanks, LC

## 2023-04-14 ENCOUNTER — Encounter: Payer: Self-pay | Admitting: Hematology and Oncology

## 2023-04-20 ENCOUNTER — Ambulatory Visit
Admission: RE | Admit: 2023-04-20 | Discharge: 2023-04-20 | Disposition: A | Discharge status: Home / Self Care | Source: Ambulatory Visit | Attending: Hematology and Oncology

## 2023-04-20 DIAGNOSIS — Z170 Estrogen receptor positive status [ER+]: Secondary | ICD-10-CM

## 2023-04-20 MED ORDER — GADOPICLENOL 0.5 MMOL/ML IV SOLN
9.0000 mL | Freq: Once | INTRAVENOUS | Status: AC | PRN
  Administered 2023-04-20: 9 mL via INTRAVENOUS

## 2023-04-26 ENCOUNTER — Other Ambulatory Visit: Payer: Self-pay

## 2023-04-26 ENCOUNTER — Other Ambulatory Visit: Payer: Self-pay | Admitting: Hematology and Oncology

## 2023-04-26 ENCOUNTER — Encounter: Payer: Self-pay | Admitting: Rehabilitation

## 2023-04-26 ENCOUNTER — Encounter: Payer: Self-pay | Admitting: Hematology and Oncology

## 2023-04-26 DIAGNOSIS — Z170 Estrogen receptor positive status [ER+]: Secondary | ICD-10-CM

## 2023-04-28 NOTE — Assessment & Plan Note (Signed)
 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 Oncotype DX: Low risk no role of chemo 2. Adjuvant radiation therapy completed 03/03/2021 3. Adjuvant antiestrogen therapy started 03/19/2021 discontinued 05/03/2021 (allergic reaction to Tamoxifen) -------------------------------------------------------------------------------------------------------------- Breast cancer surveillance: Breast MRI 04/20/2023: Benign breast density category D Mammogram 10/21/2021: Marked skin thickening left breast with lymphedema benign breast density category C Breast exam 12/04/2021: Benign CT chest 02/25/2023: Benign   Multiple psychomotor symptoms especially brief lapses in focus and concentration/memory, etc.: Much improved after stopping antiestrogen therapy Severe hot flashes: Much improved after stopping antiestrogen therapy  Papillary thyroid carcinoma: 05/01/2022 left thyroid lobectomy: 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

## 2023-04-29 ENCOUNTER — Encounter: Payer: Self-pay | Admitting: Rehabilitation

## 2023-04-29 ENCOUNTER — Inpatient Hospital Stay: Attending: Adult Health | Admitting: Hematology and Oncology

## 2023-04-29 ENCOUNTER — Ambulatory Visit: Attending: Hematology and Oncology | Admitting: Rehabilitation

## 2023-04-29 ENCOUNTER — Inpatient Hospital Stay

## 2023-04-29 VITALS — BP 129/91 | HR 71 | Temp 97.8°F | Resp 18 | Ht 69.0 in | Wt 214.9 lb

## 2023-04-29 DIAGNOSIS — Z85850 Personal history of malignant neoplasm of thyroid: Secondary | ICD-10-CM | POA: Insufficient documentation

## 2023-04-29 DIAGNOSIS — Z1721 Progesterone receptor positive status: Secondary | ICD-10-CM | POA: Diagnosis not present

## 2023-04-29 DIAGNOSIS — Z79899 Other long term (current) drug therapy: Secondary | ICD-10-CM | POA: Insufficient documentation

## 2023-04-29 DIAGNOSIS — C50412 Malignant neoplasm of upper-outer quadrant of left female breast: Secondary | ICD-10-CM | POA: Insufficient documentation

## 2023-04-29 DIAGNOSIS — Z170 Estrogen receptor positive status [ER+]: Secondary | ICD-10-CM

## 2023-04-29 DIAGNOSIS — I890 Lymphedema, not elsewhere classified: Secondary | ICD-10-CM | POA: Diagnosis not present

## 2023-04-29 DIAGNOSIS — Z1732 Human epidermal growth factor receptor 2 negative status: Secondary | ICD-10-CM | POA: Diagnosis not present

## 2023-04-29 DIAGNOSIS — Z483 Aftercare following surgery for neoplasm: Secondary | ICD-10-CM | POA: Insufficient documentation

## 2023-04-29 DIAGNOSIS — Z923 Personal history of irradiation: Secondary | ICD-10-CM | POA: Insufficient documentation

## 2023-04-29 LAB — CBC WITH DIFFERENTIAL (CANCER CENTER ONLY)
Abs Immature Granulocytes: 0.01 10*3/uL (ref 0.00–0.07)
Basophils Absolute: 0 10*3/uL (ref 0.0–0.1)
Basophils Relative: 0 %
Eosinophils Absolute: 0.1 10*3/uL (ref 0.0–0.5)
Eosinophils Relative: 2 %
HCT: 40 % (ref 36.0–46.0)
Hemoglobin: 13.9 g/dL (ref 12.0–15.0)
Immature Granulocytes: 0 %
Lymphocytes Relative: 30 %
Lymphs Abs: 2 10*3/uL (ref 0.7–4.0)
MCH: 30.6 pg (ref 26.0–34.0)
MCHC: 34.8 g/dL (ref 30.0–36.0)
MCV: 88.1 fL (ref 80.0–100.0)
Monocytes Absolute: 0.6 10*3/uL (ref 0.1–1.0)
Monocytes Relative: 8 %
Neutro Abs: 4.1 10*3/uL (ref 1.7–7.7)
Neutrophils Relative %: 60 %
Platelet Count: 266 10*3/uL (ref 150–400)
RBC: 4.54 MIL/uL (ref 3.87–5.11)
RDW: 12 % (ref 11.5–15.5)
WBC Count: 6.8 10*3/uL (ref 4.0–10.5)
nRBC: 0 % (ref 0.0–0.2)

## 2023-04-29 LAB — CMP (CANCER CENTER ONLY)
ALT: 14 U/L (ref 0–44)
AST: 13 U/L — ABNORMAL LOW (ref 15–41)
Albumin: 4.6 g/dL (ref 3.5–5.0)
Alkaline Phosphatase: 46 U/L (ref 38–126)
Anion gap: 5 (ref 5–15)
BUN: 13 mg/dL (ref 6–20)
CO2: 28 mmol/L (ref 22–32)
Calcium: 9.5 mg/dL (ref 8.9–10.3)
Chloride: 106 mmol/L (ref 98–111)
Creatinine: 0.75 mg/dL (ref 0.44–1.00)
GFR, Estimated: >60 mL/min (ref >60)
Glucose, Bld: 83 mg/dL (ref 70–99)
Potassium: 3.9 mmol/L (ref 3.5–5.1)
Sodium: 139 mmol/L (ref 135–145)
Total Bilirubin: 0.6 mg/dL (ref 0.0–1.2)
Total Protein: 7.5 g/dL (ref 6.5–8.1)

## 2023-04-29 NOTE — Progress Notes (Signed)
 Patient Care Team: Patient, No Pcp Per as PCP - General (General Practice) Griselda Miner, MD as Consulting Physician (General Surgery) Serena Croissant, MD as Consulting Physician (Hematology and Oncology) Dorothy Puffer, MD as Consulting Physician (Radiation Oncology)  DIAGNOSIS:  Encounter Diagnosis  Name Primary?   Malignant neoplasm of upper-outer quadrant of left breast in female, estrogen receptor positive (HCC) Yes    SUMMARY OF ONCOLOGIC HISTORY: Oncology History  Malignant neoplasm of upper-outer quadrant of left breast in female, estrogen receptor positive (HCC)  10/28/2020 Initial Diagnosis   Presented with new focal thickening and pain her outer left breast. Diagnostic mammogram and [place]: indeterminate mass in the left breast at 3:00. 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  Breast MRI: 1 prominent lymph node and 9 mm mass     11/06/2020 Genetic Testing   Negative hereditary cancer genetic testing: no pathogenic variants detected in [facility] BRCAPlus Panel and [facility] CancerNext-Expanded +RNAinsight Panel. The report dates are November 06, 2020 and November 10, 2020, respectively.   The BRCAplus panel offered by [employer] and includes sequencing and deletion/duplication analysis for the following 8 genes: ATM, BRCA1, BRCA2, CDH1, CHEK2, PALB2, PTEN, and TP53.  The CancerNext-Expanded gene panel offered by [facility] and includes sequencing, rearrangement, and RNA analysis for the following 77 genes: AIP, ALK, APC, ATM, AXIN2, BAP1, BARD1, BLM, BMPR1A, BRCA1, BRCA2, BRIP1, CDC73, CDH1, CDK4, CDKN1B, CDKN2A, CHEK2, CTNNA1, DICER1, FANCC, FH, FLCN, GALNT12, KIF1B, LZTR1, MAX, MEN1, MET, MLH1, MSH2, MSH3, MSH6, MUTYH, NBN, NF1, NF2, NTHL1, PALB2, PHOX2B, PMS2, POT1, PRKAR1A, PTCH1, PTEN, RAD51C, RAD51D, RB1, RECQL, RET, SDHA, SDHAF2, SDHB, SDHC, SDHD, SMAD4, SMARCA4, SMARCB1, SMARCE1, STK11, SUFU, TMEM127, TP53, TSC1,  TSC2, VHL and XRCC2 (sequencing and deletion/duplication); EGFR, EGLN1, HOXB13, KIT, MITF, PDGFRA, POLD1, and POLE (sequencing only); EPCAM and GREM1 (deletion/duplication only).    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, margins negative, ER 95%, PR 100%, HER2 negative, Ki-67 10%    12/11/2020 Oncotype testing   Oncotype score: 20, distant recurrence at 9 years: 6%   01/14/2021 - 03/03/2021 Radiation Therapy     Site Technique Total Dose (Gy) Dose per Fx (Gy) Completed Fx Beam Energies  Breast, Left: Breast_L 3D 50.4/50.4 1.8 28/28 6X  Breast, Left: Breast_L_Bst 3D 10/10 2 5/5 6X      03/2021 - 04/2021 Anti-estrogen oral therapy   Tamoxifen daily--unable to tolerate     CHIEF COMPLIANT: Surveillance of breast cancer  HISTORY OF PRESENT ILLNESS:   History of Present Illness The patient, with a history of thyroid cancer and hormone therapy, presents with neck pain and numbness due to herniated discs and impinged nerves. She has been in physical therapy and has had an injection for the pain. She has also been seeing a neurologist and has requested to be screened for MS due to bladder issues and foot pain. The foot pain has been present since her thyroid surgery last year.  The patient also reports difficulty swallowing and respiratory issues. She has seen improvement since stopping hormone therapy. She has also been dealing with a significant amount of stress at home, including homeschooling her youngest child and dealing with family dynamics.  The patient has also experienced a recent episode of breast redness and hardness, which was painful enough to prevent her from lying down. This has since improved.     ALLERGIES:  is allergic to nickel, gluten meal, milk-related  compounds, other, tramadol, oxycodone-acetaminophen, and tamoxifen.  MEDICATIONS:  Current Outpatient Medications  Medication Sig Dispense Refill    Charcoal Activated (ACTIVATED CHARCOAL) POWD Take 1 packet by mouth as needed (gi upset).     ciclopirox (PENLAC) 8 % solution Apply topically daily.     levothyroxine (SYNTHROID) 50 MCG tablet Take 50 mcg by mouth daily.     Multiple Vitamins-Minerals (MULTIVITAMIN WITH MINERALS) tablet Take 3 tablets by mouth daily.     Probiotic Product (PROBIOTIC PO) Take 1 tablet by mouth daily.     HYDROcodone-acetaminophen (NORCO/VICODIN) 5-325 MG tablet Take 1 tablet by mouth every 6 (six) hours as needed for moderate pain (pain score 4-6). (Patient not taking: Reported on 04/29/2023) 20 tablet 0   hydrocortisone cream 1 % Apply 1 Application topically as needed for itching. (Patient not taking: Reported on 04/29/2023)     ibuprofen (ADVIL) 200 MG tablet Take 200 mg by mouth as needed for moderate pain. (Patient not taking: Reported on 04/29/2023)     triamcinolone cream (KENALOG) 0.1 % Apply 1 Application topically 2 (two) times daily. (Patient not taking: Reported on 04/29/2023)     No current facility-administered medications for this visit.    PHYSICAL EXAMINATION: ECOG PERFORMANCE STATUS: 1 - Symptomatic but completely ambulatory  Vitals:   04/29/23 0922  BP: (!) 129/91  Pulse: 71  Resp: 18  Temp: 97.8 F (36.6 C)  SpO2: 100%   Filed Weights   04/29/23 0922  Weight: 214 lb 14.4 oz (97.5 kg)      LABORATORY DATA:  I have reviewed the data as listed    Latest Ref Rng & Units 07/28/2022    4:36 PM 07/08/2021    8:44 AM 02/18/2021    2:22 PM  CMP  Glucose 70 - 99 mg/dL  756  89   BUN 6 - 20 mg/dL  13  10   Creatinine 4.33 - 1.00 mg/dL 2.95  1.88  4.16   Sodium 135 - 145 mmol/L  141  139   Potassium 3.5 - 5.1 mmol/L  4.0  3.6   Chloride 98 - 111 mmol/L  106  105   CO2 22 - 32 mmol/L  27  28   Calcium 8.9 - 10.3 mg/dL  9.4  9.4   Total Protein 6.5 - 8.1 g/dL  7.3  7.2   Total Bilirubin 0.3 - 1.2 mg/dL  0.9  0.3   Alkaline Phos 38 - 126 U/L  60  47   AST 15 - 41 U/L  15  16   ALT 0 -  44 U/L  17  14     Lab Results  Component Value Date   WBC 6.8 04/29/2023   HGB 13.9 04/29/2023   HCT 40.0 04/29/2023   MCV 88.1 04/29/2023   PLT 266 04/29/2023   NEUTROABS 4.1 04/29/2023    ASSESSMENT & PLAN:  Malignant neoplasm of upper-outer quadrant of left breast in female, estrogen receptor positive (HCC) 11/29/2020: 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 Oncotype DX: Low risk no role of chemo 2. Adjuvant radiation therapy completed 03/03/2021 3. Adjuvant antiestrogen therapy started 03/19/2021 discontinued 05/03/2021 (allergic reaction to Tamoxifen) -------------------------------------------------------------------------------------------------------------- Breast cancer surveillance: Breast MRI 04/20/2023: Benign breast density category D Mammogram 10/21/2021: Marked skin thickening left breast with lymphedema benign breast  density category C Breast exam 12/04/2021: Benign CT chest 02/25/2023: Benign   Multiple psychomotor symptoms especially brief lapses in focus and concentration/memory, etc.: Much improved after stopping antiestrogen therapy Severe hot flashes: Much improved after stopping antiestrogen therapy  Papillary thyroid carcinoma: 05/01/2022 left thyroid lobectomy: 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  Planning to put her in regular school next year. Recommended circulating tumor DNA testing to evaluate for MRD.  ------------------------------------- Assessment and Plan Assessment & Plan Cervical radiculopathy Herniated cervical discs with nerve impingement causing pain and numbness. Neurology evaluation and MS screening  considered due to bladder issues and foot pain. - Continue physical therapy. - Follow up with neurology for evaluation and potential MS screening. - Consider brain MRI if recommended by neurology.     No orders of the defined types were placed in this encounter.  The patient has a good understanding of the overall plan. she agrees with it. she will call with any problems that may develop before the next visit here. Total time spent: 30 mins including face to face time and time spent for planning, charting and co-ordination of care   Tamsen Meek, MD 04/29/23

## 2023-04-29 NOTE — Therapy (Signed)
 OUTPATIENT PHYSICAL THERAPY SOZO SCREENING NOTE   Patient Name: Gabriela Evans MRN: [id number] DOB:Aug 16, 1980, 43 y.o., female Today's Date: 04/29/2023  PCP: Patient, No Pcp Per REFERRING PROVIDER: Serena Croissant, MD   PT End of Session - 04/29/23 1157     Visit Number 1   screen   PT Start Time 1045    PT Stop Time 1050    PT Time Calculation (min) 5 min    Activity Tolerance Patient tolerated treatment well    Behavior During Therapy WFL for tasks assessed/performed             Past Medical History:  Diagnosis Date   Anemia    Breast cancer (HCC)    Complication of anesthesia    nausea   Gestational diabetes    Kidney infection    Other abnormal glucose    Gestational diabetes with both children   Personal history of radiation therapy    Pneumonia    01-2019   PONV (postoperative nausea and vomiting)    Past Surgical History:  Procedure Laterality Date   APPENDECTOMY     BREAST LUMPECTOMY     BREAST LUMPECTOMY WITH RADIOACTIVE SEED AND SENTINEL LYMPH NODE BIOPSY Left 11/29/2020   Procedure: LEFT BREAST LUMPECTOMY WITH RADIOACTIVE SEED AND SENTINEL LYMPH NODE BIOPSY;  Surgeon: Griselda Miner, MD;  Location: [facility];  Service: General;  Laterality: Left;   CESAREAN SECTION     X2   IUD inserted in OR     LAPAROSCOPIC APPENDECTOMY N/A 07/08/2021   Procedure: APPENDECTOMY LAPAROSCOPIC;  Surgeon: Leafy Ro, MD;  Location: [facility];  Service: General;  Laterality: N/A;   PELVIC LAPAROSCOPY  2009   remove perforated IUD   ROBOTIC ASSISTED LAPAROSCOPIC LYSIS OF ADHESION N/A 04/26/2019   Procedure: XI ROBOTIC ASSISTED LAPAROSCOPIC LYSIS OF ADHESION;  Surgeon: Genia Del, MD;  Location: [facility];  Service: Gynecology;  Laterality: N/A;   ROBOTIC ASSISTED TOTAL HYSTERECTOMY WITH BILATERAL SALPINGO OOPHERECTOMY Bilateral 04/26/2019   Procedure: XI ROBOTIC ASSISTED TOTAL LAPAROSCOPIC HYSTERECTOMY WITH BILATERAL  SALPINGECTOMY;  Surgeon: Genia Del, MD;  Location: [facility];  Service: Gynecology;  Laterality: Bilateral;  requests 8:30 OR start in [place] Gyn block time requests 2 hours   THYROIDECTOMY N/A 05/01/2022   Procedure: TOTAL THYROIDECTOMY, POSSIBLE LEFT THYROID LOBECTOMY;  Surgeon: Darnell Level, MD;  Location: [facility];  Service: General;  Laterality: N/A;   WISDOM TOOTH EXTRACTION     Patient Active Problem List   Diagnosis Date Noted   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  She underwent a partial hysterectomy (ovaries still remain) in 04/2019. Radiation completed. Hx of antibiotics for possible breast infection   PRECAUTIONS: left UE Lymphedema risk, None  SUBJECTIVE: Pt returns for her last 3 month L-Dex screen.   PAIN:  Are you having pain? No  SOZO SCREENING: Patient was assessed today using the SOZO machine to determine the lymphedema index score. This  was compared to her baseline score. It was determined that she is within the recommended range when compared to her baseline and no further action is needed at this time. She will continue SOZO screenings. These are done every 3 months for 2 years post operatively followed by every 6 months for 2 years, and then annually.   L-DEX FLOWSHEETS - 04/29/23 1100       L-DEX LYMPHEDEMA SCREENING   Measurement Type Unilateral     L-DEX MEASUREMENT EXTREMITY Upper Extremity    POSITION  Standing    DOMINANT SIDE Right    At Risk Side Left    BASELINE SCORE (UNILATERAL) -3.4    L-DEX SCORE (UNILATERAL) -3.4    VALUE CHANGE (UNILAT) 0             Begin 6 month L-Dex screens next.   Berna Spare, PTA 04/29/23 12:00 PM

## 2023-04-30 ENCOUNTER — Telehealth: Payer: Self-pay

## 2023-04-30 LAB — FOLLICLE STIMULATING HORMONE: FSH: 6 m[IU]/mL

## 2023-04-30 LAB — LUTEINIZING HORMONE: LH: 7.2 m[IU]/mL

## 2023-04-30 NOTE — Telephone Encounter (Signed)
 Per md orders entered for Guardant Reveal and all supported documents faxed to [phone]. Faxed confirmation was received.

## 2023-05-07 LAB — ESTRADIOL, ULTRA SENS: Estradiol, Sensitive: 99.9 pg/mL

## 2023-05-26 ENCOUNTER — Telehealth: Payer: Self-pay

## 2023-05-26 NOTE — Telephone Encounter (Signed)
 Called pt per MD to advise Gurdiant testing was negative/not detected. Pt verbalized understanding and knows guardant will be out in the next 6 months to repeat labs.

## 2023-05-27 ENCOUNTER — Encounter: Payer: Self-pay | Admitting: Hematology and Oncology

## 2023-07-03 ENCOUNTER — Encounter: Payer: Self-pay | Admitting: Emergency Medicine

## 2023-07-03 ENCOUNTER — Ambulatory Visit
Admission: EM | Admit: 2023-07-03 | Discharge: 2023-07-03 | Disposition: A | Discharge status: Home / Self Care | Attending: Family Medicine | Admitting: Family Medicine

## 2023-07-03 DIAGNOSIS — L03114 Cellulitis of left upper limb: Secondary | ICD-10-CM | POA: Diagnosis not present

## 2023-07-03 MED ORDER — DOXYCYCLINE HYCLATE 100 MG PO CAPS: 100.0000 mg | ORAL_CAPSULE | Freq: Two times a day (BID) | ORAL | 0 refills | Status: DC

## 2023-07-03 NOTE — ED Triage Notes (Signed)
 Patient reports left shoulder pain and left elbow pain that started a week ago.  Patient reports redness and warmth in her left upper arm that started on Thursday.  Patient states that she does have herniated discs in her cervical spine.

## 2023-07-03 NOTE — ED Provider Notes (Addendum)
 [facility]    CSN: [id number] Arrival date & time: 07/03/23  1531      History   Chief Complaint Chief Complaint  Patient presents with   Cellulitis   Arm Pain    HPI Gabriela Evans is a 43 y.o. female resents for skin infection.  Patient reports 3 to 4 days ago she noticed some redness and warmth to the lateral aspect of her left upper arm.  Denies any known injury or bug bite /scratch.  No fevers or chills.  She is currently keeping the arm in a sling secondary to herniated cervical disks causing pain that radiates into her shoulder and arm.  She is undergoing treatment with this and is currently on steroids and has been getting injections in her neck.  She also has a history of left-sided breast cancer.  No history of MRSA.  Denies pregnancy or breast-feeding.  No other concerns at this time.   Arm Pain    Past Medical History:  Diagnosis Date   Anemia    Breast cancer (HCC)    Complication of anesthesia    nausea   Gestational diabetes    Kidney infection    Other abnormal glucose    Gestational diabetes with both children   Personal history of radiation therapy    Pneumonia    01-2019   PONV (postoperative nausea and vomiting)     Patient Active Problem List   Diagnosis Date Noted   Neoplasm of uncertain behavior of thyroid  gland 04/28/2022   Dysphagia 04/28/2022   Acute appendicitis 07/08/2021   Genetic testing 11/07/2020   Malignant neoplasm of upper-outer quadrant of left breast in female, estrogen receptor positive (HCC) 10/28/2020   Postoperative state 04/26/2019   Menorrhagia 03/21/2018    Past Surgical History:  Procedure Laterality Date   APPENDECTOMY     BREAST LUMPECTOMY     BREAST LUMPECTOMY WITH RADIOACTIVE SEED AND SENTINEL LYMPH NODE BIOPSY Left 11/29/2020   Procedure: LEFT BREAST LUMPECTOMY WITH RADIOACTIVE SEED AND SENTINEL LYMPH NODE BIOPSY;  Surgeon: Caralyn Chandler, MD;  Location: [facility];  Service:  General;  Laterality: Left;   CESAREAN SECTION     X2   IUD inserted in OR     LAPAROSCOPIC APPENDECTOMY N/A 07/08/2021   Procedure: APPENDECTOMY LAPAROSCOPIC;  Surgeon: Alben Alma, MD;  Location: [facility];  Service: General;  Laterality: N/A;   PELVIC LAPAROSCOPY  2009   remove perforated IUD   ROBOTIC ASSISTED LAPAROSCOPIC LYSIS OF ADHESION N/A 04/26/2019   Procedure: XI ROBOTIC ASSISTED LAPAROSCOPIC LYSIS OF ADHESION;  Surgeon: Lavoie, Marie-Lyne, MD;  Location: [facility];  Service: Gynecology;  Laterality: N/A;   ROBOTIC ASSISTED TOTAL HYSTERECTOMY WITH BILATERAL SALPINGO OOPHERECTOMY Bilateral 04/26/2019   Procedure: XI ROBOTIC ASSISTED TOTAL LAPAROSCOPIC HYSTERECTOMY WITH BILATERAL SALPINGECTOMY;  Surgeon: Lavoie, Marie-Lyne, MD;  Location: [facility] [place];  Service: Gynecology;  Laterality: Bilateral;  requests 8:30 OR start in [place] Gyn block time requests 2 hours   THYROIDECTOMY N/A 05/01/2022   Procedure: TOTAL THYROIDECTOMY, POSSIBLE LEFT THYROID  LOBECTOMY;  Surgeon: Oralee Billow, MD;  Location: [facility];  Service: General;  Laterality: N/A;   WISDOM TOOTH EXTRACTION      OB History     Gravida  2   Para  2   Term  1   Preterm      AB  0   Living  2      SAB  IAB      Ectopic  0   Multiple      Live Births               Home Medications    Prior to Admission medications   Medication Sig Start Date End Date Taking? Authorizing Provider  doxycycline (VIBRAMYCIN) 100 MG capsule Take 1 capsule (100 mg total) by mouth 2 (two) times daily. 07/03/23  Yes Brahm Barbeau, Jodi R, NP  levothyroxine (SYNTHROID) 50 MCG tablet Take 50 mcg by mouth daily.   Yes [provider]  methylPREDNISolone  (MEDROL  DOSEPAK) 4 MG TBPK tablet Take 4 mg by mouth. 06/30/23  Yes [provider]  Charcoal Activated (ACTIVATED CHARCOAL) POWD Take 1 packet by mouth as needed (gi upset).    [provider]  ciclopirox (PENLAC) 8  % solution Apply topically daily. 10/22/22   [provider]  HYDROcodone -acetaminophen  (NORCO/VICODIN) 5-325 MG tablet Take 1 tablet by mouth every 6 (six) hours as needed for moderate pain (pain score 4-6). Patient not taking: Reported on 04/29/2023 02/25/23   Causey, Lindsey Cornetto, NP  hydrocortisone cream 1 % Apply 1 Application topically as needed for itching. Patient not taking: Reported on 04/29/2023    [provider]  ibuprofen  (ADVIL ) 200 MG tablet Take 200 mg by mouth as needed for moderate pain. Patient not taking: Reported on 04/29/2023    [provider]  Multiple Vitamins-Minerals (MULTIVITAMIN WITH MINERALS) tablet Take 3 tablets by mouth daily.    [provider]  Probiotic Product (PROBIOTIC PO) Take 1 tablet by mouth daily.    [provider]  triamcinolone cream (KENALOG) 0.1 % Apply 1 Application topically 2 (two) times daily. Patient not taking: Reported on 04/29/2023 10/07/22   [provider]  ferrous sulfate 325 (65 FE) MG tablet Take 325 mg by mouth daily with breakfast.  10/16/19  [provider]    Family History Family History  Problem Relation Age of Onset   Diabetes Mother    Thyroid  disease Father    Diabetes Father    Thyroid  disease Sister    Thyroid  cancer Maternal Grandmother        follicular   Prostate cancer Maternal Grandfather        dx 58s   Melanoma Paternal Grandmother        mets; dx 66s   Thyroid  cancer Maternal Aunt        papillary; dx 26s   Lung cancer Maternal Aunt    Brain cancer Maternal Uncle 2       gliolblastoma   Rectal cancer Paternal Uncle        mets; dx 60s   Cancer Paternal Uncle        unknown type; dx 60s   Esophageal cancer Neg Hx    Pancreatic cancer Neg Hx     Social History Social History   Tobacco Use   Smoking status: Never   Smokeless tobacco: Never  Vaping Use   Vaping status: Never Used  Substance Use Topics   Alcohol use: Yes    Comment:  occas   Drug use: No     Allergies   Nickel, Gluten meal, Milk-related compounds, Other, Tramadol , Oxycodone -acetaminophen , and Tamoxifen    Review of Systems Review of Systems  Skin:        Skin infection     Physical Exam Triage Vital Signs ED Triage Vitals  Encounter Vitals Group     BP 07/03/23 1608 (!) 155/92  Girls Systolic BP Percentile --      Girls Diastolic BP Percentile --      Boys Systolic BP Percentile --      Boys Diastolic BP Percentile --      Pulse Rate 07/03/23 1608 67     Resp 07/03/23 1608 14     Temp 07/03/23 1608 98.3 F (36.8 C)     Temp Source 07/03/23 1608 Oral     SpO2 07/03/23 1608 99 %     Weight 07/03/23 1607 214 lb 15.2 oz (97.5 kg)     Height 07/03/23 1607 5' 9 (1.753 m)     Head Circumference --      Peak Flow --      Pain Score 07/03/23 1607 5     Pain Loc --      Pain Education --      Exclude from Growth Chart --    No data found.  Updated Vital Signs BP (!) 155/92 (BP Location: Right Arm)   Pulse 67   Temp 98.3 F (36.8 C) (Oral)   Resp 14   Ht 5' 9 (1.753 m)   Wt 214 lb 15.2 oz (97.5 kg)   LMP 04/18/2019   SpO2 99%   BMI 31.74 kg/m   Visual Acuity Right Eye Distance:   Left Eye Distance:   Bilateral Distance:    Right Eye Near:   Left Eye Near:    Bilateral Near:     Physical Exam Vitals and nursing note reviewed.  Constitutional:      General: She is not in acute distress.    Appearance: Normal appearance. She is not ill-appearing.  HENT:     Head: Normocephalic and atraumatic.   Eyes:     Pupils: Pupils are equal, round, and reactive to light.    Cardiovascular:     Rate and Rhythm: Normal rate.  Pulmonary:     Effort: Pulmonary effort is normal.   Skin:    General: Skin is warm and dry.         Comments: There is some mild erythema and warmth without induration or fluctuance to the left distal aspect of the upper arm.  Skin is intact without obvious abrasions.  Redness and warmth does not  extend to elbow joint.   Neurological:     General: No focal deficit present.     Mental Status: She is alert and oriented to person, place, and time.   Psychiatric:        Mood and Affect: Mood normal.        Behavior: Behavior normal.      UC Treatments / Results  Labs (all labs ordered are listed, but only abnormal results are displayed) Labs Reviewed - No data to display CMP [facility] only) Order: [id number]  Status: Final result     Next appt: 10/25/2023 at 09:30 AM in Rehabilitation Martell Skinner, Hodges Lunch, PTA)     Dx: Malignant neoplasm of upper-outer qua...   Test Result Released: Yes (seen)   0 Result Notes         Component Ref Range & Units (hover) 2 mo ago (04/29/23) 11 mo ago (07/28/22) 1 yr ago (07/08/21) 2 yr ago (02/18/21) 2 yr ago (10/30/20) 2 yr ago (10/08/20)  Sodium 139  141 139 142 139 R  Potassium 3.9  4.0 3.6 4.0 3.8 R  Chloride 106  106 105 110 105 R  CO2 28  27 28 25 25  R  Glucose,  Bld 83  104 High  CM 89 CM 95 CM 116 High  R, CM  Comment: Glucose reference range applies only to samples taken after fasting for at least 8 hours.  BUN 13  13 10 12 14  R  Creatinine 0.75 0.90 0.64 0.70 0.67 0.72 R  Calcium 9.5  9.4 9.4 9.8 9.5 R  Total Protein 7.5  7.3 7.2 7.6 6.8 R  Albumin 4.6  4.0 4.4 4.2   AST 13 Low   15 16 17 16  R  ALT 14  17 14 19 14  R  Alkaline Phosphatase 46  60 47 46   Total Bilirubin 0.6  0.9 R 0.3 R 0.4 R 0.3 R  GFR, Estimated >60   >60 CM >60 CM   Comment: (NOTE) Calculated using the CKD-EPI Creatinine Equation (2021)  Anion gap 5  8 CM 6 CM 7 CM   Comment: Performed at [facility], [address], [place], [place] [address]  Resulting Agency [facility] [place]        Specimen Collected: 04/29/23 08:58 Last Resulted: 04/29/23 09:48   EKG   Radiology No results found.  Procedures Procedures (including critical care  time)  Medications Ordered in UC Medications - No data to display  Initial Impression / Assessment and Plan / UC Course  I have reviewed the triage vital signs and the nursing notes.  Pertinent labs & imaging results that were available during my care of the patient were reviewed by me and considered in my medical decision making (see chart for details).     Reviewed exam and symptoms with patient.  She is afebrile in clinic and well-appearing.  Does appear to be developing cellulitis to the left upper arm.  Discussed with her likely from her sling rubbing against her arm causing micro abrasions and as she is on steroids this is placing her at higher risk for infection.  Will start doxycycline twice daily.  She is already used a skin marker to mark the area of redness and was advised if redness extends beyond the marked area and/or she develops fevers chills she is to go to the emergency room and seek evaluation.  She verbalized understanding.  She should follow-up with her PCP in 2 to 3 days for recheck. Final Clinical Impressions(s) / UC Diagnoses   Final diagnoses:  Cellulitis of left arm     Discharge Instructions      Start doxycycline twice daily for 10 days.  Monitor the area of infection closely and if you notice it is continuing to expand/worsen and/or you develop fevers or chills please seek reevaluation in the emergency room.  Follow-up with your PCP in 2 to 3 days for recheck.  Hope you feel better soon!    ED Prescriptions     Medication Sig Dispense Auth. Provider   doxycycline (VIBRAMYCIN) 100 MG capsule Take 1 capsule (100 mg total) by mouth 2 (two) times daily. 20 capsule Esley Brooking, Jodi R, NP      PDMP not reviewed this encounter.   Alleen Arbour, NP 07/03/23 1629    Alleen Arbour, NP 07/03/23 1630

## 2023-07-03 NOTE — Discharge Instructions (Signed)
 Start doxycycline twice daily for 10 days.  Monitor the area of infection closely and if you notice it is continuing to expand/worsen and/or you develop fevers or chills please seek reevaluation in the emergency room.  Follow-up with your PCP in 2 to 3 days for recheck.  Hope you feel better soon!

## 2023-07-07 ENCOUNTER — Other Ambulatory Visit: Payer: Self-pay | Admitting: Neurosurgery

## 2023-07-07 NOTE — Progress Notes (Signed)
 Surgical Instructions   Your procedure is scheduled on Monday July 12, 2023. Report to [facility] Main Entrance A at 9:30 A.M., then check in with the Admitting office. Any questions or running late day of surgery: call [phone]  Questions prior to your surgery date: call [phone], Monday-Friday, 8am-4pm. If you experience any cold or flu symptoms such as cough, fever, chills, shortness of breath, etc. between now and your scheduled surgery, please notify us  at the above number.     Remember:  Do not eat after midnight the night before your surgery  You may drink clear liquids until 8:30 the morning of your surgery.   Clear liquids allowed are: Water, Non-Citrus Juices (without pulp), Carbonated Beverages, Clear Tea (no milk, honey, etc.), Black Coffee Only (NO MILK, CREAM OR POWDERED CREAMER of any kind), and Gatorade.    Take these medicines the morning of surgery with A SIP OF WATER  doxycycline (VIBRAMYCIN)  levothyroxine (SYNTHROID)  methylPREDNISolone  (MEDROL  DOSEPAK)   One week prior to surgery, STOP taking any Aspirin (unless otherwise instructed by your surgeon) Aleve, Naproxen, Ibuprofen , Motrin , Advil , Goody's, BC's, all herbal medications, fish oil, and non-prescription vitamins.                     Do NOT Smoke (Tobacco/Vaping) for 24 hours prior to your procedure.  If you use a CPAP at night, you may bring your mask/headgear for your overnight stay.   You will be asked to remove any contacts, glasses, piercing's, hearing aid's, dentures/partials prior to surgery. Please bring cases for these items if needed.    Patients discharged the day of surgery will not be allowed to drive home, and someone needs to stay with them for 24 hours.  SURGICAL WAITING ROOM VISITATION Patients may have no more than 2 support people in the waiting area - these visitors may rotate.   Pre-op nurse will coordinate an appropriate time for 1 ADULT support person, who may not  rotate, to accompany patient in pre-op.  Children under the age of 61 must have an adult with them who is not the patient and must remain in the main waiting area with an adult.  If the patient needs to stay at the hospital during part of their recovery, the visitor guidelines for inpatient rooms apply.  Please refer to the [facility] website for the visitor guidelines for any additional information.   If you received a COVID test during your pre-op visit  it is requested that you wear a mask when out in public, stay away from anyone that may not be feeling well and notify your surgeon if you develop symptoms. If you have been in contact with anyone that has tested positive in the last 10 days please notify you surgeon.      Pre-operative 5 CHG Bathing Instructions   You can play a key role in reducing the risk of infection after surgery. Your skin needs to be as free of germs as possible. You can reduce the number of germs on your skin by washing with CHG (chlorhexidine  gluconate) soap before surgery. CHG is an antiseptic soap that kills germs and continues to kill germs even after washing.   DO NOT use if you have an allergy to chlorhexidine /CHG or antibacterial soaps. If your skin becomes reddened or irritated, stop using the CHG and notify one of our RNs at [phone].   Please shower with the CHG soap starting 4 days before surgery using the following  schedule:     Please keep in mind the following:  DO NOT shave, including legs and underarms, starting the day of your first shower.   You may shave your face at any point before/day of surgery.  Place clean sheets on your bed the day you start using CHG soap. Use a clean washcloth (not used since being washed) for each shower. DO NOT sleep with pets once you start using the CHG.   CHG Shower Instructions:  Wash your face and private area with normal soap. If you choose to wash your hair, wash first with your normal shampoo.   After you use shampoo/soap, rinse your hair and body thoroughly to remove shampoo/soap residue.  Turn the water OFF and apply about 3 tablespoons (45 ml) of CHG soap to a CLEAN washcloth.  Apply CHG soap ONLY FROM YOUR NECK DOWN TO YOUR TOES (washing for 3-5 minutes)  DO NOT use CHG soap on face, private areas, open wounds, or sores.  Pay special attention to the area where your surgery is being performed.  If you are having back surgery, having someone wash your back for you may be helpful. Wait 2 minutes after CHG soap is applied, then you may rinse off the CHG soap.  Pat dry with a clean towel  Put on clean clothes/pajamas   If you choose to wear lotion, please use ONLY the CHG-compatible lotions that are listed below.  Additional instructions for the day of surgery: DO NOT APPLY any lotions, deodorants, cologne, or perfumes.   Do not bring valuables to the hospital. [facility] is not responsible for any belongings/valuables. Do not wear nail polish, gel polish, artificial nails, or any other type of covering on natural nails (fingers and toes) Do not wear jewelry or makeup Put on clean/comfortable clothes.  Please brush your teeth.  Ask your nurse before applying any prescription medications to the skin.     CHG Compatible Lotions   Aveeno Moisturizing lotion  Cetaphil Moisturizing Cream  Cetaphil Moisturizing Lotion  Clairol Herbal Essence Moisturizing Lotion, Dry Skin  Clairol Herbal Essence Moisturizing Lotion, Extra Dry Skin  Clairol Herbal Essence Moisturizing Lotion, Normal Skin  Curel Age Defying Therapeutic Moisturizing Lotion with Alpha Hydroxy  Curel Extreme Care Body Lotion  Curel Soothing Hands Moisturizing Hand Lotion  Curel Therapeutic Moisturizing Cream, Fragrance-Free  Curel Therapeutic Moisturizing Lotion, Fragrance-Free  Curel Therapeutic Moisturizing Lotion, Original Formula  Eucerin Daily Replenishing Lotion  Eucerin Dry Skin Therapy Plus Alpha Hydroxy  Crme  Eucerin Dry Skin Therapy Plus Alpha Hydroxy Lotion  Eucerin Original Crme  Eucerin Original Lotion  Eucerin Plus Crme Eucerin Plus Lotion  Eucerin TriLipid Replenishing Lotion  Keri Anti-Bacterial Hand Lotion  Keri Deep Conditioning Original Lotion Dry Skin Formula Softly Scented  Keri Deep Conditioning Original Lotion, Fragrance Free Sensitive Skin Formula  Keri Lotion Fast Absorbing Fragrance Free Sensitive Skin Formula  Keri Lotion Fast Absorbing Softly Scented Dry Skin Formula  Keri Original Lotion  Keri Skin Renewal Lotion Keri Silky Smooth Lotion  Keri Silky Smooth Sensitive Skin Lotion  Nivea Body Creamy Conditioning Oil  Nivea Body Extra Enriched Lotion  Nivea Body Original Lotion  Nivea Body Sheer Moisturizing Lotion Nivea Crme  Nivea Skin Firming Lotion  NutraDerm 30 Skin Lotion  NutraDerm Skin Lotion  NutraDerm Therapeutic Skin Cream  NutraDerm Therapeutic Skin Lotion  ProShield Protective Hand Cream  Provon moisturizing lotion  Please read over the following fact sheets that you were given.

## 2023-07-08 ENCOUNTER — Encounter (HOSPITAL_COMMUNITY)
Admission: RE | Admit: 2023-07-08 | Discharge: 2023-07-08 | Disposition: A | Discharge status: Home / Self Care | Source: Ambulatory Visit | Attending: Neurosurgery | Admitting: Neurosurgery

## 2023-07-08 ENCOUNTER — Encounter (HOSPITAL_COMMUNITY): Payer: Self-pay

## 2023-07-08 ENCOUNTER — Other Ambulatory Visit: Payer: Self-pay

## 2023-07-08 VITALS — BP 125/86 | HR 69 | Temp 98.3°F | Resp 16 | Ht 68.0 in | Wt 214.8 lb

## 2023-07-08 DIAGNOSIS — Z01818 Encounter for other preprocedural examination: Secondary | ICD-10-CM

## 2023-07-08 DIAGNOSIS — Z01812 Encounter for preprocedural laboratory examination: Secondary | ICD-10-CM | POA: Diagnosis present

## 2023-07-08 HISTORY — DX: Hypothyroidism, unspecified: E03.9

## 2023-07-08 LAB — TYPE AND SCREEN
ABO/RH(D): A POS
Antibody Screen: NEGATIVE

## 2023-07-08 LAB — SURGICAL PCR SCREEN
MRSA, PCR: NEGATIVE
Staphylococcus aureus: NEGATIVE

## 2023-07-08 LAB — CBC
HCT: 40.2 % (ref 36.0–46.0)
Hemoglobin: 13 g/dL (ref 12.0–15.0)
MCH: 30 pg (ref 26.0–34.0)
MCHC: 32.3 g/dL (ref 30.0–36.0)
MCV: 92.8 fL (ref 80.0–100.0)
Platelets: 318 10*3/uL (ref 150–400)
RBC: 4.33 MIL/uL (ref 3.87–5.11)
RDW: 12.8 % (ref 11.5–15.5)
WBC: 7.7 10*3/uL (ref 4.0–10.5)
nRBC: 0 % (ref 0.0–0.2)

## 2023-07-08 NOTE — Progress Notes (Signed)
 PCP - Denies Cardiologist - Denies  PPM/ICD - Denies Device Orders -  Rep Notified -   Chest x-ray - na EKG - na Stress Test - denies ECHO - denies Cardiac Cath - denies  Sleep Study - denies CPAP -   Fasting Blood Sugar - na Checks Blood Sugar _____ times a day  Last dose of GLP1 agonist-  na GLP1 instructions:   Blood Thinner Instructions:na Aspirin Instructions:na  ERAS Protcol -clears until 0830 PRE-SURGERY Ensure or G2- no  COVID TEST- na   Anesthesia review: notified Allison Zelenak, PA-C via secure chat of patient's UC visit on 07/03/23. At PAT visit patient reports she never had a fever and the area of redness and warmth she had when she went to Urgent Care had cleared by Sunday 6/15. She did not take any of the Doxycycline she was prescribed. The area looked normal at her PAT visit. This information was reported to [place] who recommended that patient notify Dr. Lamon Pillow of the incident. Patient stated that she would call the office today.   Patient denies shortness of breath, fever, cough and chest pain at PAT appointment   All instructions explained to the patient, with a verbal understanding of the material. Patient agrees to go over the instructions while at home for a better understanding. Patient also instructed to self quarantine after being tested for COVID-19. The opportunity to ask questions was provided.

## 2023-07-08 NOTE — Progress Notes (Signed)
 Surgical Instructions   Your procedure is scheduled on Monday July 12, 2023. Report to [facility] Main Entrance A at 9:30 A.M., then check in with the Admitting office. Any questions or running late day of surgery: call [phone]  Questions prior to your surgery date: call [phone], Monday-Friday, 8am-4pm. If you experience any cold or flu symptoms such as cough, fever, chills, shortness of breath, etc. between now and your scheduled surgery, please notify us  at the above number.     Remember:  Do not eat after midnight the night before your surgery  You may drink clear liquids until 8:30 the morning of your surgery.   Clear liquids allowed are: Water, Non-Citrus Juices (without pulp), Carbonated Beverages, Clear Tea (no milk, honey, etc.), Black Coffee Only (NO MILK, CREAM OR POWDERED CREAMER of any kind), and Gatorade.    Take these medicines the morning of surgery with A SIP OF WATER  levothyroxine (SYNTHROID)    One week prior to surgery, STOP taking any Aspirin (unless otherwise instructed by your surgeon) Aleve, Naproxen, Ibuprofen , Motrin , Advil , Goody's, BC's, all herbal medications, fish oil, and non-prescription vitamins.                     Do NOT Smoke (Tobacco/Vaping) for 24 hours prior to your procedure.  If you use a CPAP at night, you may bring your mask/headgear for your overnight stay.   You will be asked to remove any contacts, glasses, piercing's, hearing aid's, dentures/partials prior to surgery. Please bring cases for these items if needed.    Patients discharged the day of surgery will not be allowed to drive home, and someone needs to stay with them for 24 hours.  SURGICAL WAITING ROOM VISITATION Patients may have no more than 2 support people in the waiting area - these visitors may rotate.   Pre-op nurse will coordinate an appropriate time for 1 ADULT support person, who may not rotate, to accompany patient in pre-op.  Children under the age of  12 must have an adult with them who is not the patient and must remain in the main waiting area with an adult.  If the patient needs to stay at the hospital during part of their recovery, the visitor guidelines for inpatient rooms apply.  Please refer to the [facility] website for the visitor guidelines for any additional information.   If you received a COVID test during your pre-op visit  it is requested that you wear a mask when out in public, stay away from anyone that may not be feeling well and notify your surgeon if you develop symptoms. If you have been in contact with anyone that has tested positive in the last 10 days please notify you surgeon.      Pre-operative 5 CHG Bathing Instructions   You can play a key role in reducing the risk of infection after surgery. Your skin needs to be as free of germs as possible. You can reduce the number of germs on your skin by washing with CHG (chlorhexidine  gluconate) soap before surgery. CHG is an antiseptic soap that kills germs and continues to kill germs even after washing.   DO NOT use if you have an allergy to chlorhexidine /CHG or antibacterial soaps. If your skin becomes reddened or irritated, stop using the CHG and notify one of our RNs at [phone].   Please shower with the CHG soap starting 4 days before surgery using the following schedule:     Please  keep in mind the following:  DO NOT shave, including legs and underarms, starting the day of your first shower.   You may shave your face at any point before/day of surgery.  Place clean sheets on your bed the day you start using CHG soap. Use a clean washcloth (not used since being washed) for each shower. DO NOT sleep with pets once you start using the CHG.   CHG Shower Instructions:  Wash your face and private area with normal soap. If you choose to wash your hair, wash first with your normal shampoo.  After you use shampoo/soap, rinse your hair and body thoroughly to  remove shampoo/soap residue.  Turn the water OFF and apply about 3 tablespoons (45 ml) of CHG soap to a CLEAN washcloth.  Apply CHG soap ONLY FROM YOUR NECK DOWN TO YOUR TOES (washing for 3-5 minutes)  DO NOT use CHG soap on face, private areas, open wounds, or sores.  Pay special attention to the area where your surgery is being performed.  If you are having back surgery, having someone wash your back for you may be helpful. Wait 2 minutes after CHG soap is applied, then you may rinse off the CHG soap.  Pat dry with a clean towel  Put on clean clothes/pajamas   If you choose to wear lotion, please use ONLY the CHG-compatible lotions that are listed below.  Additional instructions for the day of surgery: DO NOT APPLY any lotions, deodorants, cologne, or perfumes.   Do not bring valuables to the hospital. [facility] is not responsible for any belongings/valuables. Do not wear nail polish, gel polish, artificial nails, or any other type of covering on natural nails (fingers and toes) Do not wear jewelry or makeup Put on clean/comfortable clothes.  Please brush your teeth.  Ask your nurse before applying any prescription medications to the skin.     CHG Compatible Lotions   Aveeno Moisturizing lotion  Cetaphil Moisturizing Cream  Cetaphil Moisturizing Lotion  Clairol Herbal Essence Moisturizing Lotion, Dry Skin  Clairol Herbal Essence Moisturizing Lotion, Extra Dry Skin  Clairol Herbal Essence Moisturizing Lotion, Normal Skin  Curel Age Defying Therapeutic Moisturizing Lotion with Alpha Hydroxy  Curel Extreme Care Body Lotion  Curel Soothing Hands Moisturizing Hand Lotion  Curel Therapeutic Moisturizing Cream, Fragrance-Free  Curel Therapeutic Moisturizing Lotion, Fragrance-Free  Curel Therapeutic Moisturizing Lotion, Original Formula  Eucerin Daily Replenishing Lotion  Eucerin Dry Skin Therapy Plus Alpha Hydroxy Crme  Eucerin Dry Skin Therapy Plus Alpha Hydroxy Lotion  Eucerin  Original Crme  Eucerin Original Lotion  Eucerin Plus Crme Eucerin Plus Lotion  Eucerin TriLipid Replenishing Lotion  Keri Anti-Bacterial Hand Lotion  Keri Deep Conditioning Original Lotion Dry Skin Formula Softly Scented  Keri Deep Conditioning Original Lotion, Fragrance Free Sensitive Skin Formula  Keri Lotion Fast Absorbing Fragrance Free Sensitive Skin Formula  Keri Lotion Fast Absorbing Softly Scented Dry Skin Formula  Keri Original Lotion  Keri Skin Renewal Lotion Keri Silky Smooth Lotion  Keri Silky Smooth Sensitive Skin Lotion  Nivea Body Creamy Conditioning Oil  Nivea Body Extra Enriched Lotion  Nivea Body Original Lotion  Nivea Body Sheer Moisturizing Lotion Nivea Crme  Nivea Skin Firming Lotion  NutraDerm 30 Skin Lotion  NutraDerm Skin Lotion  NutraDerm Therapeutic Skin Cream  NutraDerm Therapeutic Skin Lotion  ProShield Protective Hand Cream  Provon moisturizing lotion  Please read over the following fact sheets that you were given.

## 2023-07-12 ENCOUNTER — Inpatient Hospital Stay (HOSPITAL_COMMUNITY)

## 2023-07-12 ENCOUNTER — Inpatient Hospital Stay (HOSPITAL_COMMUNITY): Admitting: Anesthesiology

## 2023-07-12 ENCOUNTER — Encounter (HOSPITAL_COMMUNITY): Payer: Self-pay | Admitting: Neurosurgery

## 2023-07-12 ENCOUNTER — Encounter (HOSPITAL_COMMUNITY)
Admission: RE | Disposition: A | Discharge status: Home / Self Care | Payer: Self-pay | Source: Home / Self Care | Attending: Neurosurgery

## 2023-07-12 ENCOUNTER — Other Ambulatory Visit: Payer: Self-pay

## 2023-07-12 ENCOUNTER — Inpatient Hospital Stay (HOSPITAL_COMMUNITY)
Admission: RE | Admit: 2023-07-12 | Discharge: 2023-07-13 | DRG: 473 | Disposition: A | Discharge status: Home / Self Care | Attending: Neurosurgery | Admitting: Neurosurgery

## 2023-07-12 DIAGNOSIS — Z853 Personal history of malignant neoplasm of breast: Secondary | ICD-10-CM | POA: Diagnosis not present

## 2023-07-12 DIAGNOSIS — M4722 Other spondylosis with radiculopathy, cervical region: Secondary | ICD-10-CM | POA: Diagnosis present

## 2023-07-12 DIAGNOSIS — Z8349 Family history of other endocrine, nutritional and metabolic diseases: Secondary | ICD-10-CM

## 2023-07-12 DIAGNOSIS — Z91011 Allergy to milk products: Secondary | ICD-10-CM | POA: Diagnosis not present

## 2023-07-12 DIAGNOSIS — Z885 Allergy status to narcotic agent status: Secondary | ICD-10-CM

## 2023-07-12 DIAGNOSIS — E669 Obesity, unspecified: Secondary | ICD-10-CM | POA: Diagnosis present

## 2023-07-12 DIAGNOSIS — Z91048 Other nonmedicinal substance allergy status: Secondary | ICD-10-CM

## 2023-07-12 DIAGNOSIS — Z6832 Body mass index (BMI) 32.0-32.9, adult: Secondary | ICD-10-CM | POA: Diagnosis not present

## 2023-07-12 DIAGNOSIS — Z923 Personal history of irradiation: Secondary | ICD-10-CM | POA: Diagnosis not present

## 2023-07-12 DIAGNOSIS — Z833 Family history of diabetes mellitus: Secondary | ICD-10-CM

## 2023-07-12 DIAGNOSIS — Z800 Family history of malignant neoplasm of digestive organs: Secondary | ICD-10-CM

## 2023-07-12 DIAGNOSIS — Z90710 Acquired absence of both cervix and uterus: Secondary | ICD-10-CM | POA: Diagnosis not present

## 2023-07-12 DIAGNOSIS — M5412 Radiculopathy, cervical region: Secondary | ICD-10-CM | POA: Diagnosis present

## 2023-07-12 DIAGNOSIS — Z801 Family history of malignant neoplasm of trachea, bronchus and lung: Secondary | ICD-10-CM | POA: Diagnosis not present

## 2023-07-12 DIAGNOSIS — Z888 Allergy status to other drugs, medicaments and biological substances status: Secondary | ICD-10-CM | POA: Diagnosis not present

## 2023-07-12 DIAGNOSIS — Z808 Family history of malignant neoplasm of other organs or systems: Secondary | ICD-10-CM

## 2023-07-12 DIAGNOSIS — Z79890 Hormone replacement therapy (postmenopausal): Secondary | ICD-10-CM | POA: Diagnosis not present

## 2023-07-12 DIAGNOSIS — M4802 Spinal stenosis, cervical region: Principal | ICD-10-CM | POA: Diagnosis present

## 2023-07-12 DIAGNOSIS — E890 Postprocedural hypothyroidism: Secondary | ICD-10-CM | POA: Diagnosis present

## 2023-07-12 HISTORY — PX: ANTERIOR CERVICAL DECOMP/DISCECTOMY FUSION: SHX1161

## 2023-07-12 SURGERY — ANTERIOR CERVICAL DECOMPRESSION/DISCECTOMY FUSION 3 LEVELS
Anesthesia: General

## 2023-07-12 MED ORDER — LORATADINE 10 MG PO TABS: 10.0000 mg | ORAL_TABLET | Freq: Every day | ORAL | Status: DC

## 2023-07-12 MED ORDER — THROMBIN 20000 UNITS EX SOLR
CUTANEOUS | Status: AC
  Filled 2023-07-12: qty 20000

## 2023-07-12 MED ORDER — PANTOPRAZOLE SODIUM 40 MG IV SOLR: 40.0000 mg | Freq: Every day | INTRAVENOUS | Status: DC

## 2023-07-12 MED ORDER — ALUM & MAG HYDROXIDE-SIMETH 200-200-20 MG/5ML PO SUSP: 30.0000 mL | Freq: Four times a day (QID) | ORAL | Status: DC | PRN

## 2023-07-12 MED ORDER — DEXMEDETOMIDINE HCL IN NACL 200 MCG/50ML IV SOLN
INTRAVENOUS | Status: DC | PRN
  Administered 2023-07-12: 4 ug via INTRAVENOUS
  Administered 2023-07-12: 8 ug via INTRAVENOUS
  Administered 2023-07-12: 4 ug via INTRAVENOUS

## 2023-07-12 MED ORDER — THROMBIN 5000 UNITS EX KIT
PACK | CUTANEOUS | Status: AC
Start: 2023-07-12 — End: 2023-07-12
  Filled 2023-07-12: qty 1

## 2023-07-12 MED ORDER — CEFAZOLIN SODIUM-DEXTROSE 2-3 GM-%(50ML) IV SOLR
INTRAVENOUS | Status: DC | PRN
Start: 2023-07-12 — End: 2023-07-12
  Administered 2023-07-12: 2 g via INTRAVENOUS

## 2023-07-12 MED ORDER — ROCURONIUM BROMIDE 10 MG/ML (PF) SYRINGE
PREFILLED_SYRINGE | INTRAVENOUS | Status: AC
  Filled 2023-07-12: qty 10

## 2023-07-12 MED ORDER — HYDROMORPHONE HCL 1 MG/ML IJ SOLN
0.5000 mg | INTRAMUSCULAR | Status: DC | PRN
  Administered 2023-07-12: 0.5 mg via INTRAVENOUS
  Filled 2023-07-12: qty 0.5

## 2023-07-12 MED ORDER — CEFAZOLIN SODIUM-DEXTROSE 2-4 GM/100ML-% IV SOLN
2.0000 g | Freq: Three times a day (TID) | INTRAVENOUS | Status: DC
  Administered 2023-07-12 – 2023-07-13 (×2): 2 g via INTRAVENOUS
  Filled 2023-07-12 (×2): qty 100

## 2023-07-12 MED ORDER — ROCURONIUM BROMIDE 10 MG/ML (PF) SYRINGE
PREFILLED_SYRINGE | INTRAVENOUS | Status: DC | PRN
  Administered 2023-07-12: 60 mg via INTRAVENOUS
  Administered 2023-07-12: 10 mg via INTRAVENOUS

## 2023-07-12 MED ORDER — CHLORHEXIDINE GLUCONATE 0.12 % MT SOLN
15.0000 mL | Freq: Once | OROMUCOSAL | Status: AC
  Administered 2023-07-12: 15 mL via OROMUCOSAL
  Filled 2023-07-12: qty 15

## 2023-07-12 MED ORDER — THROMBIN 5000 UNITS EX SOLR
OROMUCOSAL | Status: DC | PRN
  Administered 2023-07-12: 1000 mL via TOPICAL

## 2023-07-12 MED ORDER — LEVOTHYROXINE SODIUM 88 MCG PO TABS
88.0000 ug | ORAL_TABLET | Freq: Every morning | ORAL | Status: DC
  Administered 2023-07-13: 88 ug via ORAL
  Filled 2023-07-12: qty 1

## 2023-07-12 MED ORDER — FENTANYL CITRATE (PF) 100 MCG/2ML IJ SOLN
25.0000 ug | INTRAMUSCULAR | Status: DC | PRN
  Administered 2023-07-12 (×2): 25 ug via INTRAVENOUS

## 2023-07-12 MED ORDER — OXYCODONE HCL 5 MG/5ML PO SOLN
5.0000 mg | Freq: Once | ORAL | Status: AC
  Administered 2023-07-12: 5 mg via ORAL

## 2023-07-12 MED ORDER — MIDAZOLAM HCL 2 MG/2ML IJ SOLN
INTRAMUSCULAR | Status: DC | PRN
  Administered 2023-07-12: 2 mg via INTRAVENOUS

## 2023-07-12 MED ORDER — DEXAMETHASONE SODIUM PHOSPHATE 10 MG/ML IJ SOLN
INTRAMUSCULAR | Status: AC
  Filled 2023-07-12: qty 1

## 2023-07-12 MED ORDER — ACETAMINOPHEN 650 MG RE SUPP: 650.0000 mg | RECTAL | Status: DC | PRN

## 2023-07-12 MED ORDER — ORAL CARE MOUTH RINSE: 15.0000 mL | Freq: Once | OROMUCOSAL | Status: AC

## 2023-07-12 MED ORDER — DEXAMETHASONE SODIUM PHOSPHATE 10 MG/ML IJ SOLN
INTRAMUSCULAR | Status: DC | PRN
  Administered 2023-07-12: 10 mg via INTRAVENOUS

## 2023-07-12 MED ORDER — PHENYLEPHRINE 80 MCG/ML (10ML) SYRINGE FOR IV PUSH (FOR BLOOD PRESSURE SUPPORT)
PREFILLED_SYRINGE | INTRAVENOUS | Status: AC
  Filled 2023-07-12: qty 10

## 2023-07-12 MED ORDER — LACTATED RINGERS IV SOLN: INTRAVENOUS | Status: DC

## 2023-07-12 MED ORDER — ONDANSETRON HCL 4 MG/2ML IJ SOLN
INTRAMUSCULAR | Status: AC
  Filled 2023-07-12: qty 2

## 2023-07-12 MED ORDER — HYDROCODONE-ACETAMINOPHEN 7.5-325 MG/15ML PO SOLN
20.0000 mL | ORAL | Status: DC | PRN
  Administered 2023-07-13 (×3): 20 mL via ORAL
  Filled 2023-07-12 (×3): qty 30

## 2023-07-12 MED ORDER — OXYCODONE HCL 5 MG/5ML PO SOLN
ORAL | Status: AC
  Filled 2023-07-12: qty 5

## 2023-07-12 MED ORDER — SODIUM CHLORIDE 0.9% FLUSH
3.0000 mL | Freq: Two times a day (BID) | INTRAVENOUS | Status: DC
  Administered 2023-07-12: 3 mL via INTRAVENOUS

## 2023-07-12 MED ORDER — TURMERIC 500 MG PO CAPS: ORAL_CAPSULE | Freq: Every day | ORAL | Status: DC

## 2023-07-12 MED ORDER — SODIUM CHLORIDE 0.9% FLUSH: 3.0000 mL | INTRAVENOUS | Status: DC | PRN

## 2023-07-12 MED ORDER — SODIUM CHLORIDE 0.9 % IV SOLN
250.0000 mL | INTRAVENOUS | Status: DC
  Administered 2023-07-12: 250 mL via INTRAVENOUS

## 2023-07-12 MED ORDER — CICLOPIROX 8 % EX SOLN: 1.0000 | Freq: Every day | CUTANEOUS | Status: DC | PRN

## 2023-07-12 MED ORDER — LIDOCAINE 2% (20 MG/ML) 5 ML SYRINGE
INTRAMUSCULAR | Status: AC
  Filled 2023-07-12: qty 5

## 2023-07-12 MED ORDER — 0.9 % SODIUM CHLORIDE (POUR BTL) OPTIME
TOPICAL | Status: DC | PRN
  Administered 2023-07-12: 1000 mL

## 2023-07-12 MED ORDER — PHENYLEPHRINE HCL-NACL 20-0.9 MG/250ML-% IV SOLN
INTRAVENOUS | Status: DC | PRN
  Administered 2023-07-12: 10 ug/min via INTRAVENOUS

## 2023-07-12 MED ORDER — LIDOCAINE 2% (20 MG/ML) 5 ML SYRINGE
INTRAMUSCULAR | Status: DC | PRN
  Administered 2023-07-12: 40 mg via INTRAVENOUS

## 2023-07-12 MED ORDER — ACETAMINOPHEN 325 MG PO TABS: 650.0000 mg | ORAL_TABLET | ORAL | Status: DC | PRN

## 2023-07-12 MED ORDER — MIDAZOLAM HCL 2 MG/2ML IJ SOLN
INTRAMUSCULAR | Status: AC
Start: 2023-07-12 — End: 2023-07-12
  Filled 2023-07-12: qty 2

## 2023-07-12 MED ORDER — PHENOL 1.4 % MT LIQD
1.0000 | OROMUCOSAL | Status: DC | PRN
  Filled 2023-07-12: qty 177

## 2023-07-12 MED ORDER — FENTANYL CITRATE (PF) 250 MCG/5ML IJ SOLN
INTRAMUSCULAR | Status: DC | PRN
  Administered 2023-07-12: 100 ug via INTRAVENOUS
  Administered 2023-07-12 (×3): 50 ug via INTRAVENOUS

## 2023-07-12 MED ORDER — ONDANSETRON HCL 4 MG/2ML IJ SOLN
INTRAMUSCULAR | Status: DC | PRN
  Administered 2023-07-12: 4 mg via INTRAVENOUS

## 2023-07-12 MED ORDER — CYCLOBENZAPRINE HCL 10 MG PO TABS: 10.0000 mg | ORAL_TABLET | Freq: Three times a day (TID) | ORAL | Status: DC | PRN

## 2023-07-12 MED ORDER — MENTHOL 3 MG MT LOZG: 1.0000 | LOZENGE | OROMUCOSAL | Status: DC | PRN

## 2023-07-12 MED ORDER — DEXMEDETOMIDINE HCL IN NACL 80 MCG/20ML IV SOLN
INTRAVENOUS | Status: AC
  Filled 2023-07-12: qty 20

## 2023-07-12 MED ORDER — AMISULPRIDE (ANTIEMETIC) 5 MG/2ML IV SOLN: 10.0000 mg | Freq: Once | INTRAVENOUS | Status: DC | PRN

## 2023-07-12 MED ORDER — ONDANSETRON HCL 4 MG PO TABS: 4.0000 mg | ORAL_TABLET | Freq: Four times a day (QID) | ORAL | Status: DC | PRN

## 2023-07-12 MED ORDER — HYDROCODONE-ACETAMINOPHEN 5-325 MG PO TABS: 2.0000 | ORAL_TABLET | ORAL | Status: DC | PRN

## 2023-07-12 MED ORDER — PROPOFOL 10 MG/ML IV BOLUS
INTRAVENOUS | Status: AC
  Filled 2023-07-12: qty 20

## 2023-07-12 MED ORDER — VITAMIN E 45 MG (100 UNIT) PO CAPS
200.0000 [IU] | ORAL_CAPSULE | Freq: Every day | ORAL | Status: DC
  Filled 2023-07-12 (×2): qty 2

## 2023-07-12 MED ORDER — FENTANYL CITRATE (PF) 100 MCG/2ML IJ SOLN
INTRAMUSCULAR | Status: AC
  Filled 2023-07-12: qty 2

## 2023-07-12 MED ORDER — PANTOPRAZOLE SODIUM 40 MG PO TBEC
40.0000 mg | DELAYED_RELEASE_TABLET | Freq: Every day | ORAL | Status: DC
  Administered 2023-07-12: 40 mg via ORAL
  Filled 2023-07-12: qty 1

## 2023-07-12 MED ORDER — ACETAMINOPHEN 10 MG/ML IV SOLN
1000.0000 mg | Freq: Once | INTRAVENOUS | Status: DC | PRN
  Administered 2023-07-12: 1000 mg via INTRAVENOUS

## 2023-07-12 MED ORDER — SUGAMMADEX SODIUM 200 MG/2ML IV SOLN
INTRAVENOUS | Status: DC | PRN
  Administered 2023-07-12: 200 mg via INTRAVENOUS

## 2023-07-12 MED ORDER — THROMBIN 20000 UNITS EX SOLR: CUTANEOUS | Status: DC | PRN

## 2023-07-12 MED ORDER — DOXYCYCLINE HYCLATE 100 MG PO TABS
100.0000 mg | ORAL_TABLET | Freq: Two times a day (BID) | ORAL | Status: DC
  Administered 2023-07-12: 100 mg via ORAL
  Filled 2023-07-12: qty 1

## 2023-07-12 MED ORDER — DOCUSATE SODIUM 100 MG PO CAPS: 100.0000 mg | ORAL_CAPSULE | Freq: Every day | ORAL | Status: DC | PRN

## 2023-07-12 MED ORDER — PROPOFOL 10 MG/ML IV BOLUS
INTRAVENOUS | Status: DC | PRN
  Administered 2023-07-12: 200 mg via INTRAVENOUS

## 2023-07-12 MED ORDER — FENTANYL CITRATE (PF) 250 MCG/5ML IJ SOLN
INTRAMUSCULAR | Status: AC
  Filled 2023-07-12: qty 5

## 2023-07-12 MED ORDER — ACETAMINOPHEN 10 MG/ML IV SOLN
INTRAVENOUS | Status: AC
  Filled 2023-07-12: qty 100

## 2023-07-12 MED ORDER — ONDANSETRON HCL 4 MG/2ML IJ SOLN: 4.0000 mg | Freq: Four times a day (QID) | INTRAMUSCULAR | Status: DC | PRN

## 2023-07-12 MED ORDER — VITAMIN D-VITAMIN K 20-120 MCG/0.1ML PO LIQD: Freq: Every day | ORAL | Status: DC

## 2023-07-12 SURGICAL SUPPLY — 60 items
BAG COUNTER SPONGE SURGICOUNT (BAG) ×1 IMPLANT
BAND RUBBER #18 3X1/16 STRL (MISCELLANEOUS) ×2 IMPLANT
BASKET BONE COLLECTION (BASKET) ×1 IMPLANT
BENZOIN TINCTURE PRP APPL 2/3 (GAUZE/BANDAGES/DRESSINGS) ×1 IMPLANT
BIT DRILL 13 (BIT) IMPLANT
BIT DRILL NEURO 2X3.1 SFT TUCH (MISCELLANEOUS) ×1 IMPLANT
BONE VIVIGEN FORMABLE 1.3CC (Bone Implant) ×2 IMPLANT
BUR MATCHSTICK NEURO 3.0 LAGG (BURR) ×1 IMPLANT
CANISTER SUCTION 3000ML PPV (SUCTIONS) ×1 IMPLANT
DERMABOND ADVANCED .7 DNX12 (GAUZE/BANDAGES/DRESSINGS) ×1 IMPLANT
DRAIN 7X20 FLAT PERF LF SIL ST (DRAIN) IMPLANT
DRAIN JP 7F FLT 3/4 PRF SI HBL (DRAIN) IMPLANT
DRAPE C-ARM 42X72 X-RAY (DRAPES) ×2 IMPLANT
DRAPE LAPAROTOMY 100X72 PEDS (DRAPES) ×1 IMPLANT
DRAPE MICROSCOPE SLANT 54X150 (MISCELLANEOUS) ×1 IMPLANT
DRSG OPSITE POSTOP 4X6 (GAUZE/BANDAGES/DRESSINGS) IMPLANT
DURAPREP 6ML APPLICATOR 50/CS (WOUND CARE) ×1 IMPLANT
ELECT COATED BLADE 2.86 ST (ELECTRODE) ×1 IMPLANT
ELECTRODE REM PT RTRN 9FT ADLT (ELECTROSURGICAL) ×1 IMPLANT
EVACUATOR SILICONE 100CC (DRAIN) IMPLANT
GAUZE 4X4 16PLY ~~LOC~~+RFID DBL (SPONGE) IMPLANT
GAUZE SPONGE 4X4 12PLY STRL (GAUZE/BANDAGES/DRESSINGS) ×1 IMPLANT
GLOVE BIO SURGEON STRL SZ7 (GLOVE) IMPLANT
GLOVE BIO SURGEON STRL SZ8 (GLOVE) ×1 IMPLANT
GLOVE BIOGEL PI IND STRL 7.0 (GLOVE) IMPLANT
GLOVE BIOGEL PI IND STRL 7.5 (GLOVE) IMPLANT
GLOVE EXAM NITRILE XL STR (GLOVE) IMPLANT
GLOVE INDICATOR 8.5 STRL (GLOVE) ×2 IMPLANT
GOWN STRL REUS W/ TWL LRG LVL3 (GOWN DISPOSABLE) IMPLANT
GOWN STRL REUS W/ TWL XL LVL3 (GOWN DISPOSABLE) ×1 IMPLANT
GOWN STRL REUS W/TWL 2XL LVL3 (GOWN DISPOSABLE) IMPLANT
GRAFT BNE MATRIX VG FRMBL SM 1 (Bone Implant) IMPLANT
HALTER HD/CHIN CERV TRACTION D (MISCELLANEOUS) ×1 IMPLANT
HEMOSTAT POWDER KIT SURGIFOAM (HEMOSTASIS) ×1 IMPLANT
KIT BASIN OR (CUSTOM PROCEDURE TRAY) ×1 IMPLANT
KIT TURNOVER KIT B (KITS) ×1 IMPLANT
NDL SPNL 20GX3.5 QUINCKE YW (NEEDLE) ×1 IMPLANT
NEEDLE SPNL 20GX3.5 QUINCKE YW (NEEDLE) ×1 IMPLANT
NS IRRIG 1000ML POUR BTL (IV SOLUTION) ×1 IMPLANT
PACK LAMINECTOMY NEURO (CUSTOM PROCEDURE TRAY) ×1 IMPLANT
PATTIES SURGICAL .5 X.5 (GAUZE/BANDAGES/DRESSINGS) ×1 IMPLANT
PATTIES SURGICAL 1X1 (DISPOSABLE) ×1 IMPLANT
PIN DISTRACTION 14MM (PIN) IMPLANT
PLATE 3 57.5XLCK NS SPNE CVD (Plate) IMPLANT
SCREW 4.0X14 (Screw) IMPLANT
SCREW ST 14X4XST FXANG SPNE (Screw) IMPLANT
SPACER HEDRON C 12X14X6 0D (Spacer) IMPLANT
SPACER HEDRON C 12X14X7 0D (Spacer) IMPLANT
SPIKE FLUID TRANSFER (MISCELLANEOUS) ×1 IMPLANT
SPONGE INTESTINAL PEANUT (DISPOSABLE) ×1 IMPLANT
SPONGE SURGIFOAM ABS GEL 100 (HEMOSTASIS) ×1 IMPLANT
STRIP CLOSURE SKIN 1/2X4 (GAUZE/BANDAGES/DRESSINGS) ×1 IMPLANT
SUT VIC AB 3-0 SH 8-18 (SUTURE) ×1 IMPLANT
SUT VIC AB 4-0 PS2 27 (SUTURE) ×1 IMPLANT
SYR 30ML SLIP (SYRINGE) ×1 IMPLANT
TAPE CLOTH 4X10 WHT NS (GAUZE/BANDAGES/DRESSINGS) IMPLANT
TOWEL GREEN STERILE (TOWEL DISPOSABLE) ×1 IMPLANT
TOWEL GREEN STERILE FF (TOWEL DISPOSABLE) ×1 IMPLANT
TRAY FOLEY MTR SLVR 16FR STAT (SET/KITS/TRAYS/PACK) IMPLANT
WATER STERILE IRR 1000ML POUR (IV SOLUTION) ×1 IMPLANT

## 2023-07-12 NOTE — Anesthesia Procedure Notes (Signed)
 Procedure Name: Intubation Date/Time: 07/12/2023 12:00 PM  Performed by: Tressie Gilmore RAMAN, CRNAPre-anesthesia Checklist: Patient identified, Emergency Drugs available, Suction available and Patient being monitored Patient Re-evaluated:Patient Re-evaluated prior to induction Oxygen Delivery Method: Circle System Utilized Preoxygenation: Pre-oxygenation with 100% oxygen Induction Type: IV induction Ventilation: Mask ventilation without difficulty Laryngoscope Size: Glidescope and 3 Grade View: Grade II Tube type: Oral Tube size: 7.0 mm Number of attempts: 1 Airway Equipment and Method: Stylet and Oral airway Placement Confirmation: ETT inserted through vocal cords under direct vision, positive ETCO2 and breath sounds checked- equal and bilateral Secured at: 21 cm Tube secured with: Tape Dental Injury: Teeth and Oropharynx as per pre-operative assessment  Comments: Neck remained in Neutral position.

## 2023-07-12 NOTE — Plan of Care (Signed)

## 2023-07-12 NOTE — Anesthesia Preprocedure Evaluation (Addendum)
 Anesthesia Evaluation  Patient identified by MRN, date of birth, ID band Patient awake    Reviewed: Allergy & Precautions, NPO status , Patient's Chart, lab work & pertinent test results  History of Anesthesia Complications (+) PONV and history of anesthetic complications  Airway Mallampati: II  TM Distance: >3 FB Neck ROM: Limited    Dental no notable dental hx.    Pulmonary neg pulmonary ROS   Pulmonary exam normal        Cardiovascular negative cardio ROS Normal cardiovascular exam     Neuro/Psych  negative psych ROS   GI/Hepatic negative GI ROS, Neg liver ROS,,,  Endo/Other  diabetesHypothyroidism    Renal/GU negative Renal ROS     Musculoskeletal negative musculoskeletal ROS (+)    Abdominal  (+) + obese  Peds  Hematology negative hematology ROS (+)   Anesthesia Other Findings Cervical radiculopathy at C7  Reproductive/Obstetrics S/p hysterectomy                             Anesthesia Physical Anesthesia Plan  ASA: 2  Anesthesia Plan: General   Post-op Pain Management:    Induction: Intravenous  PONV Risk Score and Plan: 4 or greater and Ondansetron , Dexamethasone , Midazolam , Scopolamine  patch - Pre-op and Treatment may vary due to age or medical condition  Airway Management Planned: Oral ETT and Video Laryngoscope Planned  Additional Equipment:   Intra-op Plan:   Post-operative Plan: Extubation in OR  Informed Consent: I have reviewed the patients History and Physical, chart, labs and discussed the procedure including the risks, benefits and alternatives for the proposed anesthesia with the patient or authorized representative who has indicated his/her understanding and acceptance.     Dental advisory given  Plan Discussed with: CRNA  Anesthesia Plan Comments:        Anesthesia Quick Evaluation

## 2023-07-12 NOTE — Op Note (Signed)
 Operative diagnosis: Cervical spondylosis with radiculopathy C4-5 C5-6 C6-7.  Postoperative diagnosis: Same.  Procedure: Anterior cervical discectomies and fusion at C4-5, C5-6, C6-7 lysing globus titanium cages packed with locally harvested autograft mixed with Vivigen and anterior cervical plating utilizing the Atlantis translational plating system from C4-C7.  Surgeon: Arley Molly.  Priscilla: Suzen Click.  Anesthesia: General.  EBL: Minimal.  HPI: 43 year old female progressive worsening neck pain left shoulder and arm pain rating down C5-C6-C7 nerve root pattern.  Workup revealed severe cervical spondylosis with stenosis and spurs displacing the C5-C6-C7 nerve roots respectively due to the patient's progression of clinical syndrome imaging findings of a conservative treatment I recommended anterior cervical discectomies and fusion at those 3 levels.  I extensively reviewed the risks and benefits of the operation with her as well as perioperative course expectations of outcome and alternatives to surgery and she understood and agreed to proceed forward.  Operative procedure: Patient was brought into the OR was induced under general anesthesia positioned supine the neck in slight extension 5 pounds of halter traction.  The right side of her neck was prepped and draped in routine sterile fashion.  Preoperative x-ray localized the above level so a curvilinear incision was made just off the midline to the anterior border of the sternocleidomastoid and superficial part of the platysma is dissected out divided longitudinally.  The avascular plane between the sternocleidomastoid and the strap muscle was developed down to the prevertebral fascia prevertebral fascia was dissected away with Kitners.  Interoperative x-ray confirmed defecation appropriate level so annulotomy was made with a 15 blade scalpel to mark to the space longus close reflected laterally self-retaining retractors were placed.  Anterior  ossified's were bitten off of the Leksell rongeur and a 2 and 3 Miller Kerrison punch.  All 3 disc bases were incised and scraped with an upgoing curette and under microscopic illumination all 3 disc bases were drilled down capturing the bone shavings and mucus trap.  First working at C4-5 under the microscope the posterior osteophytic complex was drilled away under biting both endplates allowed identification of posterior large to ligament which was removed piecemeal fashion.  Large central spur as well as marked uncinate hypertrophy especially on the left displacing the left V nerve root was visualized this was all removed decompressing the C5 nerve root and skeletonizing it flush with the pedicle.  Both C5 neuroforamina were widely patent at the end of the decompression this was packed with Gelfoam attention taken at C5-6 pathology here was central primarily but also at the proximal aspect of both C6 nerve root collapse extensive mount of uncinate was removed and all the spurring was removed we removed by aggressive undermining of the endplates both C6 pedicles were identified both C6 nerve root to decompress and skeletonized flush with the pedicle.  We then repositioned our attention to C6-7 in a similar fashion soft disc material here extensive uncinate perjury this was all removed posterior longitudinal ligament was also removed here and aggressive interbody both endplates decompress central canal both foramina were decompressed.  Then I sized up a 6 mm titanium cage at both C4-5 and C6-7 with a 7 mm at C5-6 all 3 cages were packed with extensive mount of autograft mix and Vivigen and inserted 2 mm deep to the anterior vertebral line.  I packed extensive mount of autograft material laterally to the cage and anterior to the cage underneath the plate sized up to 57.5 mm Atlantis translational plate all screws had excellent purchase locking  mechanism was a gauge.  Wound was then copiously irrigated 6 hemostasis  was maintained x-ray and fluoroscopy confirmed good position of the hardware I then closed the wound in layers with active Vicryl in the platysma and a running 4 subcuticular.  Dermabond benzoin Steri-Strips and a sterile dressing was applied patient to cover him in stable condition.  At the end the case all needle count sponge counts were correct.

## 2023-07-12 NOTE — H&P (Signed)
 Gabriela Evans is an 43 y.o. female.   Chief Complaint: Neck and left arm pain HPI: 43 year old female progressive worsening neck left shoulder and arm pain rating down the C5-C6-C7 nerve root pattern.  Workup revealed severe cervical spondylosis with large spondylitic spurs displacing the left C5-C6 and C7 nerve roots.  Due to the patient's progression of clinical syndrome imaging findings and failed conservative treatment we recommend a three-level anterior cervical discectomy and fusion at those levels.  We extensively reviewed the risks and benefits of the operation with the patient as well as perioperative course expectations of outcome and alternatives of surgery and she understood and agreed to proceed forward.  Past Medical History:  Diagnosis Date   Anemia    Breast cancer (HCC)    Complication of anesthesia    nausea   Gestational diabetes    Hypothyroidism    Kidney infection    Other abnormal glucose    Gestational diabetes with both children   Personal history of radiation therapy    Pneumonia    01-2019   PONV (postoperative nausea and vomiting)     Past Surgical History:  Procedure Laterality Date   APPENDECTOMY     BREAST LUMPECTOMY     BREAST LUMPECTOMY WITH RADIOACTIVE SEED AND SENTINEL LYMPH NODE BIOPSY Left 11/29/2020   Procedure: LEFT BREAST LUMPECTOMY WITH RADIOACTIVE SEED AND SENTINEL LYMPH NODE BIOPSY;  Surgeon: Curvin Deward MOULD, MD;  Location: [facility];  Service: General;  Laterality: Left;   CESAREAN SECTION     X2   IUD inserted in OR     LAPAROSCOPIC APPENDECTOMY N/A 07/08/2021   Procedure: APPENDECTOMY LAPAROSCOPIC;  Surgeon: Jordis Laneta FALCON, MD;  Location: [facility];  Service: General;  Laterality: N/A;   PELVIC LAPAROSCOPY  2009   remove perforated IUD   ROBOTIC ASSISTED LAPAROSCOPIC LYSIS OF ADHESION N/A 04/26/2019   Procedure: XI ROBOTIC ASSISTED LAPAROSCOPIC LYSIS OF ADHESION;  Surgeon: Lavoie, Marie-Lyne, MD;  [facility]: [facility];  Service: Gynecology;  Laterality: N/A;   ROBOTIC ASSISTED TOTAL HYSTERECTOMY WITH BILATERAL SALPINGO OOPHERECTOMY Bilateral 04/26/2019   Procedure: XI ROBOTIC ASSISTED TOTAL LAPAROSCOPIC HYSTERECTOMY WITH BILATERAL SALPINGECTOMY;  Surgeon: Lavoie, Marie-Lyne, MD;  Location: [facility];  Service: Gynecology;  Laterality: Bilateral;  requests 8:30 OR start in [place] [facility] block time requests 2 hours   THYROIDECTOMY N/A 05/01/2022   Procedure: TOTAL THYROIDECTOMY, POSSIBLE LEFT THYROID  LOBECTOMY;  Surgeon: Eletha Boas, MD;  Location: [facility];  Service: General;  Laterality: N/A;   WISDOM TOOTH EXTRACTION      Family History  Problem Relation Age of Onset   Diabetes Mother    Thyroid  disease Father    Diabetes Father    Thyroid  disease Sister    Thyroid  cancer Maternal Grandmother        follicular   Prostate cancer Maternal Grandfather        dx 63s   Melanoma Paternal Grandmother        mets; dx 49s   Thyroid  cancer Maternal Aunt        papillary; dx 41s   Lung cancer Maternal Aunt    Brain cancer Maternal Uncle 2       gliolblastoma   Rectal cancer Paternal Uncle        mets; dx 100s   Cancer Paternal Uncle        unknown type; dx 75s   Esophageal cancer Neg Hx    Pancreatic cancer Neg Hx  Social History:  reports that she has never smoked. She has never used smokeless tobacco. She reports that she does not currently use alcohol. She reports that she does not use drugs.  Allergies:  Allergies  Allergen Reactions   Nickel Swelling   Gluten Meal Nausea Only   Milk-Related Compounds Nausea And Vomiting    Dairy products   Other     Sensitive to gluten and dairy   Tramadol  Nausea Only and Swelling   Oxycodone -Acetaminophen  Itching   Tamoxifen  Itching and Rash    Medications Prior to Admission  Medication Sig Dispense Refill   cetirizine (ZYRTEC) 10 MG tablet Take 10 mg by mouth daily.     Docusate Calcium (STOOL SOFTENER PO) Take 1  capsule by mouth daily as needed (Constipation).     ibuprofen  (ADVIL ) 200 MG tablet Take 200 mg by mouth as needed for moderate pain (pain score 4-6).     levothyroxine (SYNTHROID) 88 MCG tablet Take 88 mcg by mouth every morning.     Turmeric (QC TUMERIC COMPLEX PO) Take 1 tablet by mouth daily.     VITAMIN D -VITAMIN K PO Take 1 tablet by mouth daily.     VITAMIN E PO Take 1 tablet by mouth daily.     ciclopirox (PENLAC) 8 % solution Apply 1 Application topically daily as needed (Fungus).     doxycycline  (VIBRAMYCIN ) 100 MG capsule Take 1 capsule (100 mg total) by mouth 2 (two) times daily. (Patient not taking: Reported on 07/08/2023) 20 capsule 0   methylPREDNISolone  (MEDROL  DOSEPAK) 4 MG TBPK tablet Take 4 mg by mouth. (Patient not taking: Reported on 07/08/2023)      No results found for this or any previous visit (from the past 48 hours). No results found.  Review of Systems  Musculoskeletal:  Positive for myalgias and neck pain.  Neurological:  Positive for weakness and numbness.    Blood pressure (!) (P) 147/98, pulse (P) 71, temperature (P) 98.2 F (36.8 C), temperature source (P) Oral, resp. rate (P) 18, height (P) 5' 8 (1.727 m), weight (P) 97.4 kg, last menstrual period 04/18/2019, SpO2 (P) 100%. Physical Exam HENT:     Head: Normocephalic.     Right Ear: Tympanic membrane normal.     Nose: Nose normal.     Mouth/Throat:     Mouth: Mucous membranes are moist.   Cardiovascular:     Rate and Rhythm: Normal rate.     Pulses: Normal pulses.  Pulmonary:     Effort: Pulmonary effort is normal.   Musculoskeletal:     Cervical back: Normal range of motion.   Neurological:     Mental Status: She is alert.     Comments: Strength is 5 out of 5 deltoid, bicep, tricep, wrist flexion, wrist extension, hand intrinsics.     Assessment/Plan 43 year old presents for ACDF C4-5 C5-6 C6-7  Arley SHAUNNA Helling, MD 07/12/2023, 11:32 AM

## 2023-07-12 NOTE — Progress Notes (Signed)
 Orthopedic Tech Progress Note Patient Details:  FARRIS BLASH [birth year] [id number]  Ortho Devices Type of Ortho Device: Soft collar Ortho Device/Splint Location: NECK Ortho Device/Splint Interventions: Ordered, Application, Adjustment   Post Interventions Patient Tolerated: Well Instructions Provided: Care of device  Delanna LITTIE Pac 07/12/2023, 5:43 PM

## 2023-07-12 NOTE — Transfer of Care (Signed)
 Immediate Anesthesia Transfer of Care Note  Patient: Gabriela Evans  Procedure(s) Performed: ANTERIOR CERVICAL DECOMPRESSION/DISCECTOMY FUSION 3 LEVELS  Patient Location: PACU  Anesthesia Type:General  Level of Consciousness: awake, alert , and oriented  Airway & Oxygen Therapy: Patient Spontanous Breathing  Post-op Assessment: Report given to RN, Post -op Vital signs reviewed and stable, Patient moving all extremities, and Patient moving all extremities X 4  Post vital signs: Reviewed and stable  Last Vitals:  Vitals Value Taken Time  BP 141/80 07/12/23 14:36  Temp 36.8 C 07/12/23 14:36  Pulse 67 07/12/23 14:40  Resp 20 07/12/23 14:40  SpO2 97 % 07/12/23 14:40  Vitals shown include unfiled device data.  Last Pain:  Vitals:   07/12/23 0938  TempSrc:   PainSc: 3          Complications: There were no known notable events for this encounter.

## 2023-07-13 MED ORDER — HYDROCODONE-ACETAMINOPHEN 7.5-325 MG/15ML PO SOLN: 20.0000 mL | ORAL | 0 refills | Status: AC | PRN

## 2023-07-13 MED ORDER — CYCLOBENZAPRINE HCL 10 MG PO TABS: 10.0000 mg | ORAL_TABLET | Freq: Three times a day (TID) | ORAL | 0 refills | Status: DC | PRN

## 2023-07-13 NOTE — Progress Notes (Signed)
 Patient alert and oriented, mae's well, voiding adequate amount of urine, swallowing without difficulty, no c/o pain at time of discharge. Patient discharged home with family. Script and discharged instructions given to patient. Patient and family stated understanding of instructions given. Patient has an appointment with Dr. Wynetta Emery

## 2023-07-13 NOTE — Discharge Instructions (Signed)
  Wound Care Keep incision covered and dry until post op day 3. You may remove the Honeycomb dressing on post op day 3. Leave steri-strips on neck.  They will fall off by themselves. Do not put any creams, lotions, or ointments on incision. You are fine to shower. Let water run over incision and pat dry.  Activity Walk each and every day, increasing distance each day. No lifting greater than 8 lbs.  Avoid excessive back motion. No driving, or riding a car unless coming back and forth to see the doctor  Diet Resume your normal diet.   Return to Work Will be discussed at your follow up appointment.  Call Your Doctor If Any of These Occur Redness, drainage, or swelling at the wound.  Temperature greater than 101 degrees. Severe pain not relieved by pain medication. Incision starts to come apart.  Follow Up Appt Call [phone] if you have one or any problem.

## 2023-07-13 NOTE — Anesthesia Postprocedure Evaluation (Signed)
 Anesthesia Post Note  Patient: Gabriela Evans  Procedure(s) Performed: ANTERIOR CERVICAL DECOMPRESSION/DISCECTOMY FUSION 3 LEVELS     Patient location during evaluation: PACU Anesthesia Type: General Level of consciousness: awake and alert Pain management: pain level controlled Vital Signs Assessment: post-procedure vital signs reviewed and stable Respiratory status: spontaneous breathing, nonlabored ventilation, respiratory function stable and patient connected to nasal cannula oxygen Cardiovascular status: blood pressure returned to baseline and stable Postop Assessment: no apparent nausea or vomiting Anesthetic complications: no   There were no known notable events for this encounter.  Last Vitals:  Vitals:   07/12/23 2343 07/13/23 0306  BP: (!) 141/88 118/77  Pulse: 64 64  Resp: 18 18  Temp: 37.1 C 36.8 C  SpO2: 98% 98%    Last Pain:  Vitals:   07/13/23 0601  TempSrc:   PainSc: 4                  Devinn Hurwitz S

## 2023-07-13 NOTE — Discharge Summary (Signed)
 Physician Discharge Summary  Patient ID: Gabriela Evans MRN: [id number] DOB/AGE: 1980-02-11 43 y.o. Estimated body mass index is 32.66 kg/m (pended) as calculated from the following:   Height as of this encounter: (P) 5' 8 (1.727 m).   Weight as of this encounter: (P) 97.4 kg.   Admit date: 07/12/2023 Discharge date: 07/13/2023  Admission Diagnoses: Cervical spondylitic radiculopathy  Discharge Diagnoses: Same Principal Problem:   Spinal stenosis of cervical region   Discharged Condition: good  Hospital Course: Patient went to the hospital was admitted underwent anterior cervical discectomy and fusion postoperatively did very well recovered in the form for was ambulating and voiding spontaneously tolerating regular diet stable for discharge home.  Consults: Significant Diagnostic Studies: Treatments: ACDF C4-5 C5-6 C6-7 Discharge Exam: Blood pressure 124/71, pulse 72, temperature 98.4 F (36.9 C), temperature source Oral, resp. rate 16, height (P) 5' 8 (1.727 m), weight (P) 97.4 kg, last menstrual period 04/18/2019, SpO2 100%. Strength 5 out of 5 and clean dry and intact  Disposition: Home   Allergies as of 07/13/2023       Reactions   Nickel Swelling   Tramadol  Nausea Only, Swelling   Oxycodone -acetaminophen  Itching   Tamoxifen  Itching, Rash        Medication List     TAKE these medications    cetirizine 10 MG tablet Commonly known as: ZYRTEC Take 10 mg by mouth daily.   ciclopirox 8 % solution Commonly known as: PENLAC Apply 1 Application topically daily as needed (Fungus).   cyclobenzaprine 10 MG tablet Commonly known as: FLEXERIL Take 1 tablet (10 mg total) by mouth 3 (three) times daily as needed for muscle spasms.   doxycycline  100 MG capsule Commonly known as: VIBRAMYCIN  Take 1 capsule (100 mg total) by mouth 2 (two) times daily.   HYDROcodone -acetaminophen  7.5-325 mg/15 ml solution Commonly known as: HYCET Take 20 mLs by mouth every 4 (four)  hours as needed for severe pain (pain score 7-10).   ibuprofen  200 MG tablet Commonly known as: ADVIL  Take 200 mg by mouth as needed for moderate pain (pain score 4-6).   levothyroxine 88 MCG tablet Commonly known as: SYNTHROID Take 88 mcg by mouth every morning.   methylPREDNISolone  4 MG Tbpk tablet Commonly known as: MEDROL  DOSEPAK Take 4 mg by mouth.   QC TUMERIC COMPLEX PO Take 1 tablet by mouth daily.   STOOL SOFTENER PO Take 1 capsule by mouth daily as needed (Constipation).   VITAMIN D -VITAMIN K PO Take 1 tablet by mouth daily.   VITAMIN E PO Take 1 tablet by mouth daily.         Signed: Arley SHAUNNA Helling 07/13/2023, 8:08 AM

## 2023-07-13 NOTE — Evaluation (Signed)
 Occupational Therapy Evaluation Patient Details Name: Gabriela Evans MRN: [id number] DOB: 11/30/1980 Today's Date: 07/13/2023   History of Present Illness   43 yo female s/p 6/23 ACDF C4-5 C5-6 C6-6 lysing globus titanium cage with locally harvested autograft  PMH hx of Breat CA, gestational DM, anemia, hypthryoidism,hx radiation therapy, appendectomy, lumpectomy     Clinical Impressions Patient evaluated by Occupational Therapy with no further acute OT needs identified. All education has been completed and the patient has no further questions. See below for any follow-up Occupational Therapy or equipment needs. OT to sign off. Thank you for referral.       If plan is discharge home, recommend the following:         Functional Status Assessment   Patient has had a recent decline in their functional status and demonstrates the ability to make significant improvements in function in a reasonable and predictable amount of time.     Equipment Recommendations   None recommended by OT     Recommendations for Other Services         Precautions/Restrictions   Precautions Precautions: Cervical Recall of Precautions/Restrictions: Intact Precaution/Restrictions Comments: handout provided and reviwed for adls Required Braces or Orthoses: Cervical Brace Cervical Brace: Soft collar;For comfort Restrictions Weight Bearing Restrictions Per Provider Order: No     Mobility Bed Mobility Overal bed mobility: Independent                  Transfers Overall transfer level: Independent                        Balance Overall balance assessment: Mild deficits observed, not formally tested                                         ADL either performed or assessed with clinical judgement   ADL Overall ADL's : Modified independent                                       General ADL Comments: educated on adls with cervical  precautions with return demo. demonstrated stair transfer. pt with x2 events of vertigo and prior hx of this vestibular change. pt benefits from gaze stablization. pt with recovery using this strategy. educated on showering with warm water to decrease fall risk initially  Cervical precautions ( handout provided): Educated patient on don doff brace with return demonstration, educated on oral care using cups, washing face with cloth, never to wash directly on incision site, avoid neck rotation flexion and extension, positioning with pillows in chair for bil UE, sleeping positioning, avoiding pushing / pulling with bil UE, and fine motor exercises Instructed on lap slides for scapula mobilization.  Pt educated on need to notify doctor / RN of swallowing changes or choking..   Practices slide sleeping positioning and pillows.    Vision Baseline Vision/History: 0 No visual deficits Ability to See in Adequate Light: 0 Adequate Patient Visual Report: No change from baseline       Perception         Praxis         Pertinent Vitals/Pain Pain Assessment Pain Assessment: Faces Faces Pain Scale: Hurts little more Pain Location: neck posterior Pain Descriptors / Indicators: Discomfort Pain Intervention(s): Repositioned, Monitored during session (  Rn comign with pain meds at end of session)     Extremity/Trunk Assessment Upper Extremity Assessment Upper Extremity Assessment: Overall WFL for tasks assessed;LUE deficits/detail;Right hand dominant LUE Deficits / Details: denies any numbness or tinglinging. pt with increased pad to pad movement per patient. LUE Coordination: WNL   Lower Extremity Assessment Lower Extremity Assessment: Overall WFL for tasks assessed   Cervical / Trunk Assessment Cervical / Trunk Assessment: Neck Surgery   Communication     Cognition Arousal: Alert Behavior During Therapy: WFL for tasks assessed/performed Cognition: No apparent impairments                                Following commands: Intact       Cueing  General Comments          Exercises     Shoulder Instructions      Home Living Family/patient expects to be discharged to:: Private residence Living Arrangements: Spouse/significant other;Children Available Help at Discharge: Family;Available 24 hours/day Type of Home: House Home Access: Stairs to enter [employer] of Steps: 5 Entrance Stairs-Rails: Left;Right Home Layout: Two level;Able to live on main level with bedroom/bathroom;Full bath on main level     Bathroom Shower/Tub: Producer, television/film/video: Standard     Home Equipment: None   Additional Comments: spouse works from home 4 kids in the home 16 13 13 10  and 2 dogs. dogs can be let out by children. childrent help with clean up and laudnry      Prior Functioning/Environment Prior Level of Function : Independent/Modified Independent                    OT Problem List:     OT Treatment/Interventions:        OT Goals(Current goals can be found in the care plan section)   Acute Rehab OT Goals Potential to Achieve Goals: Good   OT Frequency:       Co-evaluation              AM-PAC OT 6 Clicks Daily Activity     Outcome Measure Help from another person eating meals?: None Help from another person taking care of personal grooming?: None Help from another person toileting, which includes using toliet, bedpan, or urinal?: None Help from another person bathing (including washing, rinsing, drying)?: None Help from another person to put on and taking off regular upper body clothing?: None Help from another person to put on and taking off regular lower body clothing?: None 6 Click Score: 24   End of Session Nurse Communication: Mobility status  Activity Tolerance: Patient tolerated treatment well Patient left: in bed;with call bell/phone within reach;with family/visitor present  OT Visit Diagnosis:  Unsteadiness on feet (R26.81)                Time: 9088-9048 OT Time Calculation (min): 40 min Charges:  OT General Charges $OT Visit: 1 Visit OT Evaluation $OT Eval Moderate Complexity: 1 Mod OT Treatments $Self Care/Home Management : 8-22 mins   Brynn, OTR/L  Acute Rehabilitation Services Office: [phone] .   Ely Molt 07/13/2023, 10:27 AM

## 2023-07-14 ENCOUNTER — Encounter (HOSPITAL_COMMUNITY): Payer: Self-pay | Admitting: Neurosurgery

## 2023-09-08 ENCOUNTER — Other Ambulatory Visit (HOSPITAL_BASED_OUTPATIENT_CLINIC_OR_DEPARTMENT_OTHER): Payer: Self-pay

## 2023-10-14 ENCOUNTER — Other Ambulatory Visit: Payer: Self-pay | Admitting: Student

## 2023-10-14 DIAGNOSIS — M542 Cervicalgia: Secondary | ICD-10-CM

## 2023-10-15 ENCOUNTER — Ambulatory Visit
Admission: RE | Admit: 2023-10-15 | Discharge: 2023-10-15 | Disposition: A | Discharge status: Home / Self Care | Source: Ambulatory Visit | Attending: Student | Admitting: Student

## 2023-10-15 DIAGNOSIS — M542 Cervicalgia: Secondary | ICD-10-CM

## 2023-10-25 ENCOUNTER — Ambulatory Visit

## 2023-10-27 ENCOUNTER — Ambulatory Visit
Admission: RE | Admit: 2023-10-27 | Discharge: 2023-10-27 | Disposition: A | Discharge status: Home / Self Care | Source: Ambulatory Visit | Attending: Hematology and Oncology | Admitting: Hematology and Oncology

## 2023-10-27 ENCOUNTER — Ambulatory Visit

## 2023-10-27 ENCOUNTER — Other Ambulatory Visit: Payer: Self-pay | Admitting: Hematology and Oncology

## 2023-10-27 DIAGNOSIS — C50412 Malignant neoplasm of upper-outer quadrant of left female breast: Secondary | ICD-10-CM

## 2023-10-27 DIAGNOSIS — N6311 Unspecified lump in the right breast, upper outer quadrant: Secondary | ICD-10-CM

## 2023-10-28 ENCOUNTER — Ambulatory Visit
Admission: RE | Admit: 2023-10-28 | Discharge: 2023-10-28 | Disposition: A | Discharge status: Home / Self Care | Source: Ambulatory Visit | Attending: Hematology and Oncology | Admitting: Hematology and Oncology

## 2023-10-28 ENCOUNTER — Inpatient Hospital Stay
Admission: RE | Admit: 2023-10-28 | Discharge: 2023-10-28 | Attending: Hematology and Oncology | Admitting: Hematology and Oncology

## 2023-10-28 DIAGNOSIS — N6311 Unspecified lump in the right breast, upper outer quadrant: Secondary | ICD-10-CM

## 2023-10-28 HISTORY — PX: BREAST BIOPSY: SHX20

## 2023-10-29 LAB — SURGICAL PATHOLOGY

## 2023-11-09 ENCOUNTER — Ambulatory Visit: Attending: Hematology and Oncology | Admitting: Rehabilitation

## 2023-11-09 ENCOUNTER — Encounter: Payer: Self-pay | Admitting: Rehabilitation

## 2023-11-09 DIAGNOSIS — Z483 Aftercare following surgery for neoplasm: Secondary | ICD-10-CM | POA: Insufficient documentation

## 2023-11-09 DIAGNOSIS — C50412 Malignant neoplasm of upper-outer quadrant of left female breast: Secondary | ICD-10-CM | POA: Insufficient documentation

## 2023-11-09 NOTE — Therapy (Signed)
 OUTPATIENT PHYSICAL THERAPY SOZO SCREENING NOTE   Patient Name: Gabriela Evans MRN: [id number] DOB:[birth year], 43 y.o., female Today's Date: 11/09/2023  PCP: Patient, No Pcp Per REFERRING PROVIDER: Odean Potts, MD   PT End of Session - 11/09/23 1055     Visit Number 1   screen only   PT Start Time 1050    PT Stop Time 1055    PT Time Calculation (min) 5 min    Activity Tolerance Patient tolerated treatment well    Behavior During Therapy WFL for tasks assessed/performed          Past Medical History:  Diagnosis Date   Anemia    Breast cancer (HCC)    Complication of anesthesia    nausea   Gestational diabetes    Hypothyroidism    Kidney infection    Other abnormal glucose    Gestational diabetes with both children   Personal history of radiation therapy    Pneumonia    01-2019   PONV (postoperative nausea and vomiting)    Past Surgical History:  Procedure Laterality Date   ANTERIOR CERVICAL DECOMP/DISCECTOMY FUSION N/A 07/12/2023   Procedure: ANTERIOR CERVICAL DECOMPRESSION/DISCECTOMY FUSION 3 LEVELS;  Surgeon: Onetha Kuba, MD;  Location: [facility] OR;  Service: Neurosurgery;  Laterality: N/A;  ANTERIOR CERVICAL DECOMPRESSION/DISCECTOMY AND FUSION CERVICAL FOUR-FIVE/CERVICAL FIVE-SIX/CERVICAL SIX-SEVEN   APPENDECTOMY     BREAST BIOPSY Right 10/28/2023   US  RT BREAST BX W LOC DEV 1ST LESION IMG BX SPEC US  GUIDE 10/28/2023 [facility]   BREAST LUMPECTOMY     BREAST LUMPECTOMY WITH RADIOACTIVE SEED AND SENTINEL LYMPH NODE BIOPSY Left 11/29/2020   Procedure: LEFT BREAST LUMPECTOMY WITH RADIOACTIVE SEED AND SENTINEL LYMPH NODE BIOPSY;  Surgeon: Curvin Deward MOULD, MD;  Location: [facility];  Service: General;  Laterality: Left;   CESAREAN SECTION     X2   IUD inserted in OR     LAPAROSCOPIC APPENDECTOMY N/A 07/08/2021   Procedure: APPENDECTOMY LAPAROSCOPIC;  Surgeon: Jordis Laneta FALCON, MD;  Location: [facility] ORS;  Service: General;  Laterality: N/A;   PELVIC  LAPAROSCOPY  2009   remove perforated IUD   ROBOTIC ASSISTED LAPAROSCOPIC LYSIS OF ADHESION N/A 04/26/2019   Procedure: XI ROBOTIC ASSISTED LAPAROSCOPIC LYSIS OF ADHESION;  Surgeon: Lavoie, Marie-Lyne, MD;  Location: [facility];  Service: Gynecology;  Laterality: N/A;   ROBOTIC ASSISTED TOTAL HYSTERECTOMY WITH BILATERAL SALPINGO OOPHERECTOMY Bilateral 04/26/2019   Procedure: XI ROBOTIC ASSISTED TOTAL LAPAROSCOPIC HYSTERECTOMY WITH BILATERAL SALPINGECTOMY;  Surgeon: Lavoie, Marie-Lyne, MD;  Location: [facility];  Service: Gynecology;  Laterality: Bilateral;  requests 8:30 OR start in [place] Gyn block time requests 2 hours   THYROIDECTOMY N/A 05/01/2022   Procedure: TOTAL THYROIDECTOMY, POSSIBLE LEFT THYROID  LOBECTOMY;  Surgeon: Eletha Boas, MD;  Location: [facility] ORS;  Service: General;  Laterality: N/A;   WISDOM TOOTH EXTRACTION     Patient Active Problem List   Diagnosis Date Noted   Spinal stenosis of cervical region 07/12/2023   Neoplasm of uncertain behavior of thyroid  gland 04/28/2022   Dysphagia 04/28/2022   Acute appendicitis 07/08/2021   Genetic testing 11/07/2020   Malignant neoplasm of upper-outer quadrant of left breast in female, estrogen receptor positive (HCC) 10/28/2020   Postoperative state 04/26/2019   Menorrhagia 03/21/2018    REFERRING DIAG: left breast cancer at risk for lymphedema  THERAPY DIAG: Malignant neoplasm of upper-outer quadrant of left female breast, unspecified estrogen receptor status (HCC)  Aftercare following surgery for neoplasm  PERTINENT  HISTORY: Lt lumpectomy with 2 negative lymph nodes removed on 11/29/20.  She underwent a partial hysterectomy (ovaries still remain) in 04/2019. Radiation completed. Hx of antibiotics for possible breast infection   PRECAUTIONS: left UE Lymphedema risk, None  SUBJECTIVE: Pt returns for her last 3 month L-Dex screen.   PAIN:  Are you having pain? No  SOZO SCREENING: Patient was  assessed today using the SOZO machine to determine the lymphedema index score. This was compared to her baseline score. It was determined that she is within the recommended range when compared to her baseline and no further action is needed at this time. She will continue SOZO screenings. These are done every 3 months for 2 years post operatively followed by every 6 months for 2 years, and then annually.   L-DEX FLOWSHEETS - 11/09/23 1000       L-DEX LYMPHEDEMA SCREENING   Measurement Type Unilateral    L-DEX MEASUREMENT EXTREMITY Upper Extremity    POSITION  Standing    DOMINANT SIDE Right    At Risk Side Left    BASELINE SCORE (UNILATERAL) -3.4    L-DEX SCORE (UNILATERAL) -4.9    VALUE CHANGE (UNILAT) -1.5          Begin 6 month L-Dex screens next.   Berwyn Knights, PTA 11/09/23 10:57 AM

## 2023-11-24 ENCOUNTER — Telehealth: Payer: Self-pay

## 2023-11-24 NOTE — Telephone Encounter (Signed)
 Called pt per MD to advise [facility] testing was negative/not detected. Pt verbalized understanding of results and knows [facility] will be in touch to schedule 6 mo repeat lab.

## 2023-11-26 ENCOUNTER — Encounter: Payer: Self-pay | Admitting: Hematology and Oncology

## 2023-12-07 ENCOUNTER — Encounter: Payer: Self-pay | Admitting: Hematology and Oncology

## 2023-12-08 ENCOUNTER — Other Ambulatory Visit: Payer: Self-pay | Admitting: *Deleted

## 2023-12-08 DIAGNOSIS — C439 Malignant melanoma of skin, unspecified: Secondary | ICD-10-CM

## 2023-12-08 NOTE — Progress Notes (Signed)
 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  Pt requesting if we could proceed with PET scan now that she has a second primary diagnosis.  Per MD verbal orders received and placed for PET scan.  Pt notified appt will be scheduled once PA is obtained.

## 2023-12-14 ENCOUNTER — Encounter
Admission: RE | Admit: 2023-12-14 | Discharge: 2023-12-14 | Disposition: A | Discharge status: Home / Self Care | Source: Ambulatory Visit | Attending: Hematology and Oncology | Admitting: Hematology and Oncology

## 2023-12-14 DIAGNOSIS — C439 Malignant melanoma of skin, unspecified: Secondary | ICD-10-CM | POA: Insufficient documentation

## 2023-12-14 LAB — GLUCOSE, CAPILLARY: Glucose-Capillary: 100 mg/dL — ABNORMAL HIGH (ref 70–99)

## 2023-12-14 MED ORDER — FLUDEOXYGLUCOSE F - 18 (FDG) INJECTION
11.1300 | Freq: Once | INTRAVENOUS | Status: AC | PRN
  Administered 2023-12-14: 11.13 via INTRAVENOUS

## 2023-12-15 ENCOUNTER — Ambulatory Visit

## 2023-12-21 ENCOUNTER — Inpatient Hospital Stay: Attending: Hematology and Oncology | Admitting: Hematology and Oncology

## 2023-12-21 VITALS — BP 125/77 | HR 82 | Temp 97.9°F | Resp 18 | Ht 68.0 in | Wt 214.4 lb

## 2023-12-21 DIAGNOSIS — Z85850 Personal history of malignant neoplasm of thyroid: Secondary | ICD-10-CM | POA: Insufficient documentation

## 2023-12-21 DIAGNOSIS — Z170 Estrogen receptor positive status [ER+]: Secondary | ICD-10-CM | POA: Diagnosis not present

## 2023-12-21 DIAGNOSIS — Z1721 Progesterone receptor positive status: Secondary | ICD-10-CM | POA: Diagnosis not present

## 2023-12-21 DIAGNOSIS — Z923 Personal history of irradiation: Secondary | ICD-10-CM | POA: Insufficient documentation

## 2023-12-21 DIAGNOSIS — Z1732 Human epidermal growth factor receptor 2 negative status: Secondary | ICD-10-CM | POA: Diagnosis not present

## 2023-12-21 DIAGNOSIS — C50412 Malignant neoplasm of upper-outer quadrant of left female breast: Secondary | ICD-10-CM | POA: Insufficient documentation

## 2023-12-21 NOTE — Assessment & Plan Note (Signed)
 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 Oncotype DX: Low risk no role of chemo 2. Adjuvant radiation therapy completed 03/03/2021 3. Adjuvant antiestrogen therapy started 03/19/2021 discontinued 05/03/2021 (allergic reaction to Tamoxifen ) -------------------------------------------------------------------------------------------------------------- Breast cancer surveillance: Breast MRI 04/20/2023: Benign breast density category D, being done annually because of high density Mammogram 10/27/2023: Indeterminate 1.7 cm right breast mass corresponding to the area of palpable thickening and tenderness, biopsy: 10/28/2023: Benign Breast exam 12/04/2021: Benign PET/CT 12/20/2023: No evidence of metastatic disease [facility] reveal 11/16/2023: 0%   Multiple psychomotor symptoms especially brief lapses in focus and concentration/memory, etc.: Much improved after stopping antiestrogen therapy Severe hot flashes: Much improved after stopping antiestrogen therapy   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  Planning to put her in regular school next year. Recommended circulating tumor DNA testing to evaluate for MRD.  Melanoma: Being seen by [facility] dermatology PET CT scan 12/20/2023: No evidence of metastatic disease

## 2023-12-21 NOTE — Progress Notes (Signed)
 Patient Care Team: Patient, No Pcp Per as PCP - General (General Practice) Curvin Deward MOULD, MD as Consulting Physician (General Surgery) Odean Potts, MD as Consulting Physician (Hematology and Oncology) Dewey Rush, MD as Consulting Physician (Radiation Oncology)  DIAGNOSIS:  Encounter Diagnosis  Name Primary?   Malignant neoplasm of upper-outer quadrant of left breast in female, estrogen receptor positive (HCC) Yes    SUMMARY OF ONCOLOGIC HISTORY: Oncology History  Malignant neoplasm of upper-outer quadrant of left breast in female, estrogen receptor positive (HCC)  10/28/2020 Initial Diagnosis   Presented with new focal thickening and pain her outer left breast. Diagnostic mammogram and US : indeterminate mass in the left breast at 3:00. 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  Breast MRI: 1 prominent lymph node and 9 mm mass     11/06/2020 Genetic Testing   Negative hereditary cancer genetic testing: no pathogenic variants detected in [facility] BRCAPlus Panel and [facility] CancerNext-Expanded +RNAinsight Panel. The report dates are November 06, 2020 and November 10, 2020, respectively.   The BRCAplus panel offered by [employer] and includes sequencing and deletion/duplication analysis for the following 8 genes: ATM, BRCA1, BRCA2, CDH1, CHEK2, PALB2, PTEN, and TP53.  The CancerNext-Expanded gene panel offered by [facility] and includes sequencing, rearrangement, and RNA analysis for the following 77 genes: AIP, ALK, APC, ATM, AXIN2, BAP1, BARD1, BLM, BMPR1A, BRCA1, BRCA2, BRIP1, CDC73, CDH1, CDK4, CDKN1B, CDKN2A, CHEK2, CTNNA1, DICER1, FANCC, FH, FLCN, GALNT12, KIF1B, LZTR1, MAX, MEN1, MET, MLH1, MSH2, MSH3, MSH6, MUTYH, NBN, NF1, NF2, NTHL1, PALB2, PHOX2B, PMS2, POT1, PRKAR1A, PTCH1, PTEN, RAD51C, RAD51D, RB1, RECQL, RET, SDHA, SDHAF2, SDHB, SDHC, SDHD, SMAD4, SMARCA4, SMARCB1, SMARCE1, STK11, SUFU, TMEM127, TP53, TSC1,  TSC2, VHL and XRCC2 (sequencing and deletion/duplication); EGFR, EGLN1, HOXB13, KIT, MITF, PDGFRA, POLD1, and POLE (sequencing only); EPCAM and GREM1 (deletion/duplication only).    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, margins negative, ER 95%, PR 100%, HER2 negative, Ki-67 10%    12/11/2020 Oncotype testing   Oncotype score: 20, distant recurrence at 9 years: 6%   01/14/2021 - 03/03/2021 Radiation Therapy     Site Technique Total Dose (Gy) Dose per Fx (Gy) Completed Fx Beam Energies  Breast, Left: Breast_L 3D 50.4/50.4 1.8 28/28 6X  Breast, Left: Breast_L_Bst 3D 10/10 2 5/5 6X      03/2021 - 04/2021 Anti-estrogen oral therapy   Tamoxifen  daily--unable to tolerate     CHIEF COMPLIANT: Follow-up of recent history of melanoma  HISTORY OF PRESENT ILLNESS: History of Present Illness Gabriela Evans is a 43 year old female with a history of breast and thyroid  cancer who presents with melanoma on the face.  During a routine visit at [facility], a superficial melanoma was identified on her face. No lymph node sampling was done. She is awaiting Mohs surgery for local control.  She had a PET scan that showed no significant findings related to melanoma or other cancers. Genetic testing including BRCA was negative. She is distressed by having a third cancer within three years.  In June she had a triple fusion in her neck, which she attributes to prior radiation. Her neck symptoms improved with physical therapy and dry needling.  She previously had left-sided lymphedema that is now resolved. She undergoes Sozo screening every six months. She stopped tamoxifen  two years ago due to hormonal sensitivity and emotional side effects.  Her grandmother had primary melanoma, which prompted  evaluation of the facial lesion initially thought to be rosacea before biopsy confirmed melanoma.  She reports an enlarged heart on  imaging, without pericardial or pleural effusion.  She is a mother of four and balances work with family responsibilities. She continues to work occasionally at [employer].     ALLERGIES:  is allergic to nickel, tramadol , oxycodone -acetaminophen , and tamoxifen .  MEDICATIONS:  Current Outpatient Medications  Medication Sig Dispense Refill   cetirizine (ZYRTEC) 10 MG tablet Take 10 mg by mouth daily.     ciclopirox  (PENLAC ) 8 % solution Apply 1 Application topically daily as needed (Fungus). (Patient not taking: Reported on 12/21/2023)     Docusate Calcium (STOOL SOFTENER PO) Take 1 capsule by mouth daily as needed (Constipation). (Patient not taking: Reported on 12/21/2023)     HYDROcodone -acetaminophen  (HYCET) 7.5-325 mg/15 ml solution Take 20 mLs by mouth every 4 (four) hours as needed for severe pain (pain score 7-10). (Patient not taking: Reported on 12/21/2023) 120 mL 0   ibuprofen  (ADVIL ) 200 MG tablet Take 200 mg by mouth as needed for moderate pain (pain score 4-6). (Patient not taking: Reported on 12/21/2023)     levothyroxine  (SYNTHROID ) 88 MCG tablet Take 88 mcg by mouth every morning. (Patient not taking: Reported on 12/21/2023)     Turmeric (QC TUMERIC COMPLEX PO) Take 1 tablet by mouth daily. (Patient not taking: Reported on 12/21/2023)     VITAMIN D -VITAMIN K  PO Take 1 tablet by mouth daily. (Patient not taking: Reported on 12/21/2023)     VITAMIN E  PO Take 1 tablet by mouth daily. (Patient not taking: Reported on 12/21/2023)     No current facility-administered medications for this visit.    PHYSICAL EXAMINATION: ECOG PERFORMANCE STATUS: 1 - Symptomatic but completely ambulatory  Vitals:   12/21/23 1311  BP: 125/77  Pulse: 82  Resp: 18  Temp: 97.9 F (36.6 C)  SpO2: 100%   Filed Weights   12/21/23 1311  Weight: 214 lb 6.4 oz (97.3 kg)    Physical Exam   (exam performed in the presence of a chaperone)  LABORATORY DATA:  I have reviewed the data as listed    Latest Ref  Rng & Units 04/29/2023    8:58 AM 07/28/2022    4:36 PM 07/08/2021    8:44 AM  CMP  Glucose 70 - 99 mg/dL 83   895   BUN 6 - 20 mg/dL 13   13   Creatinine 9.55 - 1.00 mg/dL 9.24  9.09  9.35   Sodium 135 - 145 mmol/L 139   141   Potassium 3.5 - 5.1 mmol/L 3.9   4.0   Chloride 98 - 111 mmol/L 106   106   CO2 22 - 32 mmol/L 28   27   Calcium 8.9 - 10.3 mg/dL 9.5   9.4   Total Protein 6.5 - 8.1 g/dL 7.5   7.3   Total Bilirubin 0.0 - 1.2 mg/dL 0.6   0.9   Alkaline Phos 38 - 126 U/L 46   60   AST 15 - 41 U/L 13   15   ALT 0 - 44 U/L 14   17     Lab Results  Component Value Date   WBC 7.7 07/08/2023   HGB 13.0 07/08/2023   HCT 40.2 07/08/2023   MCV 92.8 07/08/2023   PLT 318 07/08/2023   NEUTROABS 4.1 04/29/2023    ASSESSMENT & PLAN:  Malignant neoplasm of upper-outer quadrant of left breast in female,  estrogen receptor positive (HCC) 11/29/2020: 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 Oncotype DX: Low risk no role of chemo 2. Adjuvant radiation therapy completed 03/03/2021 3. Adjuvant antiestrogen therapy started 03/19/2021 discontinued 05/03/2021 (allergic reaction to Tamoxifen ) -------------------------------------------------------------------------------------------------------------- Breast cancer surveillance: Breast MRI 04/20/2023: Benign breast density category D, being done annually because of high density Mammogram 10/27/2023: Indeterminate 1.7 cm right breast mass corresponding to the area of palpable thickening and tenderness, biopsy: 10/28/2023: Benign Breast exam 12/04/2021: Benign PET/CT 12/20/2023: No evidence of metastatic disease Guardant reveal 11/16/2023: 0%   Multiple psychomotor symptoms especially brief lapses in focus and concentration/memory, etc.: Much improved after stopping  antiestrogen therapy Severe hot flashes: Much improved after stopping antiestrogen therapy   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  Planning to put her in regular school next year. Guardant reveal for MRD testing: Negative  Melanoma on the right side of the face: Being seen by [facility] dermatology, she is awaiting Mohs surgery PET CT scan 12/20/2023: No evidence of metastatic disease Cardiomegaly noted on PET CT scan: Will order an echocardiogram.  Assessment & Plan Malignant melanoma of the face Superficial melanoma under dermatological care. No lymph node sampling needed. PET scan report clear except for mild arthritis. Family history of melanoma noted. - Continue dermatological follow-up every three months for a year, then every six months indefinitely.  Estrogen receptor positive breast cancer in remission Breast cancer in remission, no current evidence of disease. Tamoxifen  discontinued due to hormonal sensitivity. Currently in the garden reveal for breast cancer surveillance. - Continue garden reveal for breast cancer surveillance. - MRI scheduled for April.  Cardiomegaly Further evaluation needed. - Ordered echocardiogram to assess cardiac function.      No orders of the defined types were placed in this encounter.  The patient has a good understanding of the overall plan. she agrees with it. she will call with any problems that may develop before the next visit here.  I personally spent a total of 30 minutes in the care of the patient today including preparing to see the patient, getting/reviewing separately obtained history, performing a medically appropriate exam/evaluation, counseling and educating, placing orders, referring and communicating with other health  care professionals, documenting clinical information in the EHR, independently interpreting results, communicating results, and coordinating care.   Viinay K Genea Rheaume, MD 12/21/23

## 2023-12-30 ENCOUNTER — Ambulatory Visit (HOSPITAL_COMMUNITY)

## 2024-01-06 ENCOUNTER — Ambulatory Visit (HOSPITAL_COMMUNITY)

## 2024-01-06 DIAGNOSIS — Z01818 Encounter for other preprocedural examination: Secondary | ICD-10-CM | POA: Diagnosis present

## 2024-01-06 DIAGNOSIS — C50412 Malignant neoplasm of upper-outer quadrant of left female breast: Secondary | ICD-10-CM | POA: Diagnosis not present

## 2024-01-06 DIAGNOSIS — Z170 Estrogen receptor positive status [ER+]: Secondary | ICD-10-CM | POA: Insufficient documentation

## 2024-01-06 DIAGNOSIS — I517 Cardiomegaly: Secondary | ICD-10-CM | POA: Diagnosis not present

## 2024-01-06 LAB — ECHOCARDIOGRAM COMPLETE
AR max vel: 2.92 cm2
AV Area VTI: 2.97 cm2
AV Area mean vel: 2.86 cm2
AV Mean grad: 5 mmHg
AV Peak grad: 10 mmHg
Ao pk vel: 1.58 m/s
Area-P 1/2: 3.77 cm2
Calc EF: 64.4 %
S' Lateral: 3.4 cm
Single Plane A2C EF: 64.2 %
Single Plane A4C EF: 64.4 %

## 2024-01-09 ENCOUNTER — Encounter: Payer: Self-pay | Admitting: Hematology and Oncology

## 2024-04-20 ENCOUNTER — Other Ambulatory Visit

## 2024-04-27 ENCOUNTER — Ambulatory Visit: Attending: Hematology and Oncology

## 2024-04-27 ENCOUNTER — Ambulatory Visit: Admitting: Hematology and Oncology
